# Patient Record
Sex: Male | Born: 1935
Health system: Southern US, Community
[De-identification: ages and names within clinical notes are randomized; demographics above are authoritative.]

## PROBLEM LIST (undated history)

## (undated) DIAGNOSIS — F419 Anxiety disorder, unspecified: Secondary | ICD-10-CM

## (undated) DIAGNOSIS — M545 Low back pain, unspecified: Secondary | ICD-10-CM

## (undated) DIAGNOSIS — N400 Enlarged prostate without lower urinary tract symptoms: Secondary | ICD-10-CM

## (undated) DIAGNOSIS — M1990 Unspecified osteoarthritis, unspecified site: Secondary | ICD-10-CM

## (undated) DIAGNOSIS — I5030 Unspecified diastolic (congestive) heart failure: Secondary | ICD-10-CM

## (undated) DIAGNOSIS — F32A Depression, unspecified: Secondary | ICD-10-CM

## (undated) DIAGNOSIS — E669 Obesity, unspecified: Secondary | ICD-10-CM

## (undated) DIAGNOSIS — R945 Abnormal results of liver function studies: Secondary | ICD-10-CM

## (undated) DIAGNOSIS — I495 Sick sinus syndrome: Secondary | ICD-10-CM

## (undated) DIAGNOSIS — K449 Diaphragmatic hernia without obstruction or gangrene: Secondary | ICD-10-CM

## (undated) DIAGNOSIS — F329 Major depressive disorder, single episode, unspecified: Secondary | ICD-10-CM

## (undated) DIAGNOSIS — R7989 Other specified abnormal findings of blood chemistry: Secondary | ICD-10-CM

## (undated) DIAGNOSIS — I6381 Other cerebral infarction due to occlusion or stenosis of small artery: Secondary | ICD-10-CM

## (undated) DIAGNOSIS — G8929 Other chronic pain: Secondary | ICD-10-CM

## (undated) DIAGNOSIS — I10 Essential (primary) hypertension: Secondary | ICD-10-CM

## (undated) DIAGNOSIS — Z9989 Dependence on other enabling machines and devices: Secondary | ICD-10-CM

## (undated) DIAGNOSIS — D649 Anemia, unspecified: Secondary | ICD-10-CM

## (undated) DIAGNOSIS — I2510 Atherosclerotic heart disease of native coronary artery without angina pectoris: Secondary | ICD-10-CM

## (undated) DIAGNOSIS — G4733 Obstructive sleep apnea (adult) (pediatric): Secondary | ICD-10-CM

## (undated) DIAGNOSIS — M810 Age-related osteoporosis without current pathological fracture: Secondary | ICD-10-CM

## (undated) HISTORY — DX: Anxiety disorder, unspecified: F41.9

## (undated) HISTORY — DX: Unspecified diastolic (congestive) heart failure: I50.30

## (undated) HISTORY — DX: Diaphragmatic hernia without obstruction or gangrene: K44.9

## (undated) HISTORY — DX: Essential (primary) hypertension: I10

## (undated) HISTORY — DX: Anemia, unspecified: D64.9

## (undated) HISTORY — DX: Other specified abnormal findings of blood chemistry: R79.89

## (undated) HISTORY — DX: Obesity, unspecified: E66.9

## (undated) HISTORY — DX: Abnormal results of liver function studies: R94.5

---

## 1996-12-26 HISTORY — PX: CARDIAC CATHETERIZATION: SHX172

## 1999-02-03 ENCOUNTER — Encounter: Admission: RE | Admit: 1999-02-03 | Discharge: 1999-02-03 | Payer: Self-pay | Admitting: Family Medicine

## 1999-02-03 ENCOUNTER — Ambulatory Visit (HOSPITAL_COMMUNITY): Admission: RE | Admit: 1999-02-03 | Discharge: 1999-02-03 | Payer: Self-pay | Admitting: Family Medicine

## 1999-02-15 ENCOUNTER — Ambulatory Visit (HOSPITAL_COMMUNITY): Admission: RE | Admit: 1999-02-15 | Discharge: 1999-02-15 | Payer: Self-pay | Admitting: *Deleted

## 1999-03-02 ENCOUNTER — Encounter: Admission: RE | Admit: 1999-03-02 | Discharge: 1999-03-02 | Payer: Self-pay | Admitting: Sports Medicine

## 1999-07-09 ENCOUNTER — Encounter: Admission: RE | Admit: 1999-07-09 | Discharge: 1999-07-09 | Payer: Self-pay | Admitting: Family Medicine

## 1999-09-08 ENCOUNTER — Encounter: Admission: RE | Admit: 1999-09-08 | Discharge: 1999-09-08 | Payer: Self-pay | Admitting: Family Medicine

## 2000-03-13 ENCOUNTER — Encounter: Admission: RE | Admit: 2000-03-13 | Discharge: 2000-03-13 | Payer: Self-pay | Admitting: Family Medicine

## 2000-04-25 ENCOUNTER — Encounter: Admission: RE | Admit: 2000-04-25 | Discharge: 2000-04-25 | Payer: Self-pay | Admitting: Sports Medicine

## 2000-05-03 ENCOUNTER — Encounter: Payer: Self-pay | Admitting: *Deleted

## 2000-05-03 ENCOUNTER — Encounter: Admission: RE | Admit: 2000-05-03 | Discharge: 2000-05-03 | Payer: Self-pay | Admitting: *Deleted

## 2000-07-05 ENCOUNTER — Encounter: Admission: RE | Admit: 2000-07-05 | Discharge: 2000-07-05 | Payer: Self-pay | Admitting: Family Medicine

## 2000-07-20 ENCOUNTER — Encounter: Admission: RE | Admit: 2000-07-20 | Discharge: 2000-07-20 | Payer: Self-pay | Admitting: Family Medicine

## 2000-10-30 ENCOUNTER — Encounter: Admission: RE | Admit: 2000-10-30 | Discharge: 2000-10-30 | Payer: Self-pay | Admitting: Family Medicine

## 2000-11-20 ENCOUNTER — Encounter: Admission: RE | Admit: 2000-11-20 | Discharge: 2000-11-20 | Payer: Self-pay | Admitting: Family Medicine

## 2000-12-07 ENCOUNTER — Encounter: Admission: RE | Admit: 2000-12-07 | Discharge: 2000-12-07 | Payer: Self-pay | Admitting: Family Medicine

## 2001-05-16 ENCOUNTER — Ambulatory Visit (HOSPITAL_COMMUNITY): Admission: RE | Admit: 2001-05-16 | Discharge: 2001-05-16 | Payer: Self-pay | Admitting: Family Medicine

## 2001-05-16 ENCOUNTER — Encounter: Admission: RE | Admit: 2001-05-16 | Discharge: 2001-05-16 | Payer: Self-pay | Admitting: Family Medicine

## 2001-06-01 ENCOUNTER — Encounter: Payer: Self-pay | Admitting: Family Medicine

## 2001-06-01 ENCOUNTER — Ambulatory Visit (HOSPITAL_COMMUNITY): Admission: RE | Admit: 2001-06-01 | Discharge: 2001-06-01 | Payer: Self-pay | Admitting: *Deleted

## 2001-06-04 ENCOUNTER — Encounter: Admission: RE | Admit: 2001-06-04 | Discharge: 2001-06-04 | Payer: Self-pay | Admitting: Family Medicine

## 2001-07-06 ENCOUNTER — Encounter: Admission: RE | Admit: 2001-07-06 | Discharge: 2001-07-06 | Payer: Self-pay | Admitting: Family Medicine

## 2001-07-09 ENCOUNTER — Encounter: Payer: Self-pay | Admitting: Sports Medicine

## 2001-07-09 ENCOUNTER — Encounter: Admission: RE | Admit: 2001-07-09 | Discharge: 2001-07-09 | Payer: Self-pay | Admitting: Sports Medicine

## 2001-09-03 ENCOUNTER — Encounter: Admission: RE | Admit: 2001-09-03 | Discharge: 2001-09-03 | Payer: Self-pay | Admitting: Family Medicine

## 2001-09-17 ENCOUNTER — Encounter: Admission: RE | Admit: 2001-09-17 | Discharge: 2001-09-17 | Payer: Self-pay | Admitting: Family Medicine

## 2001-10-01 ENCOUNTER — Encounter: Admission: RE | Admit: 2001-10-01 | Discharge: 2001-10-01 | Payer: Self-pay | Admitting: Family Medicine

## 2001-10-09 ENCOUNTER — Encounter: Admission: RE | Admit: 2001-10-09 | Discharge: 2001-10-09 | Payer: Self-pay | Admitting: Family Medicine

## 2001-10-29 ENCOUNTER — Encounter: Admission: RE | Admit: 2001-10-29 | Discharge: 2001-10-29 | Payer: Self-pay | Admitting: Family Medicine

## 2001-11-15 ENCOUNTER — Encounter: Payer: Self-pay | Admitting: Emergency Medicine

## 2001-11-15 ENCOUNTER — Emergency Department (HOSPITAL_COMMUNITY): Admission: EM | Admit: 2001-11-15 | Discharge: 2001-11-15 | Payer: Self-pay | Admitting: Emergency Medicine

## 2001-12-07 ENCOUNTER — Encounter: Admission: RE | Admit: 2001-12-07 | Discharge: 2001-12-07 | Payer: Self-pay | Admitting: Family Medicine

## 2001-12-17 ENCOUNTER — Encounter: Payer: Self-pay | Admitting: Internal Medicine

## 2001-12-17 ENCOUNTER — Ambulatory Visit (HOSPITAL_COMMUNITY): Admission: RE | Admit: 2001-12-17 | Discharge: 2001-12-17 | Payer: Self-pay | Admitting: Internal Medicine

## 2001-12-20 ENCOUNTER — Encounter: Admission: RE | Admit: 2001-12-20 | Discharge: 2001-12-20 | Payer: Self-pay | Admitting: Family Medicine

## 2002-01-29 ENCOUNTER — Ambulatory Visit (HOSPITAL_BASED_OUTPATIENT_CLINIC_OR_DEPARTMENT_OTHER): Admission: RE | Admit: 2002-01-29 | Discharge: 2002-01-29 | Payer: Self-pay | Admitting: Internal Medicine

## 2002-02-20 ENCOUNTER — Encounter: Admission: RE | Admit: 2002-02-20 | Discharge: 2002-02-20 | Payer: Self-pay | Admitting: Family Medicine

## 2002-05-16 ENCOUNTER — Encounter: Admission: RE | Admit: 2002-05-16 | Discharge: 2002-05-16 | Payer: Self-pay | Admitting: Family Medicine

## 2002-05-27 ENCOUNTER — Encounter: Admission: RE | Admit: 2002-05-27 | Discharge: 2002-05-27 | Payer: Self-pay | Admitting: Family Medicine

## 2002-09-30 ENCOUNTER — Encounter: Admission: RE | Admit: 2002-09-30 | Discharge: 2002-09-30 | Payer: Self-pay | Admitting: Sports Medicine

## 2003-03-20 ENCOUNTER — Encounter: Admission: RE | Admit: 2003-03-20 | Discharge: 2003-03-20 | Payer: Self-pay | Admitting: Family Medicine

## 2003-04-11 ENCOUNTER — Encounter: Admission: RE | Admit: 2003-04-11 | Discharge: 2003-04-11 | Payer: Self-pay | Admitting: Family Medicine

## 2003-09-08 ENCOUNTER — Other Ambulatory Visit: Admission: RE | Admit: 2003-09-08 | Discharge: 2003-09-08 | Payer: Self-pay | Admitting: Dermatology

## 2003-12-08 ENCOUNTER — Encounter: Admission: RE | Admit: 2003-12-08 | Discharge: 2003-12-08 | Payer: Self-pay | Admitting: Family Medicine

## 2004-04-01 ENCOUNTER — Encounter: Admission: RE | Admit: 2004-04-01 | Discharge: 2004-04-01 | Payer: Self-pay | Admitting: Family Medicine

## 2004-04-29 ENCOUNTER — Encounter: Admission: RE | Admit: 2004-04-29 | Discharge: 2004-04-29 | Payer: Self-pay | Admitting: Family Medicine

## 2004-05-28 ENCOUNTER — Encounter: Admission: RE | Admit: 2004-05-28 | Discharge: 2004-05-28 | Payer: Self-pay | Admitting: Sports Medicine

## 2004-07-01 ENCOUNTER — Encounter: Admission: RE | Admit: 2004-07-01 | Discharge: 2004-07-01 | Payer: Self-pay | Admitting: Family Medicine

## 2004-08-02 ENCOUNTER — Encounter: Admission: RE | Admit: 2004-08-02 | Discharge: 2004-08-02 | Payer: Self-pay | Admitting: Family Medicine

## 2005-01-19 ENCOUNTER — Ambulatory Visit: Payer: Self-pay | Admitting: Internal Medicine

## 2005-05-31 ENCOUNTER — Ambulatory Visit: Payer: Self-pay | Admitting: Internal Medicine

## 2005-07-07 ENCOUNTER — Other Ambulatory Visit: Admission: RE | Admit: 2005-07-07 | Discharge: 2005-07-07 | Payer: Self-pay | Admitting: Dermatology

## 2005-11-29 ENCOUNTER — Ambulatory Visit: Payer: Self-pay | Admitting: Internal Medicine

## 2005-11-30 ENCOUNTER — Ambulatory Visit: Payer: Self-pay | Admitting: *Deleted

## 2006-05-30 ENCOUNTER — Ambulatory Visit: Payer: Self-pay | Admitting: Internal Medicine

## 2006-11-27 ENCOUNTER — Ambulatory Visit: Payer: Self-pay | Admitting: Internal Medicine

## 2007-02-22 DIAGNOSIS — K449 Diaphragmatic hernia without obstruction or gangrene: Secondary | ICD-10-CM | POA: Insufficient documentation

## 2007-02-22 DIAGNOSIS — I10 Essential (primary) hypertension: Secondary | ICD-10-CM

## 2007-02-22 DIAGNOSIS — D509 Iron deficiency anemia, unspecified: Secondary | ICD-10-CM

## 2007-02-22 DIAGNOSIS — F411 Generalized anxiety disorder: Secondary | ICD-10-CM | POA: Insufficient documentation

## 2007-02-22 DIAGNOSIS — E669 Obesity, unspecified: Secondary | ICD-10-CM

## 2007-11-13 DIAGNOSIS — R0602 Shortness of breath: Secondary | ICD-10-CM

## 2007-11-13 DIAGNOSIS — J309 Allergic rhinitis, unspecified: Secondary | ICD-10-CM | POA: Insufficient documentation

## 2007-11-13 DIAGNOSIS — G4733 Obstructive sleep apnea (adult) (pediatric): Secondary | ICD-10-CM

## 2007-12-18 ENCOUNTER — Encounter: Payer: Self-pay | Admitting: Internal Medicine

## 2007-12-19 ENCOUNTER — Ambulatory Visit: Payer: Self-pay | Admitting: Internal Medicine

## 2008-12-01 ENCOUNTER — Ambulatory Visit: Payer: Self-pay | Admitting: Internal Medicine

## 2008-12-26 HISTORY — PX: CATARACT EXTRACTION W/ INTRAOCULAR LENS  IMPLANT, BILATERAL: SHX1307

## 2009-05-26 ENCOUNTER — Encounter: Admission: RE | Admit: 2009-05-26 | Discharge: 2009-05-26 | Payer: Self-pay | Admitting: Orthopedic Surgery

## 2009-09-01 ENCOUNTER — Telehealth: Payer: Self-pay | Admitting: Internal Medicine

## 2009-09-02 ENCOUNTER — Encounter: Payer: Self-pay | Admitting: Internal Medicine

## 2009-09-14 ENCOUNTER — Emergency Department (HOSPITAL_COMMUNITY): Admission: EM | Admit: 2009-09-14 | Discharge: 2009-09-14 | Payer: Self-pay | Admitting: Emergency Medicine

## 2009-12-15 ENCOUNTER — Ambulatory Visit: Payer: Self-pay | Admitting: Internal Medicine

## 2009-12-17 ENCOUNTER — Telehealth: Payer: Self-pay | Admitting: Internal Medicine

## 2009-12-23 ENCOUNTER — Encounter: Payer: Self-pay | Admitting: Internal Medicine

## 2009-12-28 ENCOUNTER — Telehealth (INDEPENDENT_AMBULATORY_CARE_PROVIDER_SITE_OTHER): Payer: Self-pay | Admitting: *Deleted

## 2010-01-06 ENCOUNTER — Encounter: Payer: Self-pay | Admitting: Internal Medicine

## 2010-02-01 ENCOUNTER — Ambulatory Visit: Payer: Self-pay | Admitting: Cardiology

## 2010-02-01 ENCOUNTER — Encounter: Payer: Self-pay | Admitting: Cardiology

## 2010-02-02 ENCOUNTER — Encounter: Payer: Self-pay | Admitting: Cardiology

## 2010-02-04 ENCOUNTER — Encounter: Payer: Self-pay | Admitting: Cardiology

## 2010-02-15 ENCOUNTER — Encounter: Payer: Self-pay | Admitting: Cardiology

## 2010-02-17 ENCOUNTER — Ambulatory Visit: Payer: Self-pay | Admitting: Cardiology

## 2010-02-19 DIAGNOSIS — I10 Essential (primary) hypertension: Secondary | ICD-10-CM | POA: Insufficient documentation

## 2010-02-19 DIAGNOSIS — R079 Chest pain, unspecified: Secondary | ICD-10-CM | POA: Insufficient documentation

## 2010-02-19 DIAGNOSIS — E876 Hypokalemia: Secondary | ICD-10-CM | POA: Insufficient documentation

## 2010-02-19 DIAGNOSIS — E119 Type 2 diabetes mellitus without complications: Secondary | ICD-10-CM | POA: Insufficient documentation

## 2010-02-19 DIAGNOSIS — I5030 Unspecified diastolic (congestive) heart failure: Secondary | ICD-10-CM | POA: Insufficient documentation

## 2010-02-22 ENCOUNTER — Ambulatory Visit: Payer: Self-pay | Admitting: Cardiology

## 2010-02-22 DIAGNOSIS — I509 Heart failure, unspecified: Secondary | ICD-10-CM | POA: Insufficient documentation

## 2010-12-15 ENCOUNTER — Ambulatory Visit: Payer: Self-pay | Admitting: Internal Medicine

## 2011-01-27 ENCOUNTER — Encounter: Payer: Self-pay | Admitting: Internal Medicine

## 2011-01-27 NOTE — Letter (Signed)
Summary: LMN for CPAP Supplies/[facility]  LMN for CPAP Supplies/[facility]   Imported By: Sherian Rein 01/07/2010 11:50:52  _____________________________________________________________________  External Attachment:    Type:   Image     Comment:   External Document

## 2011-01-27 NOTE — Assessment & Plan Note (Signed)
Summary: NPH D/C [facility] 2/10   Visit Type:  Follow-up Primary Provider:  Dr. Doreen Beam  CC:  follow-up visit.  History of Present Illness: 77 year old Stafford recently admitted with complaints of chest pain. He ruled out for myocardial infarction with serial enzymes. An echocardiogram in February of 2011 showed normal LV function and left atrial enlargement. Note his liver functions were significantly elevated. It was felt that GI evaluation was warranted and receiving with an outpatient Myoview for risk stratification. This was performed on February 17, 2010. There was a fixed inferoseptal defect with normal wall motion but no ischemia. Ejection fraction was 73%. Since then he has dyspnea with more extreme activities but not with routine activities. It is relieved with rest. There is no associated chest pain. There is no orthopnea, PND, pedal edema or exertional chest pain. There is no syncope.  Preventive Screening-Counseling & Management  Alcohol-Tobacco     Smoking Status: never  Current Medications (verified): 1)  Enalapril Maleate 20 Mg  Tabs (Enalapril Maleate) .... Take 1 Tablet By Mouth Two Times A Day 2)  Flomax 0.4 Mg  Cp24 (Tamsulosin Hcl) .... Take 1 Tablet By Mouth Once A Day 3)  Multivitamins   Tabs (Multiple Vitamin) .... Take 1 By Mouth Once Daily 4)  Fish Oil 1000 Mg Caps (Omega-3 Fatty Acids) .... Take 1 Capsule By Mouth Two Times A Day 5)  Aspirin Ec Low Strength 81 Mg  Tbec (Aspirin) .... Take 1 By Mouth Once Daily 6)  Furosemide 40 Mg Tabs (Furosemide) .... Take 1/2 Tab By Mouth Daily 7)  Amlodipine Besylate 5 Mg Tabs (Amlodipine Besylate) .... Take One Tablet By Mouth Daily  Allergies: No Known Drug Allergies  Comments:  Nurse/Medical Assistant: The patient's medications were reviewed with the patient and were updated in the Medication List. Pt brought medication bottles to office visit.  Cyril Loosen, RN, BSN (February 22, 2010 10:56 AM)  Past History:  Past  Medical History: BMET 6/03 wnl, Hiatel hernia, hydrocele, left, pancreatitis, remote history, Rupture of foot tendon summer 2001   ALLERGIC RHINITIS (ICD-477.9) OBSTRUCTIVE SLEEP APNEA (ICD-327.23) OBESITY, NOS (ICD-278.00) HYPERTENSION, BENIGN SYSTEMIC (ICD-401.1) HERNIA, HIATAL, NONCONGENITAL (ICD-553.3) ANXIETY (ICD-300.00) ANEMIA, IRON DEFICIENCY, UNSPEC. (ICD-280.9) elevated liver function tests Diastolic congestive heart failure Diabetes mellitus  Social History: Reviewed history from 12/01/2008 and no changes required. 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  Married with 2 children  Review of Systems       no fevers or chills, productive cough, hemoptysis, dysphasia, odynophagia, melena, hematochezia, dysuria, hematuria, rash, seizure activity, orthopnea, PND, pedal edema, claudication. Remaining systems are negative.   Vital Signs:  Patient profile:   77 year old Stafford Height:      Frank inches Weight:      194.25 pounds Pulse rate:   80 / minute BP sitting:   159 / 90  (left arm) Cuff size:   large  Vitals Entered By: Cyril Loosen, RN, BSN (February 22, 2010 10:51 AM) CC: follow-up visit   Physical Exam  General:  Well-developed well-nourished in no acute distress.  Skin is warm and dry.  HEENT is normal.  Neck is supple. No thyromegaly.  Chest is clear to auscultation with normal expansion.  Cardiovascular exam  is regular rate and rhythm.  Abdominal exam nontender or distended. No masses palpated. Extremities show no edema. neuro grossly intact    Impression & Recommendations:  Problem # 1:  CHF (ICD-428.0) Patient has mild diastolic congestive heart failure. I will  decrease his Lasix to 20 mg p.o. daily. Check be met in one week. If he has worsening shortness of breath or volume excess then we can resume previous dose. The following medications were removed from the medication list:    Hydrochlorothiazide 25 Mg Tabs (Hydrochlorothiazide) .Marland Kitchen... Take 1 tablet by mouth once a day His updated medication list for this problem includes:    Enalapril Maleate 20 Mg Tabs (Enalapril maleate) .Marland Kitchen... Take 1 tablet by mouth two times a day    Aspirin Ec Low Strength 81 Mg Tbec (Aspirin) .Marland Kitchen... Take 1 by mouth once daily    Furosemide 40 Mg Tabs (Furosemide) .Marland Kitchen... Take 1/2 tab by mouth daily    Amlodipine Besylate 5 Mg Tabs (Amlodipine besylate) .Marland Kitchen... Take one tablet by mouth daily  Problem # 2:  CHEST PAIN UNSPECIFIED (ICD-786.50)  No further chest pain. Myoview showed no ischemia. No further workup. His updated medication list for this problem includes:    Enalapril Maleate 20 Mg Tabs (Enalapril maleate) .Marland Kitchen... Take 1 tablet by mouth two times a day    Aspirin Ec Low Strength 81 Mg Tbec (Aspirin) .Marland Kitchen... Take 1 by mouth once daily    Amlodipine Besylate 5 Mg Tabs (Amlodipine besylate) .Marland Kitchen... Take one tablet by mouth daily  Future Orders: T-Basic Metabolic Panel [id number]) ... 03/01/2010  Problem # 3:  HYPERTENSION (ICD-401.9)  Blood pressure elevated but he states his systolic typically runs 130 and diastolic 80. He will follow this and certainly his Norvasc could be increased if needed. I will leave this to his primary care physician. The following medications were removed from the medication list:    Hydrochlorothiazide 25 Mg Tabs (Hydrochlorothiazide) .Marland Kitchen... Take 1 tablet by mouth once a day His updated medication list for this problem includes:    Enalapril Maleate 20 Mg Tabs (Enalapril maleate) .Marland Kitchen... Take 1 tablet by mouth two times a day    Aspirin Ec Low Strength 81 Mg Tbec (Aspirin) .Marland Kitchen... Take 1 by mouth once daily    Furosemide 40 Mg Tabs (Furosemide)  .Marland Kitchen... Take 1/2 tab by mouth daily    Amlodipine Besylate 5 Mg Tabs (Amlodipine besylate) .Marland Kitchen... Take one tablet by mouth daily  Future Orders: T-Basic Metabolic Panel [id number]) ... 03/01/2010  Problem # 4:  DM (ICD-250.00)  His updated medication list for this problem includes:    Enalapril Maleate 20 Mg Tabs (Enalapril maleate) .Marland Kitchen... Take 1 tablet by mouth two times a day    Aspirin Ec Low Strength 81 Mg Tbec (Aspirin) .Marland Kitchen... Take 1 by mouth once daily  Problem # 5:  OBSTRUCTIVE SLEEP APNEA (ICD-327.23)  Patient Instructions: 1)  Decrease Lasix to 20mg  daily 2)  BMET in 1 week 3)  Follow up in  as needed

## 2011-01-27 NOTE — Consult Note (Signed)
Summary: GASTRO CONSULT/ DR. Karilyn Cota  GASTRO CONSULT/ DR. Karilyn Cota   Imported By: Zachary George 02/19/2010 16:10:37  _____________________________________________________________________  External Attachment:    Type:   Image     Comment:   External Document

## 2011-01-27 NOTE — Letter (Signed)
Summary: LMN for CPAP Supplies/[facility]  LMN for CPAP Supplies/[facility]   Imported By: Sherian Rein 12/30/2009 08:06:11  _____________________________________________________________________  External Attachment:    Type:   Image     Comment:   External Document

## 2011-01-27 NOTE — Progress Notes (Signed)
Summary: Letter of Medical Necessity from [facility]  Letter of Medical Necessity from [facility] forwarded to Dr. Maple Hudson. [name]  December 28, 2009 10:55 AM

## 2011-01-27 NOTE — Letter (Signed)
Summary: [facility] D/C DR. VYAS  [facility] D/C DR. VYAS   Imported By: Zachary George 02/19/2010 16:10:00  _____________________________________________________________________  External Attachment:    Type:   Image     Comment:   External Document

## 2011-01-27 NOTE — Consult Note (Signed)
Summary: CARDIOLOGY CONSULT/ [facility]  CARDIOLOGY CONSULT/ [facility]   Imported By: Zachary George 02/19/2010 16:05:44  _____________________________________________________________________  External Attachment:    Type:   Image     Comment:   External Document

## 2011-01-27 NOTE — Assessment & Plan Note (Signed)
Summary: 12 months/apc   Primary Provider/Referring Provider:  Dr. Doreen Beam  CC:  Yearly follow up visit.  History of Present Illness: 12-31-08-allergic rhinitis, OSA Compliant w/ cpap 12. not using fluticasone For cataract surgery Uses  nothing for chest. Walks daily has had flu and pneumovax vaccines.  December 15, 2009- Allergic rhinitis, OSA Since last here had cataract surgery, then slipped and hurt right leg- doing better. Some nasal drip, but no chest congestion, cough or chest pain. Got Flu vax. Compliant with CPAP 12, but skips a night occasionally if too busy to bother. We discussed this.  Feet swell.  December 15, 2010- Allergic rhinitis, OSA Nurse-CC: Yearly follow up visit We noted HBP today and nurse has called his PCP to f/u on this today. [name] attributes it to low back and hip pain.  Hosp summary from 01/2010 reviewed- for chest pain. CXR then showed interstitial edema.  Now aware of nasal congestion and sense of nasal drainage. but little cough or phlegm.    Preventive Screening-Counseling & Management  Alcohol-Tobacco     Smoking Status: never     Passive Smoke Exposure: yes  Current Medications (verified): 1)  Enalapril Maleate 20 Mg  Tabs (Enalapril Maleate) .... Take 1 Tablet By Mouth Two Times A Day 2)  Flomax 0.4 Mg  Cp24 (Tamsulosin Hcl) .... Take 1 Tablet By Mouth Once A Day 3)  Multivitamins   Tabs (Multiple Vitamin) .... Take 1 By Mouth Once Daily 4)  Fish Oil 1000 Mg Caps (Omega-3 Fatty Acids) .... Take 1 Capsule By Mouth Two Times A Day 5)  Aspirin Ec Low Strength 81 Mg  Tbec (Aspirin) .... Take 1 By Mouth Once Daily 6)  Furosemide 40 Mg Tabs (Furosemide) .... Take 1/2 Tab By Mouth Daily 7)  Benicar 40 Mg Tabs (Olmesartan Medoxomil) .... Take 1 By Mouth Once Daily 8)  Metoprolol Tartrate 50 Mg Tabs (Metoprolol Tartrate) .... Take 1 By Mouth Once Daily 9)  Klor-Con 10 10 Meq Cr-Tabs (Potassium Chloride) .... Take 1 By Mouth Once  Daily  Allergies (verified): No Known Drug Allergies  Past History:  Past Medical History: Last updated: 02/22/2010 BMET 6/03 wnl, Hiatel hernia, hydrocele, left, pancreatitis, remote history, Rupture of foot tendon summer 2001   ALLERGIC RHINITIS (ICD-477.9) OBSTRUCTIVE SLEEP APNEA (ICD-327.23) OBESITY, NOS (ICD-278.00) HYPERTENSION, BENIGN SYSTEMIC (ICD-401.1) HERNIA, HIATAL, NONCONGENITAL (ICD-553.3) ANXIETY (ICD-300.00) ANEMIA, IRON DEFICIENCY, UNSPEC. (ICD-280.9) elevated liver function tests Diastolic congestive heart failure Diabetes mellitus  Past Surgical History: Last updated: 12/15/2009 Cardiac Cath  1998 WNL - 11/25/1997, Colonoscopy 2/00 hyperplastic polyp -, dental extraction -, Echo EF 55-65%; no WMA; abnormal LV relaxation - 06/04/2001, mastoidectomy, left -, OSA study with >100 episodes apnea - 02/20/2002, scrotal ultrasound, Left hydrocele - 04/11/2000, Spirometry: Repeated 12/2001   WNL - 06/04/2001 Cataract surgery-2010  Family History: Last updated: December 31, 2008 dad died at 50 from CVA, mom died at 19 with heart disease Sibling1; living age 70 Sibling 2; living age 85  Social History: Last updated: 12/31/2008 semi-retired; 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  Married with 2 children  Risk Factors: Smoking Status: never (12/15/2010) Passive Smoke Exposure: yes (12/15/2010)  Review of Systems      See HPI  The patient complains of shortness of breath with activity and non-productive cough.  The patient denies shortness of breath at rest, productive cough, coughing up blood, chest pain, irregular heartbeats, acid heartburn, indigestion, loss of appetite, weight change, abdominal  pain, difficulty swallowing, sore throat, tooth/dental problems, nasal congestion/difficulty breathing through nose, and sneezing.    Vital Signs:  Patient profile:   77 year old male Height:      72 inches Weight:      202.50 pounds BMI:     27.56 O2 Sat:      98 % on Room air Pulse rate:   56 / minute BP sitting:   202 / 100  (left arm) Cuff size:   regular  Vitals Entered By: Reynaldo Minium CMA (December 15, 2010 11:54 AM)  O2 Flow:  Room air CC: Yearly follow up visit Comments BP reading was called to Dr. Sherril Croon office and confirmed that patient is being treated for Hypertension; an appt was made for pt to follow up with PCP today at 2:15pm.[facility] CMA  December 15, 2010 11:54 AM    Physical Exam  Additional Exam:  General: A/Ox3; pleasant and cooperative, NAD, obese SKIN: no rash, lesions NODES: no lymphadenopathy HEENT: [name]/AT, EOM- WNL, Conjuctivae- clear, PERRLA, TM-WNL, Nose- mucus bridging, Throat- clear and wnl. Mallampati   III NECK: Supple w/ fair ROM, JVD- none, normal carotid impulses w/o bruits Thyroid- normal to palpation CHEST: Clear to P&A HEART: RRR, no m/g/r heard ABDOMEN: Soft and nl;  ZOX:WRUE, nl pulses, 2+ edema both feet NEURO: Grossly intact to observation      Impression & Recommendations:  Problem # 1:  ALLERGIC RHINITIS (ICD-477.9)  Increased nasal congestion may include some allergy but is mostly dry heat and irritation. He resists medications, but is willing to try a sample of nasonex.   Problem # 2:  DYSPNEA (ICD-786.05) cardiopulmonary and obesity with deconditioning factors. i encouraged weight loss and regular daily walking.   Medications Added to Medication List This Visit: 1)  Benicar 40 Mg Tabs (Olmesartan medoxomil) .... Take 1 by mouth once daily 2)  Metoprolol Tartrate 50 Mg Tabs (Metoprolol tartrate) .... Take 1 by mouth once daily 3)  Klor-con 10 10 Meq Cr-tabs (Potassium chloride) .... Take 1 by mouth once daily  Other  Orders: Est. Patient Level III (45409)  Patient Instructions: 1)  Please schedule a follow-up appointment in 1 year.  Please call sooner as needed. 2)  Try sample Nasonex nasal steroid spray 3)      1-2 puffs in each nostril once every day at bedtime.

## 2011-01-27 NOTE — Miscellaneous (Signed)
Summary: Nuclear stress test  Clinical Lists Changes  Observations: Added new observation of NUCST CONC: Nuclear Cardiology Conclusion : Normal LV perfusion.           Normal exercise myocardial perfusion with Tc-30m sestamibi imaging. Stress testing induced no chest pain symptoms and no ECG changes consistent with ischemia. Elevated TID ratio, but visually it appears normal. Overall appears to be a low risk study. Global left ventricular systolic function was normal, with an EF of 73%. In addition, there was normal wall motion.  There was a medium, fixed, mid to basal- inferoseptal defect associated with normal wall motion.         (02/17/2010 13:05)      Nuclear ETT  Procedure date:  02/17/2010  Findings:      Nuclear Cardiology Conclusion : Normal LV perfusion.           Normal exercise myocardial perfusion with Tc-30m sestamibi imaging. Stress testing induced no chest pain symptoms and no ECG changes consistent with ischemia. Elevated TID ratio, but visually it appears normal. Overall appears to be a low risk study. Global left ventricular systolic function was normal, with an EF of 73%. In addition, there was normal wall motion.  There was a medium, fixed, mid to basal- inferoseptal defect associated with normal wall motion.          Appended Document: Nuclear stress test Have i seen this pt? myoview appears to be normal.  Appended Document: Nuclear stress test Pt has not been seen in our office yet. He has pending appt for 02/22/10 for post hospital follow up.

## 2011-02-10 NOTE — Letter (Signed)
Summary: LMN for CPAP Supplies/[facility]   LMN for CPAP Supplies/[facility]   Imported By: Sherian Rein 02/03/2011 11:45:53  _____________________________________________________________________  External Attachment:    Type:   Image     Comment:   External Document

## 2011-04-01 LAB — URINALYSIS, ROUTINE W REFLEX MICROSCOPIC
Glucose, UA: NEGATIVE mg/dL
Hgb urine dipstick: NEGATIVE
Ketones, ur: 15 mg/dL — AB
Protein, ur: NEGATIVE mg/dL
Urobilinogen, UA: 0.2 mg/dL (ref 0.0–1.0)

## 2011-05-13 NOTE — Assessment & Plan Note (Signed)
[facility]                             PULMONARY OFFICE NOTE   KAESON, KLEINERT                       MRN:          [id number]  DATE:11/30/2006                            DOB:          09/08/1933    PULMONARY FOLLOWUP   PROBLEMS:  1. Dyspnea.  2. Obstructive sleep apnea.  3. Allergic rhinitis.  4. Hiatal hernia.   HISTORY:  He is using CPAP comfortably at 12 CWP through [facility].  During the day, he says, he is breathing well.  He has noticed  his legs have been swelling some without pain.  He tries to minimize  dietary salt.  Otherwise, he has been quite stable.   MEDICATIONS:  1. Hydrochlorothiazide 25 mg.  2. Enalapril 20 mg b.i.d.  3. CPAP at 12 CWP.  4. Aspirin.  5. Omega 3.  6. Flomax 0.4 mg.  7. Multivitamin.   No medication allergy.   OBJECTIVE:  Weight 214 pounds, BP 142/82, pulse regular 77, room air  saturation 96%.  He is talkative in no distress.  Lung fields are quiet and clear.  I do not hear rales or wheeze.  No adenopathy.  No neck vein distension or peripheral edema.  HEART:  Sounds are regular without murmur.   IMPRESSION:  Allergic rhinitis and sleep apnea are well controlled.  I  am not sure about the basis for peripheral edema, and we discussed this.  He has had ankle edema in the past, and may just be seeing the effect of  some dietary indiscretion at Thanksgiving.   PLAN:  1. Hydrochlorothiazide 25 mg daily p.r.n. for occasional use only for      leg edema with clear instruction that I want him to followup on      this issue with Dr. Sherril Croon.  2. Schedule pulmonary return at his request in 1 year to check on his      sleep apnea, maintain contact for home care revision as necessary.     Clinton D. Maple Hudson, MD, Tonny Bollman, FACP  Electronically Signed    CDY/MedQ  DD: 11/27/2006  DT: 11/28/2006  Job #: [id number]   cc:   Doreen Beam

## 2011-12-15 ENCOUNTER — Ambulatory Visit (INDEPENDENT_AMBULATORY_CARE_PROVIDER_SITE_OTHER): Payer: Medicare Other | Admitting: Internal Medicine

## 2011-12-15 ENCOUNTER — Encounter: Payer: Self-pay | Admitting: Internal Medicine

## 2011-12-15 VITALS — BP 140/80 | HR 55 | Ht 72.0 in | Wt 207.0 lb

## 2011-12-15 DIAGNOSIS — G4733 Obstructive sleep apnea (adult) (pediatric): Secondary | ICD-10-CM

## 2011-12-15 DIAGNOSIS — R0602 Shortness of breath: Secondary | ICD-10-CM

## 2011-12-15 NOTE — Progress Notes (Signed)
12/15/11- 78 yoM never smoker followed for Allergic rhinitis, hx OSA, dyspnea, complicated by DM, iron def anemia, HBP, CHF LOV-12/15/10 Had flu vax. His degenerative disk disease is his major limitation- inoperable per NSGY. Comfortable with CPAP 12/ [facility].  Notes marked dyspnea bending over- crowds him. Chronic edema- tried diuretics.    ROS-see HPI Constitutional:   No-   weight loss, night sweats, fevers, chills, fatigue, lassitude. HEENT:   No-  headaches, difficulty swallowing, tooth/dental problems, sore throat,       No-  sneezing, itching, ear ache, nasal congestion, post nasal drip,  CV:  No-   chest pain, orthopnea, PND, swelling in lower extremities, anasarca,  dizziness, palpitations Resp: + shortness of breath with exertion or at rest.              No-   productive cough,  No non-productive cough,  No- coughing up of blood.              No-   change in color of mucus.  No- wheezing.   Skin: No-   rash or lesions. GI:  No-   heartburn, indigestion, abdominal pain, nausea, vomiting, diarrhea,                 change in bowel habits, loss of appetite GU: No-   dysuria, change in color of urine, no urgency or frequency.  No- flank pain. MS:  +   joint pain or swelling.  + decreased range of motion.  + back pain. Neuro-     nothing unusual Psych:  No- change in mood or affect. No depression or anxiety.  No memory loss.  OBJ General- Alert, Oriented, Affect-appropriate, Distress- none acute, rolling walker, back brace, obese Skin- rash-none, lesions- none, excoriation- none Lymphadenopathy- none Head- atraumatic            Eyes- Gross vision intact, PERRLA, conjunctivae clear secretions            Ears- Hearing, canals-normal            Nose- Clear, no-Septal dev, mucus, polyps, erosion, perforation             Throat- Mallampati III-IV , mucosa clear , drainage- none, tonsils- atrophic Neck- flexible , trachea midline, no stridor , thyroid nl, carotid no bruit Chest -  symmetrical excursion , unlabored           Heart/CV- RRR , no murmur , no gallop  , no rub, nl s1 s2                           - JVD- none , edema- 3+, stasis changes- none, varices- none           Lung- clear to P&A, wheeze- none, cough- none , dullness-none, rub- none           Chest wall-  Abd- tender-no, distended-no, bowel sounds-present, HSM- no Br/ Gen/ Rectal- Not done, not indicated Extrem- cyanosis- none, clubbing, none, atrophy- none, strength- nl Neuro- grossly intact to observation

## 2011-12-15 NOTE — Patient Instructions (Signed)
You are doing very well with the CPAP set at 12. Next time you are looking to change masks, ask [facility] to show you the nasal pillows type masks.  I agree that keeping your weight down does help keep your belly from crowding your breathing.

## 2011-12-19 NOTE — Assessment & Plan Note (Signed)
Good compliance and control using CPAP 12 all night every night.

## 2011-12-19 NOTE — Assessment & Plan Note (Signed)
Multifactorial- diastolic CHF, obesity, deconditioning.

## 2012-03-02 HISTORY — PX: STOMACH SURGERY: SHX791

## 2012-03-06 ENCOUNTER — Other Ambulatory Visit: Payer: Self-pay | Admitting: Internal Medicine

## 2012-03-06 DIAGNOSIS — G4733 Obstructive sleep apnea (adult) (pediatric): Secondary | ICD-10-CM

## 2012-03-06 NOTE — Progress Notes (Signed)
03/06/12- [name]'s requests script for replacement CPAP mask and supplies for his OSA.

## 2012-12-06 ENCOUNTER — Ambulatory Visit (INDEPENDENT_AMBULATORY_CARE_PROVIDER_SITE_OTHER): Payer: Medicare Other | Admitting: Internal Medicine

## 2012-12-06 ENCOUNTER — Encounter: Payer: Self-pay | Admitting: Internal Medicine

## 2012-12-06 ENCOUNTER — Ambulatory Visit: Payer: Medicare Other | Admitting: Internal Medicine

## 2012-12-06 ENCOUNTER — Ambulatory Visit (INDEPENDENT_AMBULATORY_CARE_PROVIDER_SITE_OTHER)
Admission: RE | Admit: 2012-12-06 | Discharge: 2012-12-06 | Disposition: A | Discharge status: Home / Self Care | Payer: Medicare Other | Source: Ambulatory Visit | Attending: Internal Medicine | Admitting: Internal Medicine

## 2012-12-06 VITALS — BP 124/64 | HR 55 | Ht 72.0 in | Wt 196.6 lb

## 2012-12-06 DIAGNOSIS — R609 Edema, unspecified: Secondary | ICD-10-CM

## 2012-12-06 DIAGNOSIS — J309 Allergic rhinitis, unspecified: Secondary | ICD-10-CM

## 2012-12-06 DIAGNOSIS — R0602 Shortness of breath: Secondary | ICD-10-CM

## 2012-12-06 DIAGNOSIS — E669 Obesity, unspecified: Secondary | ICD-10-CM

## 2012-12-06 DIAGNOSIS — G4733 Obstructive sleep apnea (adult) (pediatric): Secondary | ICD-10-CM

## 2012-12-06 DIAGNOSIS — I5030 Unspecified diastolic (congestive) heart failure: Secondary | ICD-10-CM

## 2012-12-06 NOTE — Assessment & Plan Note (Signed)
Not a peak pollen season, but he isn't indicating discomfort now.

## 2012-12-06 NOTE — Patient Instructions (Addendum)
Order- CXR   Dx peripheral edema  Order-  DME [facility]   ONOX  Room air, off CPAP    Dx OSA      We will go over these on return and discuss where things stand with the CPAP.

## 2012-12-06 NOTE — Assessment & Plan Note (Signed)
Despite his ankle edema, he says breathing is comfortable. Arthritis problems are much more limiting.

## 2012-12-06 NOTE — Assessment & Plan Note (Signed)
He is too uncomfortable to cope with CPAP. Before pressing the issue, I will try to reassess status by checking ONOX off CPAP.

## 2012-12-06 NOTE — Progress Notes (Signed)
12/15/11- 78 yoM never smoker followed for Allergic rhinitis, hx OSA, dyspnea, complicated by DM, iron def anemia, HBP, CHF LOV-12/15/10 Had flu vax. His degenerative disk disease is his major limitation- inoperable per NSGY. Comfortable with CPAP 12/ [facility].  Notes marked dyspnea bending over- crowds him. Chronic edema- tried diuretics.   12/06/12- 79 yoM never smoker followed for Allergic rhinitis, hx OSA, dyspnea, complicated by DM, iron def anemia, HBP, CHF,  Degen Disk Dis FOLLOWS RUE:AVWUJW not sleeping well overall-recently had stomach surgery Reports laparotomy at [facility] in March for what may have been a volvulus- I don't have those records. No respiratory complications known to patient. OSA- not using CPAP regularly. Cites unable to lie comfortably to sleep due to chronic hip and back pain from know inoperable arthritis and disk disease, as well as frequent nocturia.  Rhinitis not big problem. Little dyspnea with no cough or wheeze. Feet have swollen for 2-3 years. His PCP manages this with lasix. Can't keep feet elevated enough of time to help.  Note weight 2011 was 202 lbs, now 196 lbs.  ROS-see HPI Constitutional:   No-  acute weight loss, night sweats, fevers, chills, bothersome fatigue, lassitude. HEENT:   No-  headaches, difficulty swallowing, tooth/dental problems, sore throat,       No-  sneezing, itching, ear ache, nasal congestion, post nasal drip,  CV:  No-   chest pain, orthopnea, PND, +swelling in lower extremities, No-anasarca,  dizziness, palpitations Resp:  No change in mild chronic shortness of breath with exertion or at rest.              No-   productive cough,  No non-productive cough,  No- coughing up of blood.              No-   change in color of mucus.  No- wheezing.   Skin: No-   rash or lesions. GI:  No-   heartburn, indigestion, abdominal pain, nausea, vomiting,  GU:  MS:  +   joint pain or swelling.  + decreased range of motion.  + back pain. Neuro-      nothing unusual Psych:  No- change in mood or affect. No depression or anxiety.  No memory loss.  OBJ General- Alert, Oriented, Affect-appropriate, Distress- none acute, rolling walker,  Skin- rash-none, lesions- none, excoriation- none Lymphadenopathy- none Head- atraumatic            Eyes- Gross vision intact, PERRLA, conjunctivae clear secretions            Ears- Hearing aid            Nose- Clear, no-Septal dev, mucus, polyps, erosion, perforation             Throat- Mallampati III-IV , mucosa clear , drainage- none, tonsils- atrophic. Own teeth Neck- flexible , trachea midline, no stridor , thyroid nl, carotid no bruit Chest - symmetrical excursion , unlabored           Heart/CV- RRR , no murmur , no gallop  , no rub, nl s1 s2                           - JVD- none , edema- 3-4+, stasis changes- none, varices- none           Lung- clear to P&A, wheeze- none, cough- none , dullness-none, rub- none. No rales heard           Chest wall-  Abd-  Br/ Gen/ Rectal- Not done, not indicated Extrem- cyanosis- none, clubbing, none, atrophy- none, strength- nl Neuro- grossly intact to observation

## 2012-12-06 NOTE — Assessment & Plan Note (Signed)
This is being managed elsewhere, but is probably the primary reason for his chronic ankle edema, made worse by prolonged sitting and limited mobility.

## 2012-12-06 NOTE — Assessment & Plan Note (Signed)
He has lost a few lbs over the last few years. He is carrying several extra lbs of water in his legs.

## 2012-12-25 NOTE — Progress Notes (Signed)
Quick Note:  Called, spoke with pt. Informed him cxr results and recs per Dr. Maple Hudson. He verbalized understanding. ______

## 2013-01-17 ENCOUNTER — Ambulatory Visit (INDEPENDENT_AMBULATORY_CARE_PROVIDER_SITE_OTHER): Payer: Medicare Other | Admitting: Internal Medicine

## 2013-01-17 ENCOUNTER — Encounter: Payer: Self-pay | Admitting: Internal Medicine

## 2013-01-17 VITALS — BP 158/100 | HR 49 | Ht 72.0 in | Wt 195.6 lb

## 2013-01-17 DIAGNOSIS — R609 Edema, unspecified: Secondary | ICD-10-CM

## 2013-01-17 DIAGNOSIS — R600 Localized edema: Secondary | ICD-10-CM

## 2013-01-17 DIAGNOSIS — G4733 Obstructive sleep apnea (adult) (pediatric): Secondary | ICD-10-CM

## 2013-01-17 NOTE — Patient Instructions (Addendum)
Please call us as needed

## 2013-01-17 NOTE — Progress Notes (Signed)
12/15/11- 78 yoM never smoker followed for Allergic rhinitis, hx OSA, dyspnea, complicated by DM, iron def anemia, HBP, CHF LOV-12/15/10 Had flu vax. His degenerative disk disease is his major limitation- inoperable per NSGY. Comfortable with CPAP 12/ [facility].  Notes marked dyspnea bending over- crowds him. Chronic edema- tried diuretics.   12/06/12- 79 yoM never smoker followed for Allergic rhinitis, hx OSA, dyspnea, complicated by DM, iron def anemia, HBP, CHF,  Degen Disk Dis FOLLOWS [id number] not sleeping well overall-recently had stomach surgery Reports laparotomy at [facility] in March for what may have been a volvulus- I don't have those records. No respiratory complications known to patient. OSA- not using CPAP regularly. Cites unable to lie comfortably to sleep due to chronic hip and back pain from known inoperable arthritis and disk disease, as well as frequent nocturia.  Rhinitis not big problem. Little dyspnea with no cough or wheeze. Feet have swollen for 2-3 years. His PCP manages this with lasix. Can't keep feet elevated enough of time to help.  Note weight 2011 was 202 lbs, now 196 lbs.  01/17/13- 79 yoM never smoker followed for Allergic rhinitis, hx OSA, dyspnea, complicated by DM, iron def anemia, HBP, CHF,  Degen Disk Dis FOLLOWS FOR: not using CPAP; [facility]  faxing ONO results to [place] [facility] 12/26/2012- did not qualify for O2 with sleep.  He still occasionally uses CPAP but says he has too much arthritis and back pain. Because of this he changes position and is up and down between chair in bed a lot during the night. CPAP was too difficult. CXR 12/25/12 IMPRESSION:  No acute cardiopulmonary abnormality seen.  Original Report Authenticated By: Lupita Raider., M.D.   ROS-see HPI Constitutional:   No-  acute weight loss, night sweats, fevers, chills,  fatigue, lassitude. HEENT:   No-  headaches, difficulty swallowing, tooth/dental problems, sore throat,       No-   sneezing, itching, ear ache, nasal congestion, post nasal drip,  CV:  No-   chest pain, orthopnea, PND, +swelling in lower extremities, No-anasarca,  dizziness, palpitations Resp:  No change in mild chronic shortness of breath with exertion or at rest.              No-   productive cough,  No non-productive cough,  No- coughing up of blood.              No-   change in color of mucus.  No- wheezing.   Skin: No-   rash or lesions. GI:  No-   heartburn, indigestion, abdominal pain, nausea, vomiting,  GU:  MS:  +   joint pain or swelling.  + decreased range of motion.  + back pain. Neuro-     nothing unusual Psych:  No- change in mood or affect. No depression or anxiety.  No memory loss.  OBJ General- Alert, Oriented, Affect-appropriate, Distress- none acute, rolling walker,  Skin- rash-none, lesions- none, excoriation- none Lymphadenopathy- none Head- atraumatic            Eyes- Gross vision intact, PERRLA, conjunctivae clear secretions            Ears- Hearing aid            Nose- Clear, no-Septal dev, mucus, polyps, erosion, perforation             Throat- Mallampati III-IV , mucosa clear , drainage- none, tonsils- atrophic. Own teeth Neck- flexible , trachea midline, no stridor , thyroid nl, carotid  no bruit Chest - symmetrical excursion , unlabored           Heart/CV- RRR , no murmur , no gallop  , no rub, nl s1 s2                           - JVD- none , edema- 3-4+, stasis changes- none, varices- none           Lung- clear to P&A, wheeze- none, cough- none , dullness-none, rub- none. No rales heard           Chest wall-  Abd-  Br/ Gen/ Rectal- Not done, not indicated Extrem- cyanosis- none, clubbing, none, atrophy- none, strength- nl Neuro- grossly intact to observation

## 2013-01-21 ENCOUNTER — Encounter: Payer: Self-pay | Admitting: Internal Medicine

## 2013-01-27 DIAGNOSIS — R609 Edema, unspecified: Secondary | ICD-10-CM | POA: Insufficient documentation

## 2013-01-27 NOTE — Assessment & Plan Note (Signed)
He has had peripheral edema for a long time. I don't know how much may be related to his cardiac status, peripheral venous insufficiency, or fluid overload.

## 2013-01-27 NOTE — Assessment & Plan Note (Signed)
He seems to be doing well from a sleep apnea standpoint. He wants [place] to be available for CPAP followup should he neede it more in the future.

## 2013-09-10 ENCOUNTER — Observation Stay (HOSPITAL_COMMUNITY)
Admission: AD | Admit: 2013-09-10 | Discharge: 2013-09-14 | Disposition: A | Discharge status: Home / Self Care | Payer: Medicare Other | Source: Ambulatory Visit | Attending: Cardiology | Admitting: Cardiology

## 2013-09-10 ENCOUNTER — Encounter: Payer: Self-pay | Admitting: Cardiology

## 2013-09-10 ENCOUNTER — Encounter (HOSPITAL_COMMUNITY): Payer: Self-pay | Admitting: General Practice

## 2013-09-10 ENCOUNTER — Ambulatory Visit (INDEPENDENT_AMBULATORY_CARE_PROVIDER_SITE_OTHER): Payer: Medicare Other | Admitting: Cardiology

## 2013-09-10 ENCOUNTER — Observation Stay (HOSPITAL_COMMUNITY): Payer: Medicare Other

## 2013-09-10 VITALS — BP 141/77 | HR 40 | Ht 73.0 in | Wt 195.8 lb

## 2013-09-10 DIAGNOSIS — I471 Supraventricular tachycardia, unspecified: Secondary | ICD-10-CM

## 2013-09-10 DIAGNOSIS — Z8673 Personal history of transient ischemic attack (TIA), and cerebral infarction without residual deficits: Secondary | ICD-10-CM | POA: Insufficient documentation

## 2013-09-10 DIAGNOSIS — I10 Essential (primary) hypertension: Secondary | ICD-10-CM | POA: Diagnosis present

## 2013-09-10 DIAGNOSIS — D509 Iron deficiency anemia, unspecified: Secondary | ICD-10-CM | POA: Diagnosis present

## 2013-09-10 DIAGNOSIS — G9340 Encephalopathy, unspecified: Secondary | ICD-10-CM

## 2013-09-10 DIAGNOSIS — I495 Sick sinus syndrome: Principal | ICD-10-CM

## 2013-09-10 DIAGNOSIS — I440 Atrioventricular block, first degree: Secondary | ICD-10-CM | POA: Insufficient documentation

## 2013-09-10 DIAGNOSIS — I5030 Unspecified diastolic (congestive) heart failure: Secondary | ICD-10-CM | POA: Insufficient documentation

## 2013-09-10 DIAGNOSIS — E119 Type 2 diabetes mellitus without complications: Secondary | ICD-10-CM | POA: Diagnosis present

## 2013-09-10 DIAGNOSIS — E876 Hypokalemia: Secondary | ICD-10-CM | POA: Insufficient documentation

## 2013-09-10 DIAGNOSIS — G4733 Obstructive sleep apnea (adult) (pediatric): Secondary | ICD-10-CM | POA: Insufficient documentation

## 2013-09-10 DIAGNOSIS — R55 Syncope and collapse: Secondary | ICD-10-CM

## 2013-09-10 DIAGNOSIS — I6350 Cerebral infarction due to unspecified occlusion or stenosis of unspecified cerebral artery: Secondary | ICD-10-CM | POA: Insufficient documentation

## 2013-09-10 DIAGNOSIS — I509 Heart failure, unspecified: Secondary | ICD-10-CM | POA: Insufficient documentation

## 2013-09-10 DIAGNOSIS — Z6827 Body mass index (BMI) 27.0-27.9, adult: Secondary | ICD-10-CM | POA: Insufficient documentation

## 2013-09-10 DIAGNOSIS — Z794 Long term (current) use of insulin: Secondary | ICD-10-CM | POA: Insufficient documentation

## 2013-09-10 DIAGNOSIS — R001 Bradycardia, unspecified: Secondary | ICD-10-CM

## 2013-09-10 DIAGNOSIS — I519 Heart disease, unspecified: Secondary | ICD-10-CM

## 2013-09-10 DIAGNOSIS — E669 Obesity, unspecified: Secondary | ICD-10-CM | POA: Insufficient documentation

## 2013-09-10 DIAGNOSIS — R4182 Altered mental status, unspecified: Secondary | ICD-10-CM | POA: Diagnosis present

## 2013-09-10 DIAGNOSIS — I2789 Other specified pulmonary heart diseases: Secondary | ICD-10-CM | POA: Insufficient documentation

## 2013-09-10 DIAGNOSIS — Z79899 Other long term (current) drug therapy: Secondary | ICD-10-CM | POA: Insufficient documentation

## 2013-09-10 DIAGNOSIS — I498 Other specified cardiac arrhythmias: Secondary | ICD-10-CM

## 2013-09-10 DIAGNOSIS — R0602 Shortness of breath: Secondary | ICD-10-CM | POA: Insufficient documentation

## 2013-09-10 DIAGNOSIS — I2510 Atherosclerotic heart disease of native coronary artery without angina pectoris: Secondary | ICD-10-CM

## 2013-09-10 DIAGNOSIS — D72829 Elevated white blood cell count, unspecified: Secondary | ICD-10-CM | POA: Insufficient documentation

## 2013-09-10 DIAGNOSIS — I6381 Other cerebral infarction due to occlusion or stenosis of small artery: Secondary | ICD-10-CM

## 2013-09-10 HISTORY — DX: Dependence on other enabling machines and devices: Z99.89

## 2013-09-10 HISTORY — DX: Age-related osteoporosis without current pathological fracture: M81.0

## 2013-09-10 HISTORY — DX: Low back pain: M54.5

## 2013-09-10 HISTORY — DX: Low back pain, unspecified: M54.50

## 2013-09-10 HISTORY — DX: Unspecified osteoarthritis, unspecified site: M19.90

## 2013-09-10 HISTORY — DX: Obstructive sleep apnea (adult) (pediatric): G47.33

## 2013-09-10 HISTORY — DX: Depression, unspecified: F32.A

## 2013-09-10 HISTORY — DX: Major depressive disorder, single episode, unspecified: F32.9

## 2013-09-10 HISTORY — DX: Sick sinus syndrome: I49.5

## 2013-09-10 HISTORY — DX: Other cerebral infarction due to occlusion or stenosis of small artery: I63.81

## 2013-09-10 HISTORY — DX: Benign prostatic hyperplasia without lower urinary tract symptoms: N40.0

## 2013-09-10 HISTORY — DX: Atherosclerotic heart disease of native coronary artery without angina pectoris: I25.10

## 2013-09-10 HISTORY — DX: Other chronic pain: G89.29

## 2013-09-10 LAB — CBC WITH DIFFERENTIAL/PLATELET
Basophils Absolute: 0 10*3/uL (ref 0.0–0.1)
HCT: 41.7 % (ref 39.0–52.0)
Hemoglobin: 15.2 g/dL (ref 13.0–17.0)
Lymphocytes Relative: 24 % (ref 12–46)
Monocytes Absolute: 0.7 10*3/uL (ref 0.1–1.0)
Monocytes Relative: 11 % (ref 3–12)
Neutro Abs: 4.1 10*3/uL (ref 1.7–7.7)
Neutrophils Relative %: 65 % (ref 43–77)
WBC: 6.4 10*3/uL (ref 4.0–10.5)

## 2013-09-10 LAB — COMPREHENSIVE METABOLIC PANEL
ALT: 11 U/L (ref 0–53)
AST: 17 U/L (ref 0–37)
Albumin: 3.9 g/dL (ref 3.5–5.2)
Calcium: 9.8 mg/dL (ref 8.4–10.5)
Sodium: 139 mEq/L (ref 135–145)
Total Protein: 7.5 g/dL (ref 6.0–8.3)

## 2013-09-10 LAB — PROTIME-INR: INR: 1.07 (ref 0.00–1.49)

## 2013-09-10 MED ORDER — ENOXAPARIN SODIUM 40 MG/0.4ML ~~LOC~~ SOLN
40.0000 mg | SUBCUTANEOUS | Status: DC
  Administered 2013-09-10 – 2013-09-12 (×3): 40 mg via SUBCUTANEOUS
  Filled 2013-09-10 (×6): qty 0.4

## 2013-09-10 MED ORDER — ENALAPRIL MALEATE 20 MG PO TABS
20.0000 mg | ORAL_TABLET | Freq: Two times a day (BID) | ORAL | Status: DC
  Administered 2013-09-10 – 2013-09-13 (×5): 20 mg via ORAL
  Filled 2013-09-10 (×10): qty 1

## 2013-09-10 MED ORDER — SODIUM CHLORIDE 0.9 % IJ SOLN
3.0000 mL | INTRAMUSCULAR | Status: DC | PRN
  Administered 2013-09-14 (×2): 3 mL via INTRAVENOUS

## 2013-09-10 MED ORDER — ALPRAZOLAM 0.25 MG PO TABS: 0.2500 mg | ORAL_TABLET | Freq: Two times a day (BID) | ORAL | Status: DC | PRN

## 2013-09-10 MED ORDER — ADULT MULTIVITAMIN W/MINERALS CH
1.0000 | ORAL_TABLET | Freq: Every day | ORAL | Status: DC
  Administered 2013-09-11 – 2013-09-13 (×2): 1 via ORAL
  Filled 2013-09-10 (×4): qty 1

## 2013-09-10 MED ORDER — OMEGA-3-ACID ETHYL ESTERS 1 G PO CAPS
1.0000 g | ORAL_CAPSULE | Freq: Two times a day (BID) | ORAL | Status: DC
  Administered 2013-09-10 – 2013-09-13 (×4): 1 g via ORAL
  Filled 2013-09-10 (×10): qty 1

## 2013-09-10 MED ORDER — POTASSIUM CHLORIDE CRYS ER 10 MEQ PO TBCR
10.0000 meq | EXTENDED_RELEASE_TABLET | Freq: Every day | ORAL | Status: DC
  Administered 2013-09-10 – 2013-09-11 (×2): 10 meq via ORAL
  Filled 2013-09-10 (×3): qty 1

## 2013-09-10 MED ORDER — ZOLPIDEM TARTRATE 5 MG PO TABS: 5.0000 mg | ORAL_TABLET | Freq: Every evening | ORAL | Status: DC | PRN

## 2013-09-10 MED ORDER — TAMSULOSIN HCL 0.4 MG PO CAPS
0.4000 mg | ORAL_CAPSULE | Freq: Every day | ORAL | Status: DC
  Administered 2013-09-10 – 2013-09-13 (×3): 0.4 mg via ORAL
  Filled 2013-09-10 (×6): qty 1

## 2013-09-10 MED ORDER — HYDRALAZINE HCL 20 MG/ML IJ SOLN
10.0000 mg | INTRAMUSCULAR | Status: DC | PRN
  Administered 2013-09-11 (×2): 10 mg via INTRAVENOUS
  Filled 2013-09-10 (×2): qty 1

## 2013-09-10 MED ORDER — OMEGA-3 FATTY ACIDS 1000 MG PO CAPS: 1.0000 g | ORAL_CAPSULE | Freq: Two times a day (BID) | ORAL | Status: DC

## 2013-09-10 MED ORDER — TRAMADOL HCL 50 MG PO TABS: 50.0000 mg | ORAL_TABLET | Freq: Four times a day (QID) | ORAL | Status: DC | PRN

## 2013-09-10 MED ORDER — FUROSEMIDE 40 MG PO TABS
40.0000 mg | ORAL_TABLET | Freq: Every day | ORAL | Status: DC
  Administered 2013-09-10 – 2013-09-11 (×2): 40 mg via ORAL
  Filled 2013-09-10 (×3): qty 1

## 2013-09-10 MED ORDER — LOSARTAN POTASSIUM 50 MG PO TABS
100.0000 mg | ORAL_TABLET | Freq: Every day | ORAL | Status: DC
  Administered 2013-09-11 – 2013-09-13 (×2): 100 mg via ORAL
  Filled 2013-09-10 (×4): qty 2

## 2013-09-10 MED ORDER — SODIUM CHLORIDE 0.9 % IV SOLN: 250.0000 mL | INTRAVENOUS | Status: DC | PRN

## 2013-09-10 MED ORDER — ACETAMINOPHEN 325 MG PO TABS
650.0000 mg | ORAL_TABLET | ORAL | Status: DC | PRN
  Administered 2013-09-11: 650 mg via ORAL
  Filled 2013-09-10: qty 2

## 2013-09-10 MED ORDER — SODIUM CHLORIDE 0.9 % IJ SOLN
3.0000 mL | Freq: Two times a day (BID) | INTRAMUSCULAR | Status: DC
  Administered 2013-09-10 – 2013-09-12 (×3): 3 mL via INTRAVENOUS

## 2013-09-10 MED ORDER — ONDANSETRON HCL 4 MG/2ML IJ SOLN
4.0000 mg | Freq: Four times a day (QID) | INTRAMUSCULAR | Status: DC | PRN
  Administered 2013-09-11: 4 mg via INTRAVENOUS
  Filled 2013-09-10: qty 2

## 2013-09-10 MED ORDER — NITROGLYCERIN 0.4 MG SL SUBL
0.4000 mg | SUBLINGUAL_TABLET | SUBLINGUAL | Status: DC | PRN
  Administered 2013-09-11 – 2013-09-13 (×3): 0.4 mg via SUBLINGUAL
  Filled 2013-09-10 (×4): qty 25

## 2013-09-10 MED ORDER — ASPIRIN EC 81 MG PO TBEC
81.0000 mg | DELAYED_RELEASE_TABLET | Freq: Every day | ORAL | Status: DC
  Administered 2013-09-11 – 2013-09-13 (×2): 81 mg via ORAL
  Filled 2013-09-10 (×4): qty 1

## 2013-09-10 NOTE — Progress Notes (Signed)
Notified [name] of patients arrival

## 2013-09-10 NOTE — Progress Notes (Signed)
 Clinical Summary [name] is a 79 y.o.male  1. Mild systolic dysfunction - echo 07/2013 w/ LVEF 40-45% - reports low  functional capacity due to back pain, can walk only a few feet before having to stop b/c of pain. No orthopnea, no PND, + LE edema. - denies any chest pain - compliant w/ meds: enalapril, lasix, losartan, lopressor. He also of note is on clonidine  2. HTN - checks bp regularly. Typically in AM 200s/100s. Repeats bp before lunch 140-160/80s. Checks in evening 160s/80s.  - compliant w/ meds, takes on average clonidine 3 times a day.  3. Bradycardia:  - describes some lightheadedness/dizziness. This has worsened over last month reports increased dizziness. Episode while walking up ramp 3 weeks ago, blacked out and fell down. "blacked out". - vitals machine reports HR in 50s at home when he checks his blood pressure.   Past Medical History  Diagnosis Date  . Allergic rhinitis   . OSA (obstructive sleep apnea)   . Obesity   . Essential hypertension, benign   . Diaphragmatic hernia without mention of obstruction or gangrene   . Anxiety   . Anemia   . Elevated liver function tests   . Diabetes mellitus   . Diastolic congestive heart failure      No Known Allergies   Current Outpatient Prescriptions  Medication Sig Dispense Refill  . alendronate (FOSAMAX) 70 MG tablet Take 1 tablet by mouth Once a week.      . aspirin 81 MG tablet Take 81 mg by mouth daily.        . cloNIDine (CATAPRES) 0.1 MG tablet Take 1 tablet by mouth Three times daily as needed.      . enalapril (VASOTEC) 20 MG tablet Take 20 mg by mouth 2 (two) times daily.        . etodolac (LODINE) 400 MG tablet Take 1 tablet by mouth Twice daily.      . fish oil-omega-3 fatty acids 1000 MG capsule Take 1 g by mouth 2 (two) times daily.        . furosemide (LASIX) 40 MG tablet Take 40 mg by mouth daily.        . losartan (COZAAR) 100 MG tablet Take 1 tablet by mouth daily.      . metoprolol  (LOPRESSOR) 50 MG tablet Take 50 mg by mouth daily.        . Multiple Vitamins-Minerals (MULTIVITAMIN PO) Take 1 tablet by mouth daily.        . potassium chloride (KLOR-CON) 10 MEQ CR tablet Take 10 mEq by mouth daily.        . Tamsulosin HCl (FLOMAX) 0.4 MG CAPS Take 0.4 mg by mouth daily after supper.        . traMADol (ULTRAM) 50 MG tablet Take 50 mg by mouth every 6 (six) hours as needed.       No current facility-administered medications for this visit.     Past Surgical History  Procedure Laterality Date  . Cardiac catheterization  1998  . Cataract surgery  2010     No Known Allergies    No family history on file.   Social History [name] reports that he has never smoked. He does not have any smokeless tobacco history on file. [name] reports that he does not drink alcohol.   Review of Systems 12 point ROS negative other than reported in HPI  Physical Examination p 40 bp 141/77 Gen: resting comfortably, NAD HEENT:   no scleral icterus, pupils equal round and reactive, no palptable cervical adenopathy CV: bradycardic, regular Pulm: CTAB Abd: soft, NT, ND NABS, no hepatosplenomegaly Ext: warm, no edema.  Skin: warm, no rash Neuro: A&Ox3, no focal deficits    Diagnostic Studies 01/2010 Myoview:  This was performed on February 17, 2010. There was a fixed inferoseptal defect with normal wall motion but no ischemia. Ejection fraction was 73%.  01/2010 Echo: mild to mod LVH, normal LVEF 55-60%, mod LAE,   07/2013 Echo: mild LVH, LVEF 40-45%, LAE, mild MR,   09/10/13 EKGs:  1st EKG shows no sinus activity w/ junctional escape rhythm at 40, LAD probable LAFB.  2nd EKG: sinus bradycardia rate 50, 1st degree block, 3.6 second sinus pause w/ no escape  Assessment and Plan  1. Bradycardia: patient w/ evidence of sinus arrest w/ escape junctional rhythm as well as a 3.6 second sinus pause with no escape. He denies any symptoms currently, but has increased  dizziness/lightheadedness over the last month w/ an episode of "blacking out" 3 weeks ago. How much of his dysfunction is due to the metoprolol and clonidine is unclear. - will admit to [facility] for observation, hold metoprolol and clonidine and follow EKG response. Will be evaluated by EP for possible pacemaker. Patient will be transferred by EMS w/ cardiac monitoring.    Farida Mcreynolds F. Janayah Zavada, M.D., F.A.C.C.  

## 2013-09-10 NOTE — Patient Instructions (Signed)
You are being transported to [facility] to be admitted by EMS.

## 2013-09-10 NOTE — Progress Notes (Signed)
Pt with 2.54 second pause, asymptomatic. Admitted with same issues. Will cont to monitor

## 2013-09-11 ENCOUNTER — Encounter (HOSPITAL_COMMUNITY): Payer: Self-pay | Admitting: Neurology

## 2013-09-11 ENCOUNTER — Other Ambulatory Visit: Payer: Self-pay

## 2013-09-11 ENCOUNTER — Inpatient Hospital Stay (HOSPITAL_COMMUNITY): Payer: Medicare Other

## 2013-09-11 ENCOUNTER — Other Ambulatory Visit (INDEPENDENT_AMBULATORY_CARE_PROVIDER_SITE_OTHER): Payer: Medicare Other | Admitting: Cardiology

## 2013-09-11 ENCOUNTER — Encounter (HOSPITAL_COMMUNITY)
Admission: AD | Disposition: A | Discharge status: Home / Self Care | Payer: Self-pay | Source: Ambulatory Visit | Attending: Cardiology

## 2013-09-11 DIAGNOSIS — R079 Chest pain, unspecified: Secondary | ICD-10-CM

## 2013-09-11 DIAGNOSIS — G9340 Encephalopathy, unspecified: Secondary | ICD-10-CM

## 2013-09-11 DIAGNOSIS — I2510 Atherosclerotic heart disease of native coronary artery without angina pectoris: Secondary | ICD-10-CM

## 2013-09-11 DIAGNOSIS — E119 Type 2 diabetes mellitus without complications: Secondary | ICD-10-CM

## 2013-09-11 DIAGNOSIS — R4182 Altered mental status, unspecified: Secondary | ICD-10-CM | POA: Diagnosis present

## 2013-09-11 DIAGNOSIS — I495 Sick sinus syndrome: Secondary | ICD-10-CM

## 2013-09-11 DIAGNOSIS — I10 Essential (primary) hypertension: Secondary | ICD-10-CM

## 2013-09-11 DIAGNOSIS — R55 Syncope and collapse: Secondary | ICD-10-CM

## 2013-09-11 HISTORY — PX: LEFT AND RIGHT HEART CATHETERIZATION WITH CORONARY ANGIOGRAM: SHX5449

## 2013-09-11 LAB — COMPREHENSIVE METABOLIC PANEL
ALT: 13 U/L (ref 0–53)
AST: 19 U/L (ref 0–37)
Calcium: 9 mg/dL (ref 8.4–10.5)
Potassium: 3.2 mEq/L — ABNORMAL LOW (ref 3.5–5.1)
Sodium: 139 mEq/L (ref 135–145)
Total Protein: 8.2 g/dL (ref 6.0–8.3)

## 2013-09-11 LAB — CBC WITH DIFFERENTIAL/PLATELET
Basophils Absolute: 0 K/uL (ref 0.0–0.1)
Basophils Relative: 0 % (ref 0–1)
Eosinophils Absolute: 0 K/uL (ref 0.0–0.7)
Eosinophils Relative: 0 % (ref 0–5)
HCT: 46.9 % (ref 39.0–52.0)
Hemoglobin: 17.1 g/dL — ABNORMAL HIGH (ref 13.0–17.0)
Lymphocytes Relative: 4 % — ABNORMAL LOW (ref 12–46)
Lymphs Abs: 0.4 K/uL — ABNORMAL LOW (ref 0.7–4.0)
MCH: 32 pg (ref 26.0–34.0)
MCHC: 36.5 g/dL — ABNORMAL HIGH (ref 30.0–36.0)
MCV: 87.7 fL (ref 78.0–100.0)
Monocytes Absolute: 0.7 K/uL (ref 0.1–1.0)
Monocytes Relative: 6 % (ref 3–12)
Neutro Abs: 9.6 K/uL — ABNORMAL HIGH (ref 1.7–7.7)
Neutrophils Relative %: 90 % — ABNORMAL HIGH (ref 43–77)
Platelets: 192 K/uL (ref 150–400)
RBC: 5.35 MIL/uL (ref 4.22–5.81)
RDW: 13.5 % (ref 11.5–15.5)
WBC: 10.7 K/uL — ABNORMAL HIGH (ref 4.0–10.5)

## 2013-09-11 LAB — GLUCOSE, CAPILLARY: Glucose-Capillary: 202 mg/dL — ABNORMAL HIGH (ref 70–99)

## 2013-09-11 LAB — POCT I-STAT 3, VENOUS BLOOD GAS (G3P V)
pCO2, Ven: 28.6 mmHg — ABNORMAL LOW (ref 45.0–50.0)
pH, Ven: 7.496 — ABNORMAL HIGH (ref 7.250–7.300)

## 2013-09-11 LAB — MRSA PCR SCREENING: MRSA by PCR: NEGATIVE

## 2013-09-11 LAB — POCT I-STAT 3, ART BLOOD GAS (G3+)
Acid-Base Excess: 2 mmol/L (ref 0.0–2.0)
Acid-Base Excess: 1 mmol/L (ref 0.0–2.0)
Bicarbonate: 20.6 mEq/L (ref 20.0–24.0)
O2 Saturation: 94 %
Patient temperature: 98.3
pCO2 arterial: 28.7 mmHg — ABNORMAL LOW (ref 35.0–45.0)
pH, Arterial: 7.569 — ABNORMAL HIGH (ref 7.350–7.450)

## 2013-09-11 LAB — URINALYSIS, ROUTINE W REFLEX MICROSCOPIC
Bilirubin Urine: NEGATIVE
Glucose, UA: 100 mg/dL — AB
Ketones, ur: 15 mg/dL — AB
Nitrite: NEGATIVE
Protein, ur: 30 mg/dL — AB
pH: 8 (ref 5.0–8.0)

## 2013-09-11 LAB — URINE MICROSCOPIC-ADD ON

## 2013-09-11 SURGERY — LEFT AND RIGHT HEART CATHETERIZATION WITH CORONARY ANGIOGRAM
Anesthesia: LOCAL

## 2013-09-11 MED ORDER — SODIUM CHLORIDE 0.9 % IJ SOLN: 3.0000 mL | Freq: Two times a day (BID) | INTRAMUSCULAR | Status: DC

## 2013-09-11 MED ORDER — FENTANYL CITRATE 0.05 MG/ML IJ SOLN
INTRAMUSCULAR | Status: AC
  Filled 2013-09-11: qty 2

## 2013-09-11 MED ORDER — SODIUM CHLORIDE 0.9 % IV SOLN: 250.0000 mL | INTRAVENOUS | Status: DC | PRN

## 2013-09-11 MED ORDER — ASPIRIN 81 MG PO CHEW
324.0000 mg | CHEWABLE_TABLET | ORAL | Status: AC
  Administered 2013-09-11: 243 mg via ORAL

## 2013-09-11 MED ORDER — NICARDIPINE HCL IN NACL 20-0.86 MG/200ML-% IV SOLN
5.0000 mg/h | INTRAVENOUS | Status: DC
  Administered 2013-09-11: 5 mg/h via INTRAVENOUS
  Administered 2013-09-11: 7.5 mg/h via INTRAVENOUS
  Filled 2013-09-11 (×2): qty 200

## 2013-09-11 MED ORDER — ASPIRIN 81 MG PO CHEW
CHEWABLE_TABLET | ORAL | Status: AC
  Administered 2013-09-11: 243 mg via ORAL
  Filled 2013-09-11: qty 3

## 2013-09-11 MED ORDER — METOPROLOL TARTRATE 1 MG/ML IV SOLN: 2.5000 mg | INTRAVENOUS | Status: DC | PRN

## 2013-09-11 MED ORDER — HALOPERIDOL LACTATE 5 MG/ML IJ SOLN
1.0000 mg | INTRAMUSCULAR | Status: DC | PRN
  Administered 2013-09-12 (×3): 2 mg via INTRAVENOUS
  Administered 2013-09-14: 04:00:00 1 mg via INTRAVENOUS
  Filled 2013-09-11 (×2): qty 1
  Filled 2013-09-11: qty 0.8
  Filled 2013-09-11: qty 1

## 2013-09-11 MED ORDER — SODIUM CHLORIDE 0.9 % IJ SOLN: 3.0000 mL | INTRAMUSCULAR | Status: DC | PRN

## 2013-09-11 MED ORDER — HEPARIN (PORCINE) IN NACL 2-0.9 UNIT/ML-% IJ SOLN
INTRAMUSCULAR | Status: AC
  Filled 2013-09-11: qty 1000

## 2013-09-11 MED ORDER — SODIUM CHLORIDE 0.9 % IV SOLN
INTRAVENOUS | Status: DC
  Administered 2013-09-11 – 2013-09-13 (×3): via INTRAVENOUS

## 2013-09-11 MED ORDER — NICARDIPINE HCL IN NACL 20-0.86 MG/200ML-% IV SOLN
5.0000 mg/h | INTRAVENOUS | Status: DC
  Administered 2013-09-11: 12.5 mg/h via INTRAVENOUS
  Administered 2013-09-12: 8 mg/h via INTRAVENOUS
  Administered 2013-09-12: 10 mg/h via INTRAVENOUS
  Filled 2013-09-11 (×4): qty 200

## 2013-09-11 MED ORDER — SODIUM CHLORIDE 0.9 % IV SOLN: INTRAVENOUS | Status: AC

## 2013-09-11 MED ORDER — POTASSIUM CHLORIDE 10 MEQ/100ML IV SOLN
10.0000 meq | INTRAVENOUS | Status: AC
  Administered 2013-09-11 – 2013-09-12 (×4): 10 meq via INTRAVENOUS
  Filled 2013-09-11 (×4): qty 100

## 2013-09-11 MED ORDER — ACETAMINOPHEN 650 MG RE SUPP
650.0000 mg | Freq: Four times a day (QID) | RECTAL | Status: DC | PRN
  Administered 2013-09-11: 650 mg via RECTAL
  Filled 2013-09-11: qty 1

## 2013-09-11 MED ORDER — SODIUM CHLORIDE 0.9 % IV SOLN: INTRAVENOUS | Status: DC

## 2013-09-11 MED ORDER — CLONIDINE HCL 0.2 MG/24HR TD PTWK
0.2000 mg | MEDICATED_PATCH | TRANSDERMAL | Status: DC
  Administered 2013-09-11: 0.2 mg via TRANSDERMAL
  Filled 2013-09-11: qty 1

## 2013-09-11 MED ORDER — LORAZEPAM 2 MG/ML IJ SOLN
0.2500 mg | INTRAMUSCULAR | Status: DC | PRN
  Administered 2013-09-11 – 2013-09-12 (×3): 0.25 mg via INTRAVENOUS
  Filled 2013-09-11 (×3): qty 1

## 2013-09-11 MED ORDER — MIDAZOLAM HCL 2 MG/2ML IJ SOLN
INTRAMUSCULAR | Status: AC
  Filled 2013-09-11: qty 2

## 2013-09-11 MED ORDER — LORAZEPAM 2 MG/ML IJ SOLN
INTRAMUSCULAR | Status: AC
  Filled 2013-09-11: qty 1

## 2013-09-11 MED ORDER — METOPROLOL TARTRATE 1 MG/ML IV SOLN
INTRAVENOUS | Status: AC
  Filled 2013-09-11: qty 5

## 2013-09-11 MED ORDER — SODIUM CHLORIDE 0.9 % IV SOLN
INTRAVENOUS | Status: DC
  Administered 2013-09-11: 11:00:00 via INTRAVENOUS

## 2013-09-11 MED ORDER — LIDOCAINE HCL (PF) 1 % IJ SOLN
INTRAMUSCULAR | Status: AC
  Filled 2013-09-11: qty 30

## 2013-09-11 MED ORDER — CARVEDILOL 3.125 MG PO TABS
3.1250 mg | ORAL_TABLET | Freq: Two times a day (BID) | ORAL | Status: DC
  Administered 2013-09-11: 3.125 mg via ORAL
  Filled 2013-09-11 (×4): qty 1

## 2013-09-11 MED ORDER — INSULIN ASPART 100 UNIT/ML ~~LOC~~ SOLN
0.0000 [IU] | SUBCUTANEOUS | Status: DC
  Administered 2013-09-11: 3 [IU] via SUBCUTANEOUS
  Administered 2013-09-12 – 2013-09-13 (×3): 2 [IU] via SUBCUTANEOUS
  Administered 2013-09-13: 3 [IU] via SUBCUTANEOUS

## 2013-09-11 MED ORDER — HYDRALAZINE HCL 20 MG/ML IJ SOLN
INTRAMUSCULAR | Status: AC
  Filled 2013-09-11: qty 1

## 2013-09-11 MED ORDER — ASPIRIN 81 MG PO CHEW: 243.0000 mg | CHEWABLE_TABLET | Freq: Once | ORAL | Status: DC

## 2013-09-11 NOTE — Progress Notes (Signed)
Pt arrived from cath lab shaking, vomiting, and SOB. 4mg  IV zofran administered. Pt placed on 2L o2 via nasal cannula. Pt unable to follow commands. BP elevated to 206/91.Heart rate sinus in 90s. EKG obtained. Rectal temp 98.5. CBG 175. Right groin level zero. Rapid response called. Dr Kirke Corin made aware and at bedside. Will continue to monitor patient closely. Levonne Spiller, RN

## 2013-09-11 NOTE — Consult Note (Signed)
ELECTROPHYSIOLOGY CONSULT NOTE  Patient ID: [name] MRN: [id number], DOB/AGE: [birth year]   Admit date: 09/10/2013 Date of Consult: 09/11/2013  Primary Physician: Ignatius Specking., MD Primary Cardiologist: Dina Rich, MD Reason for Consultation: Syncope with sinus node dysfunction  History of Present Illness [name] is a 79 y.o. male with DM, OSA, osteoporosis and BPH who presented to Dr. Wyline Mood for cardiac evaluation after an episode of syncope. He has no history of CAD/MI, valvular heart disease or HF. He reports DOE, intermittent dizziness and presyncope x 1 month. About 3 weeks ago he experienced frank syncope where he lost consciousness while walking up the ramp leading to his back door. He had no warning or prodrome. He states, "I just blacked out." He was unconscious briefly, only a few seconds. By the time his wife got to him he was already attempting to stand up. Recent echo from August 2014 shows LVEF 40-45%. ECGs from the office yesterday show junctional bradycardia at 40 bpm and sinus pauses ~3.5 seconds. He is currently taking metoprolol 50 mg twice daily and clonidine PRN which are now being held. His last dose of metoprolol was yesterday morning around 8 AM. TSH here is normal. He denies palpitations. He denies LE swelling, orthopnea or PND.  As mentioned above, he has had increasing DOE x 1 month. He experienced chest pain this AM during my interview and exam which was described as central chest pressure, accompanied by SOB and nausea. SBP 200. ECG was done revealing SR with first degree AV block and mild inferolateral flattened / downsloping ST depression. He was given SL NTG x 1 with relief. He is now pain free.   Past Medical History Past Medical History  Diagnosis Date  . Allergic rhinitis   . Obesity   . Essential hypertension, benign   . Diaphragmatic hernia without mention of obstruction or gangrene   . Anxiety   . Anemia   . Elevated liver function  tests   . Diastolic congestive heart failure   . OSA on CPAP     "wears mask part of the time" (09/10/2013)  . Diabetes mellitus     "diet controlled" (09/10/2013)  . Arthritis     "all over my body" (09/10/2013)  . Osteoporosis   . Chronic lower back pain   . Depression   . BPH (benign prostatic hypertrophy)     Past Surgical History Past Surgical History  Procedure Laterality Date  . Cardiac catheterization  1998  . Cataract extraction w/ intraocular lens  implant, bilateral  2010  . Stomach surgery  03/02/2012    ?volvus; "stomach came loose and twisted; went up under rib cage; had that corrected; tacked it up" (09/10/2013)    Allergies/Intolerances No Known Allergies  Current Home Medications Medications Prior to Admission  Medication Sig Dispense Refill  . acetaminophen (TYLENOL) 500 MG tablet Take 500 mg by mouth every 6 (six) hours as needed for pain.      Marland Kitchen alendronate (FOSAMAX) 70 MG tablet Take 70 mg by mouth every 7 (seven) days. Take with a full glass of water on an empty stomach. On Mondays      . aspirin 81 MG tablet Take 81 mg by mouth daily.        . Calcium Carbonate-Vitamin D (CALCIUM 600 + D PO) Take 600 mg by mouth 2 (two) times daily.      . cloNIDine (CATAPRES) 0.1 MG tablet Take 0.1 mg by mouth 2 (two) times daily.  If BP is over 170 (top number), take extra If BP is less than 140 (top number), DO NOT TAKE      . enalapril (VASOTEC) 20 MG tablet Take 20 mg by mouth 2 (two) times daily.        . fish oil-omega-3 fatty acids 1000 MG capsule Take 1 g by mouth 2 (two) times daily.        . furosemide (LASIX) 40 MG tablet Take 40 mg by mouth daily.       Marland Kitchen losartan (COZAAR) 100 MG tablet Take 1 tablet by mouth daily.      . metoprolol (LOPRESSOR) 50 MG tablet Take 50 mg by mouth 2 (two) times daily.       . Multiple Vitamin (MULTIVITAMIN WITH MINERALS) TABS tablet Take 1 tablet by mouth daily.      . NONFORMULARY OR COMPOUNDED ITEM Apply 1 application topically 2  (two) times daily. Diclo 3% / baclo 2% / cyclo 2% / tetracaine 2% compounded cream      . potassium chloride (KLOR-CON) 10 MEQ CR tablet Take 10 mEq by mouth daily.        . tamsulosin (FLOMAX) 0.4 MG CAPS capsule Take 0.4 mg by mouth 2 (two) times daily.       . traMADol (ULTRAM) 50 MG tablet Take 50 mg by mouth at bedtime.        Inpatient Medications . aspirin EC  81 mg Oral Daily  . enalapril  20 mg Oral BID  . enoxaparin (LOVENOX) injection  40 mg Subcutaneous Q24H  . furosemide  40 mg Oral Daily  . losartan  100 mg Oral Daily  . multivitamin with minerals  1 tablet Oral Daily  . omega-3 acid ethyl esters  1 g Oral BID  . potassium chloride  10 mEq Oral Daily  . sodium chloride  3 mL Intravenous Q12H  . tamsulosin  0.4 mg Oral QPC supper    Family History Positive for CAD   Social History History   Social History  . Marital Status: Married    Spouse Name: N/A    Number of Children: 2  . Years of Education: N/A   Occupational History  . MERCHANDISING FOR [employer]     WORKED IN [employer]   Social History Main Topics  . Smoking status: Never Smoker   . Smokeless tobacco: Never Used  . Alcohol Use: No  . Drug Use: No  . Sexual Activity: No   Other Topics Concern  . Not on file   Social History Narrative  . No narrative on file     Review of Systems General: No chills, fever, night sweats or weight changes  Cardiovascular:  No edema, orthopnea, palpitations, paroxysmal nocturnal dyspnea Dermatological: No rash, lesions or masses Respiratory: No cough, dyspnea Urologic: No hematuria, dysuria Abdominal: No nausea, vomiting, diarrhea, bright red blood per rectum, melena, or hematemesis Neurologic: No visual changes, weakness, changes in mental status All other systems reviewed and are otherwise negative except as noted above.  Physical Exam Vitals: Blood pressure 208/110, pulse 55, temperature 97.8 F (36.6 C), temperature source Oral, resp. rate 18,  height 6\' 1"  (1.854 m), weight 187 lb 8 oz (85.049 kg), SpO2 96.00%.  General: Well developed, well appearing 79 y.o. male in no acute distress. HEENT: Normocephalic, atraumatic. EOMs intact. Sclera nonicteric. Oropharynx clear.  Neck: Supple without bruits. No JVD. Lungs: Respirations regular and unlabored, CTA bilaterally. No wheezes, rales or rhonchi. Heart: RRR. S1, S2  present. No murmurs, rub, S3 or S4. Abdomen: Soft, non-tender, non-distended. BS present x 4 quadrants. No hepatosplenomegaly.  Extremities: No clubbing, cyanosis or edema. DP/PT/Radials 2+ and equal bilaterally. Psych: Normal affect. Neuro: Alert and oriented X 3. Moves all extremities spontaneously. Musculoskeletal: No kyphosis. Skin: Intact. Warm and dry. No rashes or petechiae in exposed areas.   Labs Lab Results  Component Value Date   WBC 6.4 09/10/2013   HGB 15.2 09/10/2013   HCT 41.7 09/10/2013   MCV 88.9 09/10/2013   PLT 182 09/10/2013    Recent Labs Lab 09/10/13 1450  NA 139  K 3.7  CL 102  CO2 26  BUN 21  CREATININE 0.97  CALCIUM 9.8  PROT 7.5  BILITOT 0.7  ALKPHOS 58  ALT 11  AST 17  GLUCOSE 103*    Recent Labs  09/10/13 1450  TSH 0.964    Recent Labs  09/10/13 1450  INR 1.07    Radiology/Studies X-ray Chest PA and Lateral 09/11/2013    IMPRESSION: 1. No evidence of acute cardiopulmonary disease. 2. Mild right base atelectasis. 3. Suspect COPD.    Electronically Signed   By: Tiburcio Pea   On: 09/11/2013 06:00   Echocardiogram 07/29/2013 (from [facility] - please note incorrect date listed in EPIC - study is dated 07/29/2013, not Aug 2013) Study conclusions: Mild LVH, LVEF 40-45%, normal diastolic function, normal RV size and function, LAE (LA dimension 6.28 cm), mild MR, mild TR  12-lead ECGs from [place] office reviewed -    At 10:15 AM - junctional bradycardia at 40 bpm   At 11:08 AM - SR with long first degree AV block and sinus pauses with constant PR  interval 12-lead ECG this AM with chest pain - SR with first degree AV block;  mild inferolateral flattened / downsloping ST depression  Telemetry shows SR with first degree AV block; intermittent sinus bradycardia and Mobitz I AV block / Wenckebach  Assessment and Plan 1. Chest pain and DOE 2. New LV dysfunction, EF 40-45% 3. Conduction system disease with sinus node dysfunction and long first degree AV block 4. OSA 5. DM  [name] presents with syncope and documented sinus pauses. Metoprolol is being held. In addition, he reports DOE x 1 month and had CP this AM concerning for [place]. In setting of new LV dysfunction, we have recommended definitive evaluation of his coronary anatomy with a cardiac catheterization today. The procedure was explained to [name] and his wife in detail, including risks and benefits. These risks include but are not limited to, death, stroke, MI, bleeding, blood clots, damage to the blood vessels and/or damage to the kidneys. [name] and his wife expressed verbal understanding and agree to proceed. We will continue to hold metoprolol and watch his rate/rhythm. Further recommendations will be made pending the results of his cardiac cath and his clinical course.   Dr. Ladona Ridgel to see Signed, Rick Duff, PA-C 09/11/2013, 8:46 AM  Cardiology Attending  Patient seen and examined. He has been admitted with syncope, LV dysfunction and recurrent chest pressure, occurring at rest. I agree with the history, exam, assessment and plan by Mrs. Edmisten, PA-C. As the patient also has sob, will plan left and right heart cath.   Leonia Reeves.D.

## 2013-09-11 NOTE — Progress Notes (Addendum)
Patient ID: [name], male   DOB: 1933/07/08, 79 y.o.   MRN: [id number]  On call cardiologist called to evaluate patient. Patient hypertensive bp 188/110 manually and agitated initially, now sedated after receiving ativan. He is in SR rate 80. Patient w/ progressive HTN throughout the day, as well as waxing and waning mental status with intermittent agitation, bradycadia/tachycardia. Presentation consistent w/ clonidine withdrawal, possible contribution of beta blocker withdrawal as well. Patient admitted w/ severe bradycardia and sinus pauses, medications held starting yesterday. Tachycardia, agitation, HTN developed this afternoon and has progressed. Earlier eval by neuro in setting of AMS and stoke activation showed no focal neuro changes and negative head CT. Difficult to manage patient given acute withdrawal in setting of underlying cardiac conduction disease. Will place low dose clonidine patch for steady level of release, ICU team has recommended nicardapine drip to manage bp and will be consulted on the patient. If refractory bp to nicardapine, will consider phentolamine. Updated patient's family on his status.

## 2013-09-11 NOTE — H&P (View-Only) (Signed)
Clinical Summary Mr. Pursley is a 79 y.o.male  1. Mild systolic dysfunction - echo 07/2013 w/ LVEF 40-45% - reports low  functional capacity due to back pain, can walk only a few feet before having to stop b/c of pain. No orthopnea, no PND, + LE edema. - denies any chest pain - compliant w/ meds: enalapril, lasix, losartan, lopressor. He also of note is on clonidine  2. HTN - checks bp regularly. Typically in AM 200s/100s. Repeats bp before lunch 140-160/80s. Checks in evening 160s/80s.  - compliant w/ meds, takes on average clonidine 3 times a day.  3. Bradycardia:  - describes some lightheadedness/dizziness. This has worsened over last month reports increased dizziness. Episode while walking up ramp 3 weeks ago, blacked out and fell down. "blacked out". - vitals machine reports HR in 50s at home when he checks his blood pressure.   Past Medical History  Diagnosis Date  . Allergic rhinitis   . OSA (obstructive sleep apnea)   . Obesity   . Essential hypertension, benign   . Diaphragmatic hernia without mention of obstruction or gangrene   . Anxiety   . Anemia   . Elevated liver function tests   . Diabetes mellitus   . Diastolic congestive heart failure      No Known Allergies   Current Outpatient Prescriptions  Medication Sig Dispense Refill  . alendronate (FOSAMAX) 70 MG tablet Take 1 tablet by mouth Once a week.      Marland Kitchen aspirin 81 MG tablet Take 81 mg by mouth daily.        . cloNIDine (CATAPRES) 0.1 MG tablet Take 1 tablet by mouth Three times daily as needed.      . enalapril (VASOTEC) 20 MG tablet Take 20 mg by mouth 2 (two) times daily.        Marland Kitchen etodolac (LODINE) 400 MG tablet Take 1 tablet by mouth Twice daily.      . fish oil-omega-3 fatty acids 1000 MG capsule Take 1 g by mouth 2 (two) times daily.        . furosemide (LASIX) 40 MG tablet Take 40 mg by mouth daily.        Marland Kitchen losartan (COZAAR) 100 MG tablet Take 1 tablet by mouth daily.      . metoprolol  (LOPRESSOR) 50 MG tablet Take 50 mg by mouth daily.        . Multiple Vitamins-Minerals (MULTIVITAMIN PO) Take 1 tablet by mouth daily.        . potassium chloride (KLOR-CON) 10 MEQ CR tablet Take 10 mEq by mouth daily.        . Tamsulosin HCl (FLOMAX) 0.4 MG CAPS Take 0.4 mg by mouth daily after supper.        . traMADol (ULTRAM) 50 MG tablet Take 50 mg by mouth every 6 (six) hours as needed.       No current facility-administered medications for this visit.     Past Surgical History  Procedure Laterality Date  . Cardiac catheterization  1998  . Cataract surgery  2010     No Known Allergies    No family history on file.   Social History Mr. Sellin reports that he has never smoked. He does not have any smokeless tobacco history on file. Mr. Kading reports that he does not drink alcohol.   Review of Systems 12 point ROS negative other than reported in HPI  Physical Examination p 40 bp 141/77 Gen: resting comfortably, NAD HEENT:  no scleral icterus, pupils equal round and reactive, no palptable cervical adenopathy CV: bradycardic, regular Pulm: CTAB Abd: soft, NT, ND NABS, no hepatosplenomegaly Ext: warm, no edema.  Skin: warm, no rash Neuro: A&Ox3, no focal deficits    Diagnostic Studies 01/2010 Myoview:  This was performed on February 17, 2010. There was a fixed inferoseptal defect with normal wall motion but no ischemia. Ejection fraction was 73%.  01/2010 Echo: mild to mod LVH, normal LVEF 55-60%, mod LAE,   07/2013 Echo: mild LVH, LVEF 40-45%, LAE, mild MR,   09/10/13 EKGs:  1st EKG shows no sinus activity w/ junctional escape rhythm at 40, LAD probable LAFB.  2nd EKG: sinus bradycardia rate 50, 1st degree block, 3.6 second sinus pause w/ no escape  Assessment and Plan  1. Bradycardia: patient w/ evidence of sinus arrest w/ escape junctional rhythm as well as a 3.6 second sinus pause with no escape. He denies any symptoms currently, but has increased  dizziness/lightheadedness over the last month w/ an episode of "blacking out" 3 weeks ago. How much of his dysfunction is due to the metoprolol and clonidine is unclear. - will admit to [facility] for observation, hold metoprolol and clonidine and follow EKG response. Will be evaluated by EP for possible pacemaker. Patient will be transferred by EMS w/ cardiac monitoring.    Antoine Poche, M.D., F.A.C.C.

## 2013-09-11 NOTE — Significant Event (Signed)
Rapid Response Event Note  Overview: Time Called: 1535 Arrival Time: 1538 Event Type: Neurologic;Respiratory  Initial Focused Assessment: Patient with altered mental status.  Repeating some phrases but not communicating with staff, unable to follow commands.  Initially patient inattentive to Right side but is able to cross midline with gaze. Moves all extremeties equally. Patient shivering,  BP 150-200/90-110  SR 89 occ burst of SVT then brady,  RR 36-40  O2 sat 98% on 2 L [place] Rectal temp 98.6 Per staff as patient arrived from cath lab, he was vomiting, received Zofran.   Dr Kirke Corin notified and at bedside to assess patient.  He stated that patient was responding normally during Cath procedure, LKW at 1515.  Patient received Versed and fentanyl during the case.  Patient began shivering prior to leaving cath lab.   Wife at bedside  Interventions: Code Stroke called at 1604 questionable aphasia verses confusion, altered mental status, sudden neuro change. Stat head CT done Bedside RN called report to CCU Patient transported to 2h01 via bed with heart monitor and O2 Staff at bedside to recieve patient. Per Dr Roseanne Reno, code stroke canceled,  Plan EEG  Event Summary: Name of Physician Notified: Dr Kirke Corin at 1540  Name of Consulting Physician Notified: Dr Roseanne Reno at 1604  Outcome: Transferred (Comment) [phone])  Event End Time: 1715  Marcellina Millin

## 2013-09-11 NOTE — Progress Notes (Signed)
eLink Physician-Brief Progress Note Patient Name: [name] DOB: 24-May-1933 MRN: [id number]  Date of Service  09/11/2013   HPI/Events of Note  On routine rounds I found this pt acutely agitated and hypertensive.    eICU Interventions  Check ABG, start cardene drip.   Intervention Category Major Interventions: Change in mental status - evaluation and management;Hypertension - evaluation and management  Shan Levans 09/11/2013, 7:50 PM

## 2013-09-11 NOTE — Progress Notes (Signed)
Report called to receiving RN and patient transported to 2901. Levonne Spiller, RN

## 2013-09-11 NOTE — Consult Note (Signed)
Referring Physician: Branch    Chief Complaint: Code stroke  HPI:                                                                                                                                         [name] is an 79 y.o. male who presented to the hospital for cardiac evaluation due to DOE.  Today patient under went a cardiac catheterization which showed "No evidence of obstructive coronary artery disease in the main arteries. There is significant disease in a normal size first diagonal branch which likely does not explain the patient's symptoms." prior to cardiac catheterrization his BP was elevated between 148/88-210/100.  Post cardiac catheterization patient was noted to have altered mental status, not responding to commands and bilateral shivering, in addition to tachypnea. Code stroke was called and patient was bought to CT. At present time patient is awake, repeating "oh me", follows no commands, moving all extremities equally and antigravity with bilateral shivering noted in both arms and legs. Patient continues transition between tachy-brady rhythms. Initially while in CT scanner he had a left gaze preference but currently he is moving eyes in all directions and looking toward voices.    Date last known well: Date: 09/11/2013 Time last known well: Time: 15:15 tPA Given: No: no focal deficit  Past Medical History  Diagnosis Date  . Allergic rhinitis   . Obesity   . Essential hypertension, benign   . Diaphragmatic hernia without mention of obstruction or gangrene   . Anxiety   . Anemia   . Elevated liver function tests   . Diastolic congestive heart failure   . OSA on CPAP     "wears mask part of the time" (09/10/2013)  . Diabetes mellitus     "diet controlled" (09/10/2013)  . Arthritis     "all over my body" (09/10/2013)  . Osteoporosis   . Chronic lower back pain   . Depression   . BPH (benign prostatic hypertrophy)     Past Surgical History  Procedure Laterality Date   . Cardiac catheterization  1998  . Cataract extraction w/ intraocular lens  implant, bilateral  2010  . Stomach surgery  03/02/2012    ?volvus; "stomach came loose and twisted; went up under rib cage; had that corrected; tacked it up" (09/10/2013)    Family History  Problem Relation Age of Onset  . Hypertension Mother   . Hypertension Father    Social History:  reports that he has never smoked. He has never used smokeless tobacco. He reports that he does not drink alcohol or use illicit drugs.  Allergies: No Known Allergies  Medications:  Prior to Admission:  Prescriptions prior to admission  Medication Sig Dispense Refill  . acetaminophen (TYLENOL) 500 MG tablet Take 500 mg by mouth every 6 (six) hours as needed for pain.      Marland Kitchen alendronate (FOSAMAX) 70 MG tablet Take 70 mg by mouth every 7 (seven) days. Take with a full glass of water on an empty stomach. On Mondays      . aspirin 81 MG tablet Take 81 mg by mouth daily.        . Calcium Carbonate-Vitamin D (CALCIUM 600 + D PO) Take 600 mg by mouth 2 (two) times daily.      . cloNIDine (CATAPRES) 0.1 MG tablet Take 0.1 mg by mouth 2 (two) times daily. If BP is over 170 (top number), take extra If BP is less than 140 (top number), DO NOT TAKE      . enalapril (VASOTEC) 20 MG tablet Take 20 mg by mouth 2 (two) times daily.        . fish oil-omega-3 fatty acids 1000 MG capsule Take 1 g by mouth 2 (two) times daily.        . furosemide (LASIX) 40 MG tablet Take 40 mg by mouth daily.       Marland Kitchen losartan (COZAAR) 100 MG tablet Take 1 tablet by mouth daily.      . metoprolol (LOPRESSOR) 50 MG tablet Take 50 mg by mouth 2 (two) times daily.       . Multiple Vitamin (MULTIVITAMIN WITH MINERALS) TABS tablet Take 1 tablet by mouth daily.      . NONFORMULARY OR COMPOUNDED ITEM Apply 1 application topically 2 (two) times daily.  Diclo 3% / baclo 2% / cyclo 2% / tetracaine 2% compounded cream      . potassium chloride (KLOR-CON) 10 MEQ CR tablet Take 10 mEq by mouth daily.        . tamsulosin (FLOMAX) 0.4 MG CAPS capsule Take 0.4 mg by mouth 2 (two) times daily.       . traMADol (ULTRAM) 50 MG tablet Take 50 mg by mouth at bedtime.       Scheduled: . aspirin  243 mg Oral Once  . aspirin EC  81 mg Oral Daily  . carvedilol  3.125 mg Oral BID WC  . enalapril  20 mg Oral BID  . enoxaparin (LOVENOX) injection  40 mg Subcutaneous Q24H  . furosemide  40 mg Oral Daily  . losartan  100 mg Oral Daily  . multivitamin with minerals  1 tablet Oral Daily  . omega-3 acid ethyl esters  1 g Oral BID  . potassium chloride  10 mEq Oral Daily  . sodium chloride  3 mL Intravenous Q12H  . sodium chloride  3 mL Intravenous Q12H  . tamsulosin  0.4 mg Oral QPC supper    ROS:  History obtained from unobtainable from patient due to mental status   Neurologic Examination:                                                                                                      Blood pressure 179/101, pulse 144, temperature 98.6 F (37 C), temperature source Rectal, resp. rate 36, height 6\' 1"  (1.854 m), weight 85.049 kg (187 lb 8 oz), SpO2 99.00%.  Mental Status: Patient is alert, follows no commands, repeating "oh me", shivering bilaterally Cranial Nerves: II: Discs flat bilaterally; Visual fields blinks to threat bilaterallyl, pupils equal, round, reactive to light and accommodation III,IV, VI: ptosis not present, extra-ocular motions intact bilaterally V,VII: face symmetric, facial light touch sensation normal bilaterally VIII: looks toward voice IX,X: gag reflex present XI: bilateral shoulder shrug XII: midline tongue extension Motor: Moving bilateral extremities antigravity and equally.  Follows no  commands but shows 4/5 strength throughout.  Bilateral arm and leg shivering.  Sensory: withdraws to pain throughout Deep Tendon Reflexes:  Right: Upper Extremity   Left: Upper extremity   biceps (C-5 to C-6) 2/4   biceps (C-5 to C-6) 2/4 tricep (C7) 2/4    triceps (C7) 2/4 Brachioradialis (C6) 2/4  Brachioradialis (C6) 2/4  Lower Extremity Lower Extremity  quadriceps (L-2 to L-4) 2/4   quadriceps (L-2 to L-4) 2/4 Achilles (S1) 2/4   Achilles (S1) 2/4  Plantars: Right: downgoing   Left: downgoing Cerebellar: Unable to assess due to AMS Gait: unable to assess CV: pulses palpable throughout    Results for orders placed during the hospital encounter of 09/10/13 (from the past 48 hour(s))  COMPREHENSIVE METABOLIC PANEL     Status: Abnormal   Collection Time    09/10/13  2:50 PM      Result Value Range   Sodium 139  135 - 145 mEq/L   Potassium 3.7  3.5 - 5.1 mEq/L   Chloride 102  96 - 112 mEq/L   CO2 26  19 - 32 mEq/L   Glucose, Bld 103 (*) 70 - 99 mg/dL   BUN 21  6 - 23 mg/dL   Creatinine, Ser 1.61  0.50 - 1.35 mg/dL   Calcium 9.8  8.4 - 09.6 mg/dL   Total Protein 7.5  6.0 - 8.3 g/dL   Albumin 3.9  3.5 - 5.2 g/dL   AST 17  0 - 37 U/L   ALT 11  0 - 53 U/L   Alkaline Phosphatase 58  39 - 117 U/L   Total Bilirubin 0.7  0.3 - 1.2 mg/dL   GFR calc non Af Amer 77 (*) >90 mL/min   GFR calc Af Amer 89 (*) >90 mL/min   Comment: (NOTE)     The eGFR has been calculated using the CKD EPI equation.     This calculation has not been validated in all clinical situations.     eGFR's persistently <90 mL/min signify possible Chronic Kidney     Disease.  TSH     Status: None   Collection Time    09/10/13  2:50 PM  Result Value Range   TSH 0.964  0.350 - 4.500 uIU/mL   Comment: Performed at [facility]  PROTIME-INR     Status: None   Collection Time    09/10/13  2:50 PM      Result Value Range   Prothrombin Time 13.7  11.6 - 15.2 seconds   INR 1.07  0.00 - 1.49  CBC  WITH DIFFERENTIAL     Status: Abnormal   Collection Time    09/10/13  2:50 PM      Result Value Range   WBC 6.4  4.0 - 10.5 K/uL   RBC 4.69  4.22 - 5.81 MIL/uL   Hemoglobin 15.2  13.0 - 17.0 g/dL   HCT 16.1  09.6 - 04.5 %   MCV 88.9  78.0 - 100.0 fL   MCH 32.4  26.0 - 34.0 pg   MCHC 36.5 (*) 30.0 - 36.0 g/dL   RDW 40.9  81.1 - 91.4 %   Platelets 182  150 - 400 K/uL   Neutrophils Relative % 65  43 - 77 %   Neutro Abs 4.1  1.7 - 7.7 K/uL   Lymphocytes Relative 24  12 - 46 %   Lymphs Abs 1.5  0.7 - 4.0 K/uL   Monocytes Relative 11  3 - 12 %   Monocytes Absolute 0.7  0.1 - 1.0 K/uL   Eosinophils Relative 1  0 - 5 %   Eosinophils Absolute 0.0  0.0 - 0.7 K/uL   Basophils Relative 0  0 - 1 %   Basophils Absolute 0.0  0.0 - 0.1 K/uL  POCT I-STAT 3, BLOOD GAS (G3+)     Status: Abnormal   Collection Time    09/11/13  2:35 PM      Result Value Range   pH, Arterial 7.517 (*) 7.350 - 7.450   pCO2 arterial 28.7 (*) 35.0 - 45.0 mmHg   pO2, Arterial 61.0 (*) 80.0 - 100.0 mmHg   Bicarbonate 23.2  20.0 - 24.0 mEq/L   TCO2 24  0 - 100 mmol/L   O2 Saturation 94.0     Acid-Base Excess 2.0  0.0 - 2.0 mmol/L   Sample type ARTERIAL    POCT I-STAT 3, BLOOD GAS (G3P V)     Status: Abnormal   Collection Time    09/11/13  2:35 PM      Result Value Range   pH, Ven 7.496 (*) 7.250 - 7.300   pCO2, Ven 28.6 (*) 45.0 - 50.0 mmHg   pO2, Ven 35.0  30.0 - 45.0 mmHg   Bicarbonate 22.1  20.0 - 24.0 mEq/L   TCO2 23  0 - 100 mmol/L   O2 Saturation 74.0     Sample type VENOUS     Comment NOTIFIED PHYSICIAN     X-ray Chest Pa And Lateral  09/11/2013   CLINICAL DATA:  Shortness of breath and bradycardia.  EXAM: CHEST  2 VIEW  COMPARISON:  12/06/2012.  FINDINGS: Chronic aortic tortuosity. No cardiomegaly. New bandlike opacity at the right base. Fine interstitial opacities at the bases are stable from prior, likely scarring and chronic bronchial thickening. The lungs remain hyperinflated. No effusion or  pneumothorax. No acute osseous findings.  IMPRESSION: 1. No evidence of acute cardiopulmonary disease. 2. Mild right base atelectasis. 3. Suspect COPD.   Electronically Signed   By: Tiburcio Pea   On: 09/11/2013 06:00   Ct Head Wo Contrast  09/11/2013   *RADIOLOGY REPORT*  Clinical Data:  Code stroke.  Left-sided gaze.  Altered mental status.  CT HEAD WITHOUT CONTRAST  Technique:  Contiguous axial images were obtained from the base of the skull through the vertex without contrast.  Comparison: Report of prior CT scan dated 11/15/2001  Findings: There is no acute intracranial hemorrhage, infarction, or mass lesion.  There is a large old lacunar infarct in the mid pons. There is diffuse mild cerebral cortical atrophy with secondary ventricular dilatation.  No acute osseous abnormality.  Chronic postoperative changes of the left mastoid.  IMPRESSION: No acute intracranial abnormality.  Old pontine infarct.   Original Report Authenticated By: Francene Boyers, M.D.      Assessment: 79 y.o. male presenting with AMS and agitation S/P cardiac catheterization. Initial CT head showed no acute stroke or large artery occlusion. Exam indicates no focal or lateralizing deficits but rather confusion and bilateral shivering.  Etiology unclear at present time.  Given exam stroke is less likely thus tPA was not given and code stroke was cancled.  Will obtain EEG to evaluate for possible seizure.    Recommend: 1) MRI brain without contrast 2) EEG --ordered   Stroke Risk Factors - diabetes mellitus and hypertension  Felicie Morn PA-C [facility] [phone]  09/11/2013, 4:57 PM  Patient was personally evaluated by me including neurological examination as well as formulation of the above clinical assessment and management recommendations.  Venetia Maxon M.D. [facility] [phone]

## 2013-09-11 NOTE — Consult Note (Addendum)
PULMONARY  / CRITICAL CARE MEDICINE  Name: [name] MRN: [id number] DOB: 02-21-1933    ADMISSION DATE:  09/10/2013 CONSULTATION DATE:  09/11/2013  REFERRING MD :  Wyline Mood [facility] PRIMARY SERVICE: PCCM  CHIEF COMPLAINT:  Confusion  BRIEF PATIENT DESCRIPTION: 79 y/o male admitted on 9/16 for syncope in the setting of bradycardia developed acute confusion and hypertension after his left and right heart catheterization on 9/17.  SIGNIFICANT EVENTS / STUDIES:  9/16 admission 9/17 RHC/LHC > RA 3 mmHg RV 37/1 mmHg PA 33/12 mmHg PCWP 6 mmHg LV 201/4 mHg . LVEDP: 12 mmHg AO 202/94 mmHg Oxygen saturations: PA 74% AO 94% Cardiac Output (Fick) 6.72 Cardiac Index (Fick) 3.22 Pulmonary vascular resistance (PVR): 1.9 Woods units. LVEF normal; no obstructive CAD in main arteries 9/17 CT head > NAICP, old pontine infarct 9/17 EEG > non-specific slowing of cerebral activity  LINES / TUBES:   CULTURES: 9/17 blood >> 9/17 urine >>  ANTIBIOTICS:   HISTORY OF PRESENT ILLNESS:  79 y/o male admitted on 9/16 for syncope in the setting of bradycardia developed acute confusion and hypertension after his left and right heart catheterization on 9/17.  History is obtained by chart review because he is confused.  He was apparently feeling well prior to the heart catheterization but during the study he developed SVT.  Afterwards he was noted to be acutely confused and a Code Stroke was called.  A head CT was negative and EEG showed non-specific slowing.  Notably, clonidine was held on admission for concern that it may have contributed to his bradycardia.  At home he took 0.2 mg bid with instructions to take an extra tablet if his BP was greater than 170.    PAST MEDICAL HISTORY :  Past Medical History  Diagnosis Date  . Allergic rhinitis   . Obesity   . Essential hypertension, benign   . Diaphragmatic hernia without mention of obstruction or gangrene   . Anxiety   . Anemia   . Elevated liver function  tests   . Diastolic congestive heart failure   . OSA on CPAP     "wears mask part of the time" (09/10/2013)  . Diabetes mellitus     "diet controlled" (09/10/2013)  . Arthritis     "all over my body" (09/10/2013)  . Osteoporosis   . Chronic lower back pain   . Depression   . BPH (benign prostatic hypertrophy)    Past Surgical History  Procedure Laterality Date  . Cardiac catheterization  1998  . Cataract extraction w/ intraocular lens  implant, bilateral  2010  . Stomach surgery  03/02/2012    ?volvus; "stomach came loose and twisted; went up under rib cage; had that corrected; tacked it up" (09/10/2013)   Prior to Admission medications   Medication Sig Start Date End Date Taking? Authorizing Provider  acetaminophen (TYLENOL) 500 MG tablet Take 500 mg by mouth every 6 (six) hours as needed for pain.   Yes Historical Provider, MD  alendronate (FOSAMAX) 70 MG tablet Take 70 mg by mouth every 7 (seven) days. Take with a full glass of water on an empty stomach. On Mondays   Yes Historical Provider, MD  aspirin 81 MG tablet Take 81 mg by mouth daily.     Yes Historical Provider, MD  Calcium Carbonate-Vitamin D (CALCIUM 600 + D PO) Take 600 mg by mouth 2 (two) times daily.   Yes Historical Provider, MD  cloNIDine (CATAPRES) 0.1 MG tablet Take 0.1 mg  by mouth 2 (two) times daily. If BP is over 170 (top number), take extra If BP is less than 140 (top number), DO NOT TAKE   Yes Historical Provider, MD  enalapril (VASOTEC) 20 MG tablet Take 20 mg by mouth 2 (two) times daily.     Yes Historical Provider, MD  fish oil-omega-3 fatty acids 1000 MG capsule Take 1 g by mouth 2 (two) times daily.     Yes Historical Provider, MD  furosemide (LASIX) 40 MG tablet Take 40 mg by mouth daily.    Yes Historical Provider, MD  losartan (COZAAR) 100 MG tablet Take 1 tablet by mouth daily. 11/16/11  Yes Historical Provider, MD  metoprolol (LOPRESSOR) 50 MG tablet Take 50 mg by mouth 2 (two) times daily.    Yes  Historical Provider, MD  Multiple Vitamin (MULTIVITAMIN WITH MINERALS) TABS tablet Take 1 tablet by mouth daily.   Yes Historical Provider, MD  NONFORMULARY OR COMPOUNDED ITEM Apply 1 application topically 2 (two) times daily. Diclo 3% / baclo 2% / cyclo 2% / tetracaine 2% compounded cream   Yes Historical Provider, MD  potassium chloride (KLOR-CON) 10 MEQ CR tablet Take 10 mEq by mouth daily.     Yes Historical Provider, MD  tamsulosin (FLOMAX) 0.4 MG CAPS capsule Take 0.4 mg by mouth 2 (two) times daily.    Yes Historical Provider, MD  traMADol (ULTRAM) 50 MG tablet Take 50 mg by mouth at bedtime.   Yes Historical Provider, MD   No Known Allergies  FAMILY HISTORY: SOCIAL HISTORY:REVIEW OF SYSTEMS:  Cannot obtain due to confusion  SUBJECTIVE:   VITAL SIGNS: Temp:  [97 F (36.1 C)-100 F (37.8 C)] 100 F (37.8 C) (09/17 2100) Pulse Rate:  [55-144] 120 (09/17 2149) Resp:  [13-40] 27 (09/17 2149) BP: (148-224)/(86-164) 162/97 mmHg (09/17 2149) SpO2:  [96 %-100 %] 98 % (09/17 2149) Weight:  [85.049 kg (187 lb 8 oz)-86.635 kg (190 lb 15.9 oz)] 85.049 kg (187 lb 8 oz) (09/17 0449) HEMODYNAMICS:   VENTILATOR SETTINGS:   INTAKE / OUTPUT: Intake/Output     09/17 0701 - 09/18 0700   P.O.    I.V. (mL/kg) 229.2 (2.7)   Total Intake(mL/kg) 229.2 (2.7)   Urine (mL/kg/hr)    Total Output     Net +229.2       Urine Occurrence 1 x     PHYSICAL EXAMINATION:  Gen: confused, pulling at lines HEENT: NCAT, PERRL, EOMi, PULM: CTA B, but diminished on left CV: RRR, no mgr, no JVD AB: BS+, soft, nontender, no hsm Ext: warm, trace edema, no clubbing, no cyanosis Derm: no rash or skin breakdown Neuro: Awake, opens eyes to voice, does not follow commands, non verbal, moving all four extremities   LABS:  CBC Recent Labs     09/10/13  1450  WBC  6.4  HGB  15.2  HCT  41.7  PLT  182   Coag's Recent Labs     09/10/13  1450  INR  1.07   BMET Recent Labs     09/10/13  1450  NA   139  K  3.7  CL  102  CO2  26  BUN  21  CREATININE  0.97  GLUCOSE  103*   Electrolytes Recent Labs     09/10/13  1450  CALCIUM  9.8   Sepsis Markers No results found for this basename: LACTICACIDVEN, PROCALCITON, O2SATVEN,  in the last 72 hours ABG Recent Labs     09/11/13  1435  09/11/13  2049  PHART  7.517*  7.569*  PCO2ART  28.7*  22.4*  PO2ART  61.0*  67.0*   Liver Enzymes Recent Labs     09/10/13  1450  AST  17  ALT  11  ALKPHOS  58  BILITOT  0.7  ALBUMIN  3.9   Cardiac Enzymes No results found for this basename: TROPONINI, PROBNP,  in the last 72 hours Glucose Recent Labs     09/11/13  2004  GLUCAP  202*    CXR: severely rotated, no clear change from prior EKG: 1st degree AVB, LAD, QTc 486  ASSESSMENT / PLAN:  NEUROLOGIC A:  Acute encephalopathy on hospital day 2 post LHC.  At this point no clear etiology identified; favor clonidine withdrawal, less likely cholesterol embolism.  Sundowning also possible.   P:   -restart clonidine -agree with MRI -no precedex given bradycardia issues -check CBC with diff to look for eosinophilia (cholesterol emboli?) -would use minimal haldol insetting of severe agitation, careful with QTc -hold benzos  PULMONARY A: No acute issues, minimal breath sounds on left, atelectasis? P:   -repeat Chest X-ray, last study too rotated  CARDIOVASCULAR A: Hypertension > now presumably clonidine withdrawal Bradycardia, TAchy-brady syndrome CHF > not in exacerbation P:  -clonidine patch -coreg -nicardipine gtt titrated to MAP 140-160 -tele  RENAL A:  No acute issues P:   -repeat BMET  GASTROINTESTINAL A:  No acute issues P:     HEMATOLOGIC A:  NO acute issues P:  -check CBC with diff now  INFECTIOUS A:  Low grade temp, presumably related to clonidine withdrawal P:   -APAP prn -pan culture -u/a  ENDOCRINE A:  DM2 P:   -SSI   TODAY'S SUMMARY:   I have personally obtained a history, examined  the patient, evaluated laboratory and imaging results, formulated the assessment and plan and placed orders. CRITICAL CARE: The patient is critically ill with multiple organ systems failure and requires high complexity decision making for assessment and support, frequent evaluation and titration of therapies, application of advanced monitoring technologies and extensive interpretation of multiple databases. Critical Care Time devoted to patient care services described in this note is 60 minutes.   Fonnie Jarvis Pulmonary and Critical Care Medicine [facility] Pager: [phone]  09/11/2013, 10:04 PM

## 2013-09-11 NOTE — Progress Notes (Signed)
Utilization review completed.  

## 2013-09-11 NOTE — Procedures (Signed)
ELECTROENCEPHALOGRAM REPORT   Patient: [name]       Room #: 4U98 EEG No. ID: [id number] Age: 79 y.o.        Sex: male Referring Physician: Branch Report Date:  09/11/2013        Interpreting Physician: Aline Brochure  History: [name] is an 79 y.o. male admitted to cardiology service for investigation of bradycardia. Patient underwent cardiac catheterization earlier today. He developed sudden change in mental status with delirium and marked tremulousness.   Indications for study: Assess severity of encephalopathy; rule out focal seizure activity.   Technique: This is an 18 channel routine scalp EEG performed at the bedside with bipolar and monopolar montages arranged in accordance to the international 10/20 system of electrode placement.   Description: EEG was performed during wakefulness. Patient was noted to be markedly confused during the study as well as tremulous intermittently. Background activity consists of one to 2 Hz low amplitude diffuse symmetrical delta activity with superimposed 6-7 Hz theta activity diffusely, as well. Photic stimulation was not performed. No epileptiform discharges were recorded.  Interpretation: CT is abnormal with moderate continuous nonspecific slowing of cerebral activity. No evidence of epileptic activity was recorded.   Venetia Maxon M.D. [facility] [phone]

## 2013-09-11 NOTE — Interval H&P Note (Signed)
History and Physical Interval Note:  09/11/2013 2:14 PM  [name]  has presented today for surgery, with the diagnosis of cp  The various methods of treatment have been discussed with the patient and family. After consideration of risks, benefits and other options for treatment, the patient has consented to  Procedure(s): LEFT AND RIGHT HEART CATHETERIZATION WITH CORONARY ANGIOGRAM (N/A) as a surgical intervention .  The patient's history has been reviewed, patient examined, no change in status, stable for surgery.  I have reviewed the patient's chart and labs.  Questions were answered to the patient's satisfaction.     Lorine Bears

## 2013-09-11 NOTE — Progress Notes (Addendum)
Nurse called to room for complaints of burning, midsternal CP rated 7/10 with SOB. Vital signs obtained and documented in doc flowsheet. EKG obtained. SR with 1st degree block. 2L 02 via nasal cannula placed on patient. Brooke, PA notified. 1 SL nitro administered with relief. Will continue to monitor. Marjie Skiff, RN

## 2013-09-11 NOTE — Progress Notes (Signed)
I was called post cath for mental status change.  I personally evaluated the patient. He was awake but not interactive with possible left sided gaze. He was able to move all extremities. A code stroke was called. I discussed the case with Dr. Roseanne Reno. The patient was taken for CT head.  It is not entirely clear if this is a stroke or drug reaction. Will continue to follow.

## 2013-09-11 NOTE — Progress Notes (Signed)
EEG completed as a STAT bedside; results pending

## 2013-09-11 NOTE — CV Procedure (Signed)
Cardiac Catheterization Procedure Note  Name: [name] MRN: [id number] DOB: [birth year]  Procedure: Right Heart Cath, Left Heart Cath, Selective Coronary Angiography, LV angiography  Indication: Exertional shortness of breath with new cardiomyopathy   Procedural Details: The right groin was prepped, draped, and anesthetized with 1% lidocaine. Using the modified Seldinger technique a 5 French sheath was placed in the right femoral artery and a 7 French sheath was placed in the right femoral vein. A Swan-Ganz catheter was used for the right heart catheterization. Standard protocol was followed for recording of right heart pressures and sampling of oxygen saturations. Fick cardiac output was calculated. Standard Judkins catheters were used for selective coronary angiography and left ventriculography. The arterial sheath was removed and the site was closed with a Mynx device. There were no immediate procedural complications. The patient was transferred to the post catheterization recovery area for further monitoring.  Procedural Findings: Hemodynamics RA 3 mmHg RV 37/1 mmHg PA 33/12 mmHg PCWP 6 mmHg LV  201/4 mHg . LVEDP:  12 mmHg AO  202/94 mmHg  Oxygen saturations: PA  74% AO  94%  Cardiac Output (Fick)  6.72  Cardiac Index (Fick)  3.22  Pulmonary vascular resistance (PVR):  1.9 Woods units.  Coronary angiography: Coronary dominance:  right   Left Main:   Normal in size with no significant disease.  Left Anterior Descending (LAD):   Large in size with mild diffuse atherosclerosis. There is diffuse 20% disease in the midsegment.  1st diagonal (D1):   Normal in size with 80% ostial stenosis followed by a 95% proximal stenosis.  2nd diagonal (D2):   Medium in size with minor irregularities.  3rd diagonal (D3):   Normal in size with no significant disease.  Circumflex (LCx):   Normal in size and nondominant. There is 20% mid stenosis.  1st obtuse marginal:   Medium in  size with minor irregularities.  2nd obtuse marginal:   Large is in size with no significant disease.  3rd obtuse marginal:   Normal in size with minor irregularities.   Right Coronary Artery:  large in size and dominant. There is 20% stenosis distally.  Posterior descending artery:  normal in size with no significant disease.  Posterior AV segment:  normal in size with no significant disease.  Posterolateral branchs:   PL 1 is large with no significant disease. PL 2  is small.  Left ventriculography: Left ventricular systolic function is  normal , LVEF is estimated at  60 %, there is  no significant mitral regurgitation   Final Conclusions:   1. No evidence of obstructive coronary artery disease in the main arteries. There is significant disease in a normal size first diagonal branch which likely does not explain the patient's symptoms. 2. Normal LV systolic function with normal left ventricular end-diastolic pressure. 3. Mild pulmonary hypertension with normal cardiac output.  Recommendations:   While the patient was in the table in before starting the case, he went into SVT with a heart rate of 140 but converted spontaneously to sinus rhythm. After finishing the case, he again developed SVT with a heart rate of 145. This lasted longer and was associated with significant chest discomfort and shortness of breath. He converted from this without intervention but went into junctional bradycardia with a heart rate of 43 beats per minute. Based on this, the patient seems to have symptomatic tachybradycardia syndrome. Thus, a permanent pacemaker is recommended. This was discussed with Dr. Ladona Ridgel. In the meanwhile, the patient  needs blood pressure control.  Small dose carvedilol was started given his episodes of tachycardia. This can be uptitrated after placing the pacemaker.  Lorine Bears MD, [facility] 09/11/2013, 3:12 PM

## 2013-09-11 NOTE — Progress Notes (Signed)
eLink Physician Progress Note and Electrolyte Replacement  Patient Name: [name] DOB: [birth year] MRN: [id number]  Date of Service  09/11/2013   HPI/Events of Note    Recent Labs Lab 09/10/13 1450 09/11/13 2229  NA 139 139  K 3.7 3.2*  CL 102 101  CO2 26 20  GLUCOSE 103* 203*  BUN 21 17  CREATININE 0.97 0.92  CALCIUM 9.8 9.0    Estimated Creatinine Clearance: 74.8 ml/min (by C-G formula based on Cr of 0.92).  Intake/Output     09/17 0701 - 09/18 0700   P.O.    I.V. (mL/kg) 629.2 (7.4)   Total Intake(mL/kg) 629.2 (7.4)   Urine (mL/kg/hr) 500 (0.3)   Total Output 500   Net +129.2       Urine Occurrence 1 x    - I/O DETAILED x 24h    Total I/O In: 629.2 [I.V.:629.2] Out: 500 [Urine:500] - I/O THIS SHIFT    ASSESSMENT Low k  eICURN Interventions  kcl x  4 runs Check mag and phos   ASSESSMENT: MAJOR ELECTROLYTE      Dr. Kalman Shan, M.D., [facility].C.P Pulmonary and Critical Care Medicine Staff Physician [facility] [place] Pulmonary and Critical Care Pager: [phone], If no answer or between  15:00h - 7:00h: call [phone]  09/11/2013 11:51 PM

## 2013-09-12 ENCOUNTER — Inpatient Hospital Stay (HOSPITAL_COMMUNITY): Payer: Medicare Other

## 2013-09-12 ENCOUNTER — Other Ambulatory Visit: Payer: Self-pay

## 2013-09-12 ENCOUNTER — Ambulatory Visit (HOSPITAL_COMMUNITY): Admission: RE | Admit: 2013-09-12 | Payer: Medicare Other | Source: Ambulatory Visit | Admitting: Internal Medicine

## 2013-09-12 DIAGNOSIS — I495 Sick sinus syndrome: Secondary | ICD-10-CM

## 2013-09-12 DIAGNOSIS — R55 Syncope and collapse: Secondary | ICD-10-CM

## 2013-09-12 DIAGNOSIS — E876 Hypokalemia: Secondary | ICD-10-CM

## 2013-09-12 DIAGNOSIS — I10 Essential (primary) hypertension: Secondary | ICD-10-CM

## 2013-09-12 LAB — GLUCOSE, CAPILLARY
Glucose-Capillary: 176 mg/dL — ABNORMAL HIGH (ref 70–99)
Glucose-Capillary: 121 mg/dL — ABNORMAL HIGH (ref 70–99)
Glucose-Capillary: 120 mg/dL — ABNORMAL HIGH (ref 70–99)
Glucose-Capillary: 142 mg/dL — ABNORMAL HIGH (ref 70–99)

## 2013-09-12 LAB — PHOSPHORUS: Phosphorus: 2.6 mg/dL (ref 2.3–4.6)

## 2013-09-12 MED ORDER — ATROPINE SULFATE 0.1 MG/ML IJ SOLN
INTRAMUSCULAR | Status: AC
  Filled 2013-09-12: qty 10

## 2013-09-12 MED ORDER — HYDRALAZINE HCL 20 MG/ML IJ SOLN
10.0000 mg | INTRAMUSCULAR | Status: DC | PRN
  Administered 2013-09-12 – 2013-09-14 (×7): 20 mg via INTRAVENOUS
  Filled 2013-09-12: qty 1
  Filled 2013-09-12: qty 2
  Filled 2013-09-12 (×4): qty 1

## 2013-09-12 MED ORDER — DEXMEDETOMIDINE HCL IN NACL 200 MCG/50ML IV SOLN
0.2000 ug/kg/h | INTRAVENOUS | Status: DC
  Administered 2013-09-12: 0.2 ug/kg/h via INTRAVENOUS
  Filled 2013-09-12: qty 50

## 2013-09-12 NOTE — Progress Notes (Signed)
PULMONARY  / CRITICAL CARE MEDICINE  Name: [name] MRN: [id number] DOB: February 09, 1933    ADMISSION DATE:  09/10/2013 CONSULTATION DATE:  09/11/2013  REFERRING MD :  Wyline Mood [facility] PRIMARY SERVICE: PCCM  CHIEF COMPLAINT:  Confusion  BRIEF PATIENT DESCRIPTION: 79 y/o male admitted on 9/16 for syncope in the setting of bradycardia developed acute confusion and hypertension after his left and right heart catheterization on 9/17. A head CT was negative and EEG showed non-specific slowing.  Notably, clonidine was held on admission for concern that it may have contributed to his bradycardia.  At home he took 0.2 mg bid with instructions to take an extra tablet if his BP was greater than 170.  SIGNIFICANT EVENTS / STUDIES:  9/16 admission 9/17 RHC/LHC > RA 3 mmHg RV 37/1 mmHg PA 33/12 mmHg PCWP 6 mmHg LV 201/4 mHg . LVEDP: 12 mmHg AO 202/94 mmHg Oxygen saturations: PA 74% AO 94% Cardiac Output (Fick) 6.72 Cardiac Index (Fick) 3.22 Pulmonary vascular resistance (PVR): 1.9 Woods units. LVEF normal; no obstructive CAD in main arteries 9/17 CT head > NAICP, old pontine infarct 9/17 EEG > non-specific slowing of cerebral activity  LINES / TUBES:   CULTURES: 9/17 blood >> 9/17 urine >>  ANTIBIOTICS:    SUBJECTIVE: Febrile, confused Has mittens on Received ativan overnight  VITAL SIGNS: Temp:  [97 F (36.1 C)-101 F (38.3 C)] 99.7 F (37.6 C) (09/18 0400) Pulse Rate:  [80-144] 104 (09/18 0648) Resp:  [12-40] 26 (09/18 0648) BP: (117-224)/(43-164) 141/84 mmHg (09/18 0648) SpO2:  [96 %-100 %] 97 % (09/18 0648) Weight:  [91.2 kg (201 lb 1 oz)] 91.2 kg (201 lb 1 oz) (09/18 0408) HEMODYNAMICS:   VENTILATOR SETTINGS:   INTAKE / OUTPUT: Intake/Output     09/17 0701 - 09/18 0700 09/18 0701 - 09/19 0700   P.O.     I.V. (mL/kg) 1770 (19.4)    IV Piggyback 400    Total Intake(mL/kg) 2170 (23.8)    Urine (mL/kg/hr) 800 (0.4)    Total Output 800     Net +1370          Urine  Occurrence 2 x      PHYSICAL EXAMINATION:  Gen: confused, pulling at lines HEENT: NCAT, PERRL, EOMi, PULM: CTA B, but diminished on left CV: RRR, no mgr, no JVD AB: BS+, soft, nontender, no hsm Ext: warm, trace edema, no clubbing, no cyanosis Derm: no rash or skin breakdown Neuro: Awake, opens eyes to voice, does not follow commands, occasional monosyllable response, moving all four extremities   LABS:  CBC Recent Labs     09/10/13  1450  09/11/13  2229  WBC  6.4  10.7*  HGB  15.2  17.1*  HCT  41.7  46.9  PLT  182  192   Coag's Recent Labs     09/10/13  1450  INR  1.07   BMET Recent Labs     09/10/13  1450  09/11/13  2229  NA  139  139  K  3.7  3.2*  CL  102  101  CO2  26  20  BUN  21  17  CREATININE  0.97  0.92  GLUCOSE  103*  203*   Electrolytes Recent Labs     09/10/13  1450  09/11/13  2229  09/12/13  0420  CALCIUM  9.8  9.0   --   MG   --    --   2.0  PHOS   --    --  2.6   Sepsis Markers No results found for this basename: LACTICACIDVEN, PROCALCITON, O2SATVEN,  in the last 72 hours ABG Recent Labs     09/11/13  1435  09/11/13  2049  PHART  7.517*  7.569*  PCO2ART  28.7*  22.4*  PO2ART  61.0*  67.0*   Liver Enzymes Recent Labs     09/10/13  1450  09/11/13  2229  AST  17  19  ALT  11  13  ALKPHOS  58  60  BILITOT  0.7  1.3*  ALBUMIN  3.9  4.3   Cardiac Enzymes No results found for this basename: TROPONINI, PROBNP,  in the last 72 hours Glucose Recent Labs     09/11/13  1541  09/11/13  2004  09/11/13  2329  09/12/13  0352  GLUCAP  172*  202*  176*  139*    CXR: severely rotated, no clear change from prior EKG: 1st degree AVB, LAD, QTc 486  ASSESSMENT / PLAN:  NEUROLOGIC A:  Acute encephalopathy on hospital day 2 post LHC. Head CT - old pontine CVA. At this point no clear etiology identified; DD includes BDZ induced delerium, clonidine withdrawal, less likely cholesterol embolism.  Sundowning also possible.   P:   -dc  clonidine & use precedex gtt - w/f bradycardia -Use haldol prn , but limited by qT -agree with MRI -would use minimal haldol insetting of severe agitation, careful with QTc -hold benzos  PULMONARY A: No acute issues, minimal breath sounds on left, atelectasis? P:   -follow  CARDIOVASCULAR A: Hypertension > now presumably clonidine withdrawal Bradycardia, TAchy-brady syndrome CHF > not in exacerbation P:  -dc clonidine patch -hold coreg due to sinus pauses -nicardipine gtt if needed or use hydralazine prn  titrated to MAP 140-160 -tele  RENAL A:  No acute issues P:   -repeat BMET  GASTROINTESTINAL A:  No acute issues P:     HEMATOLOGIC A:  NO acute issues P:    INFECTIOUS A:  Low grade temp, presumably related to clonidine withdrawal UA neg P:   -APAP prn -pan culture   ENDOCRINE A:  DM2 P:   -SSI   TODAY'S SUMMARY:   I have personally obtained a history, examined the patient, evaluated laboratory and imaging results, formulated the assessment and plan and placed orders. CRITICAL CARE: The patient is critically ill with multiple organ systems failure and requires high complexity decision making for assessment and support, frequent evaluation and titration of therapies, application of advanced monitoring technologies and extensive interpretation of multiple databases. Critical Care Time devoted to patient care services described in this note is 31 minutes.   [facility]MD Pulmonary and Critical Care Medicine [facility] Pager: [phone]  09/12/2013, 9:55 AM

## 2013-09-12 NOTE — Progress Notes (Signed)
Pt had approx 6sec pause.  Dr Johney Frame at the bedside and witnessed the episode.  Pt had pulse and did not appear to have any negative effects (pt is confused so this is difficult to assess).  BP stable.  Zoll already in the room, but pads applied to patient and now pt remains connected to Zoll.  Atropine at the bedside per MD request.  Precedex D/C after the pause.    Eliane Decree, RN

## 2013-09-12 NOTE — Progress Notes (Signed)
Patient received 2mg  Haldol @ 0233 but still agitated and pulling on devices. At this time I had administered additional 2mg  of iv haldol.  Jorge Ny [place]

## 2013-09-12 NOTE — Progress Notes (Signed)
Subjective: Confusion and agitation have continued, requiring sedation as well as restraints with hand mittens.  Objective: Current vital signs: BP 141/84  Pulse 104  Temp(Src) 99.7 F (37.6 C) (Axillary)  Resp 26  Ht 6\' 1"  (1.854 m)  Wt 91.2 kg (201 lb 1 oz)  BMI 26.53 kg/m2  SpO2 97%  Neurologic Exam: Patient was sleeping and couldn't be aroused. He received Ativan earlier, as well as Precedex. Pupils were equal and reacted normally to light. Extraocular movements were intact and conjugate with oculocephalic maneuvers. No focal facial weakness noted. Withdrew extremities equally to noxious stimuli.  EEG on 09/11/2013 showed generalized nonspecific slowing. No epileptiform discharges were recorded.  Medications: I have reviewed the patient's current medications.  Assessment/Plan: Delirium of unclear etiology. Patient has no indications of an acute stroke nor indications of significant metabolic or infectious process. Reaction to medication is a consideration, but unclear which medication could be the likely culprit. MRI of the brain is pending.  Recommend no changes in current management, including sedation and restraints as needed. We will continue to follow this patient with you.  C.R. Roseanne Reno, MD [facility] [phone] 09/12/2013  9:46 AM

## 2013-09-12 NOTE — Progress Notes (Signed)
Ativan 0.25mg  administered at this time due to agitation. Mitts on patient's hands, he has been trying to pull the mitts of, pulls on IV tubing, condom catheter and monitor leads. Very agitated when touched. Unable to do mouth care safely.

## 2013-09-12 NOTE — Progress Notes (Signed)
Patient: [name] Date of Encounter: 09/12/2013, 7:58 AM Admit date: 09/10/2013     Subjective  Mr. Parco has had significant mental status changes in last 18 hours. He was agitated overnight, now improved with Ativan. Neuro and PCCM are following.    Objective  Physical Exam: Vitals: BP 141/84  Pulse 104  Temp(Src) 99.7 F (37.6 C) (Axillary)  Resp 26  Ht 6\' 1"  (1.854 m)  Wt 201 lb 1 oz (91.2 kg)  BMI 26.53 kg/m2  SpO2 97% General: Well developed 79 year old male in no acute distress.  Neck: Supple. JVD not elevated. Lungs: Clear bilaterally to auscultation without wheezes, rales, or rhonchi. Breathing is unlabored. Heart: RRR S1 S2 without murmurs, rubs, or gallops.  Abdomen: Soft, non-distended. Extremities: No clubbing or cyanosis. No edema.    Intake/Output:  Intake/Output Summary (Last 24 hours) at 09/12/13 0758 Last data filed at 09/12/13 0600  Gross per 24 hour  Intake 2170.01 ml  Output    800 ml  Net 1370.01 ml    Inpatient Medications:  . aspirin  243 mg Oral Once  . aspirin EC  81 mg Oral Daily  . carvedilol  3.125 mg Oral BID WC  . cloNIDine  0.2 mg Transdermal Weekly  . enalapril  20 mg Oral BID  . enoxaparin (LOVENOX) injection  40 mg Subcutaneous Q24H  . furosemide  40 mg Oral Daily  . insulin aspart  0-15 Units Subcutaneous Q4H  . losartan  100 mg Oral Daily  . multivitamin with minerals  1 tablet Oral Daily  . omega-3 acid ethyl esters  1 g Oral BID  . potassium chloride  10 mEq Oral Daily  . sodium chloride  3 mL Intravenous Q12H  . tamsulosin  0.4 mg Oral QPC supper   . sodium chloride 100 mL/hr at 09/11/13 2129  . niCARDipine Stopped (09/12/13 0430)    Labs:  Recent Labs  09/10/13 1450 09/11/13 2229 09/12/13 0420  NA 139 139  --   K 3.7 3.2*  --   CL 102 101  --   CO2 26 20  --   GLUCOSE 103* 203*  --   BUN 21 17  --   CREATININE 0.97 0.92  --   CALCIUM 9.8 9.0  --   MG  --   --  2.0  PHOS  --   --  2.6     Recent Labs  09/10/13 1450 09/11/13 2229  AST 17 19  ALT 11 13  ALKPHOS 58 60  BILITOT 0.7 1.3*  PROT 7.5 8.2  ALBUMIN 3.9 4.3    Recent Labs  09/10/13 1450 09/11/13 2229  WBC 6.4 10.7*  NEUTROABS 4.1 9.6*  HGB 15.2 17.1*  HCT 41.7 46.9  MCV 88.9 87.7  PLT 182 192    Recent Labs  09/10/13 1450  TSH 0.964    Recent Labs  09/10/13 1450  INR 1.07    Radiology/Studies: X-ray Chest Pa And Lateral 09/11/2013    IMPRESSION: 1. No evidence of acute cardiopulmonary disease. 2. Mild right base atelectasis. 3. Suspect COPD.    Electronically Signed   By: Tiburcio Pea   On: 09/11/2013 06:00   Ct Head Wo Contrast 09/11/2013    IMPRESSION: No acute intracranial abnormality.  Old pontine infarct.    Original Report Authenticated By: Francene Boyers, M.D.   Dg Chest Port 1 View 09/12/2013    IMPRESSION: Left lower lobe atelectasis. Possible small left pleural effusion.  Stable cardiomegaly.    Original Report Authenticated By: Awilda Metro    Cardiac cath yesterday:  Final Conclusions:  1. No evidence of obstructive coronary artery disease in the main arteries. There is significant disease in a normal size first diagonal branch which likely does not explain the patient's symptoms.  2. Normal LV systolic function with normal left ventricular end-diastolic pressure.  3. Mild pulmonary hypertension with normal cardiac output. 4. LVEF normal - 60%   Telemetry: SR with first degree AV block    Assessment and Plan  1. AMS - appreciate PCCM and Neuro assistance - spoke with Dr. Vassie Loll who suspects acute delirium from benzodiazipine use in setting of old stroke - plans to stop Ativan and give Precedex with close monitoring of rhythm - if no improvement in 24 hours, plan for brain MRI 2. Sinus node dysfunction / sinus pauses - rhythm stable currently off metoprolol - will most likely need PPM but deferred at this time until mental status improves 3. CAD s/p  cardiac cath yesterday 4. Normal LVEF by cath (was estimated to be 40-45% by echo August 2014) 5. HTN  Dr. Johney Frame to see. Please see recommendations below. Signed, EDMISTEN, BROOKE PA-C  I have seen, examined the patient, and reviewed the above assessment and plan.  Changes to above are made where necessary.  The patient has had acute ecephalopathy of unknown etiology requiring aggressive intervention.  Off of AV nodal agents he continues to have pauses of > 4 seconds.  He will require PPM once clinically stable.  Unfortunately, he is too unstable for PPM at this time due to his acute encephalopathy. I would advised that we keep Zoll pads on the patient and atropine at the bedside. Would avoid precedex which can cause severe bradycardia.  Also avoid any chronotropic agents.  Co Sign: Hillis Range, MD 09/12/2013 10:42 AM

## 2013-09-12 NOTE — Progress Notes (Signed)
Patient very agitated this morning despite having ativan at 0651. Unable to do EKG at this time due to movement and agitation. [name]

## 2013-09-12 NOTE — Progress Notes (Signed)
Bladder scanner showed approximately  of urine in patient's bladder. Patient has had condom catheter since last night (replaced multiple times) and put out urine through out the night. Will continue to monitor his urine output and follow up as needed. Jorge Ny [place]

## 2013-09-13 ENCOUNTER — Encounter (HOSPITAL_COMMUNITY)
Admission: AD | Disposition: A | Discharge status: Home / Self Care | Payer: Medicare Other | Source: Ambulatory Visit | Attending: Cardiology

## 2013-09-13 ENCOUNTER — Other Ambulatory Visit: Payer: Self-pay

## 2013-09-13 DIAGNOSIS — I509 Heart failure, unspecified: Secondary | ICD-10-CM

## 2013-09-13 DIAGNOSIS — I495 Sick sinus syndrome: Secondary | ICD-10-CM

## 2013-09-13 HISTORY — PX: PERMANENT PACEMAKER INSERTION: SHX5480

## 2013-09-13 LAB — CBC
HCT: 45.1 % (ref 39.0–52.0)
Hemoglobin: 16.3 g/dL (ref 13.0–17.0)
MCH: 32.7 pg (ref 26.0–34.0)
MCHC: 36.1 g/dL — ABNORMAL HIGH (ref 30.0–36.0)
MCV: 90.4 fL (ref 78.0–100.0)
Platelets: 167 K/uL (ref 150–400)
RBC: 4.99 MIL/uL (ref 4.22–5.81)
RDW: 14.4 % (ref 11.5–15.5)
WBC: 11.5 K/uL — ABNORMAL HIGH (ref 4.0–10.5)

## 2013-09-13 LAB — PHOSPHORUS: Phosphorus: 1.4 mg/dL — ABNORMAL LOW (ref 2.3–4.6)

## 2013-09-13 LAB — BASIC METABOLIC PANEL WITH GFR
BUN: 21 mg/dL (ref 6–23)
CO2: 17 meq/L — ABNORMAL LOW (ref 19–32)
Calcium: 8.2 mg/dL — ABNORMAL LOW (ref 8.4–10.5)
Chloride: 105 meq/L (ref 96–112)
Creatinine, Ser: 0.84 mg/dL (ref 0.50–1.35)
GFR calc Af Amer: >90 mL/min
GFR calc non Af Amer: 82 mL/min — ABNORMAL LOW
Glucose, Bld: 111 mg/dL — ABNORMAL HIGH (ref 70–99)
Potassium: 3.2 meq/L — ABNORMAL LOW (ref 3.5–5.1)
Sodium: 139 meq/L (ref 135–145)

## 2013-09-13 LAB — URINE CULTURE: Culture: NO GROWTH

## 2013-09-13 LAB — GLUCOSE, CAPILLARY
Glucose-Capillary: 117 mg/dL — ABNORMAL HIGH (ref 70–99)
Glucose-Capillary: 165 mg/dL — ABNORMAL HIGH (ref 70–99)

## 2013-09-13 LAB — MAGNESIUM: Magnesium: 2 mg/dL (ref 1.5–2.5)

## 2013-09-13 SURGERY — PERMANENT PACEMAKER INSERTION
Anesthesia: LOCAL

## 2013-09-13 MED ORDER — FENTANYL CITRATE 0.05 MG/ML IJ SOLN
INTRAMUSCULAR | Status: AC
  Filled 2013-09-13: qty 2

## 2013-09-13 MED ORDER — HEPARIN (PORCINE) IN NACL 2-0.9 UNIT/ML-% IJ SOLN
INTRAMUSCULAR | Status: AC
  Filled 2013-09-13: qty 500

## 2013-09-13 MED ORDER — HYDROCODONE-ACETAMINOPHEN 5-325 MG PO TABS: 1.0000 | ORAL_TABLET | Freq: Four times a day (QID) | ORAL | Status: DC

## 2013-09-13 MED ORDER — CEFAZOLIN SODIUM 1-5 GM-% IV SOLN: 1.0000 g | Freq: Four times a day (QID) | INTRAVENOUS | Status: DC

## 2013-09-13 MED ORDER — SODIUM CHLORIDE 0.9 % IV SOLN: 250.0000 mL | INTRAVENOUS | Status: DC | PRN

## 2013-09-13 MED ORDER — POTASSIUM CHLORIDE CRYS ER 20 MEQ PO TBCR
40.0000 meq | EXTENDED_RELEASE_TABLET | Freq: Three times a day (TID) | ORAL | Status: AC
  Administered 2013-09-13: 23:00:00 40 meq via ORAL
  Filled 2013-09-13: qty 2

## 2013-09-13 MED ORDER — SODIUM CHLORIDE 0.9 % IV SOLN: INTRAVENOUS | Status: DC

## 2013-09-13 MED ORDER — LIDOCAINE HCL (PF) 1 % IJ SOLN
INTRAMUSCULAR | Status: AC
  Filled 2013-09-13: qty 30

## 2013-09-13 MED ORDER — LIDOCAINE HCL (PF) 1 % IJ SOLN
INTRAMUSCULAR | Status: AC
  Filled 2013-09-13: qty 60

## 2013-09-13 MED ORDER — CHLORHEXIDINE GLUCONATE 4 % EX LIQD
60.0000 mL | Freq: Once | CUTANEOUS | Status: AC
  Administered 2013-09-13: 4 via TOPICAL

## 2013-09-13 MED ORDER — CHLORHEXIDINE GLUCONATE 4 % EX LIQD
60.0000 mL | Freq: Once | CUTANEOUS | Status: DC
  Filled 2013-09-13: qty 60

## 2013-09-13 MED ORDER — CEFAZOLIN SODIUM-DEXTROSE 2-3 GM-% IV SOLR
2.0000 g | INTRAVENOUS | Status: DC
  Filled 2013-09-13: qty 50

## 2013-09-13 MED ORDER — ONDANSETRON HCL 4 MG/2ML IJ SOLN: 4.0000 mg | Freq: Four times a day (QID) | INTRAMUSCULAR | Status: DC | PRN

## 2013-09-13 MED ORDER — SODIUM CHLORIDE 0.9 % IJ SOLN
3.0000 mL | Freq: Two times a day (BID) | INTRAMUSCULAR | Status: DC
  Administered 2013-09-13: 21:00:00 3 mL via INTRAVENOUS

## 2013-09-13 MED ORDER — POTASSIUM PHOSPHATE DIBASIC 3 MMOLE/ML IV SOLN
30.0000 mmol | Freq: Once | INTRAVENOUS | Status: AC
  Administered 2013-09-13: 30 mmol via INTRAVENOUS
  Filled 2013-09-13: qty 10

## 2013-09-13 MED ORDER — SODIUM CHLORIDE 0.9 % IJ SOLN: 3.0000 mL | INTRAMUSCULAR | Status: DC | PRN

## 2013-09-13 MED ORDER — METOPROLOL TARTRATE 1 MG/ML IV SOLN
INTRAVENOUS | Status: AC
  Filled 2013-09-13: qty 5

## 2013-09-13 MED ORDER — ACETAMINOPHEN 325 MG PO TABS: 325.0000 mg | ORAL_TABLET | ORAL | Status: DC | PRN

## 2013-09-13 MED ORDER — CEFAZOLIN SODIUM 1-5 GM-% IV SOLN
1.0000 g | Freq: Four times a day (QID) | INTRAVENOUS | Status: AC
  Administered 2013-09-13 – 2013-09-14 (×3): 1 g via INTRAVENOUS
  Filled 2013-09-13 (×3): qty 50

## 2013-09-13 MED ORDER — SODIUM CHLORIDE 0.9 % IR SOLN
80.0000 mg | Status: DC
  Filled 2013-09-13: qty 2

## 2013-09-13 NOTE — CV Procedure (Signed)
DDD PPM insertion via the left subclavian vein without immediate complication. [id number].

## 2013-09-13 NOTE — Progress Notes (Signed)
Pleasant and cooperative, follows commands, IV hydralazine given for BP 170/92, 1st degree HB w/ hr 90's.  Denies pain, states doesn't want pain med if he doesn't have to take it, states his wife told him he was confused earlier after sedation but he doesn't remember it.  Forgetful about keeping left arm still, sling on.  In room in front of RN station.  Bed alarm on, call light in reach, reminded pt not to get up w/o assist or to move left arm, pt voices understanding.

## 2013-09-13 NOTE — Progress Notes (Signed)
   Patient: [name] Date of Encounter: 09/13/2013, 7:25 AM Admit date: 09/10/2013     Subjective  Mr. Broaden' mental status is significantly improved. He had another episode of chest pain this AM. SBP 165, pulse 103. Relieved with SL NTG x 2. No further pauses in nearly 24 hours.   Objective  Physical Exam: Vitals: BP 141/52  Pulse 97  Temp(Src) 98.1 F (36.7 C) (Axillary)  Resp 19  Ht 6' 1" (1.854 m)  Wt 207 lb 14.3 oz (94.3 kg)  BMI 27.43 kg/m2  SpO2 97% General: Well developed 80 year old male in no acute distress.  Neck: Supple. JVD not elevated. Lungs: Clear bilaterally to auscultation without wheezes, rales, or rhonchi. Breathing is unlabored. Heart: RRR S1 S2 without murmurs, rubs, or gallops.  Abdomen: Soft, non-distended. Extremities: No clubbing or cyanosis. No edema. Neuro: Alert and oriented x 3. Strength intact. No focal deficits.    Intake/Output:  Intake/Output Summary (Last 24 hours) at 09/13/13 0725 Last data filed at 09/13/13 0700  Gross per 24 hour  Intake 1532.67 ml  Output   1050 ml  Net 482.67 ml    Inpatient Medications:  . aspirin  243 mg Oral Once  . aspirin EC  81 mg Oral Daily  . enalapril  20 mg Oral BID  . enoxaparin (LOVENOX) injection  40 mg Subcutaneous Q24H  . insulin aspart  0-15 Units Subcutaneous Q4H  . losartan  100 mg Oral Daily  . multivitamin with minerals  1 tablet Oral Daily  . omega-3 acid ethyl esters  1 g Oral BID  . potassium phosphate IVPB (mmol)  30 mmol Intravenous Once  . sodium chloride  3 mL Intravenous Q12H  . tamsulosin  0.4 mg Oral QPC supper   . sodium chloride 50 mL/hr at 09/13/13 0625  . niCARDipine Stopped (09/12/13 0430)    Labs:  Recent Labs  09/11/13 2229 09/12/13 0420 09/13/13 0450  NA 139  --  139  K 3.2*  --  3.2*  CL 101  --  105  CO2 20  --  17*  GLUCOSE 203*  --  111*  BUN 17  --  21  CREATININE 0.92  --  0.84  CALCIUM 9.0  --  8.2*  MG  --  2.0 2.0  PHOS  --  2.6 1.4*     Recent Labs  09/10/13 1450 09/11/13 2229  AST 17 19  ALT 11 13  ALKPHOS 58 60  BILITOT 0.7 1.3*  PROT 7.5 8.2  ALBUMIN 3.9 4.3    Recent Labs  09/10/13 1450 09/11/13 2229 09/13/13 0450  WBC 6.4 10.7* 11.5*  NEUTROABS 4.1 9.6*  --   HGB 15.2 17.1* 16.3  HCT 41.7 46.9 45.1  MCV 88.9 87.7 90.4  PLT 182 192 167    Recent Labs  09/10/13 1450  TSH 0.964    Recent Labs  09/10/13 1450  INR 1.07    Radiology/Studies: X-ray Chest Pa And Lateral 09/11/2013    IMPRESSION: 1. No evidence of acute cardiopulmonary disease. 2. Mild right base atelectasis. 3. Suspect COPD.    Electronically Signed   By: Jonathan  Watts   On: 09/11/2013 06:00   Ct Head Wo Contrast 09/11/2013    IMPRESSION: No acute intracranial abnormality.  Old pontine infarct.    Original Report Authenticated By: James Maxwell, M.D.   Dg Chest Port 1 View 09/12/2013    IMPRESSION: Left lower lobe atelectasis. Possible small left pleural effusion.    Stable cardiomegaly.    Original Report Authenticated By: Courtnay Bloomer   Brain MRI: IMPRESSION: 1. Small acute lacunar infarcts in the left PCA and left SCA territory. There might also be a tiny right PICA lacunar infarct. No associated mass effect or hemorrhage. 2. Underlying chronic left pons lacune (favored) verses dilated perivascular space. 3. Generalized anterior circulation dolichoectasia. Mild for age nonspecific white matter signal changes.  Cardiac cath:  Final Conclusions:  1. No evidence of obstructive coronary artery disease in the main arteries. There is significant disease in a normal size first diagonal branch which likely does not explain the patient's symptoms.  2. Normal LV systolic function with normal left ventricular end-diastolic pressure.  3. Mild pulmonary hypertension with normal cardiac output. 4. LVEF normal - 60%   Telemetry: sinus tachycardia with first degree AV block   Assessment and Plan  1. AMS - appreciate  PCCM and Neuro assistance - suspect acute delirium from benzodiazipine use and/or acute CVA post cath - brain MRI shows small acute lacunar infarcts in left PCA and SCA 2. Sinus node dysfunction / sinus pauses - no pauses in last 24 hours - will need PPM, will keep NPO until Dr. Taylor sees 3. CAD s/p cardiac cath yesterday - 80% diagonal lesion otherwise nonobstructive CAD - chest pain this AM 4. Normal LVEF by cath (was estimated to be 40-45% by echo August 2014) 5. HTN - improved 6. Leukocytosis - low grade fever after cath but felt to be related to clonidine withdrawal - unclear etiology - x-ray negative for PNA; UA negative 7. SVT (likely atrial tachy)  Dr. Taylor to see. Please see recommendations below. Signed, EDMISTEN, BROOKE PA-C  EP Attending  Patient seen and examined. Agree with above. Will plan to proceed with PPM insertion and uptitration of his AV nodal blocking drugs. I have discussed the risks/benefits/goals/expectations of PPM insertion with the patient and he wishes to proceed.  Gregg Taylor,M.D.  

## 2013-09-13 NOTE — Progress Notes (Signed)
PULMONARY  / CRITICAL CARE MEDICINE  Name: [name] MRN: [id number] DOB: [birth year]    ADMISSION DATE:  09/10/2013 PROGRESS NOTE DATE:  09/13/2013  REFERRING MD :  Wyline Mood [facility] PRIMARY SERVICE: PCCM  CHIEF COMPLAINT:  Confusion  BRIEF PATIENT DESCRIPTION: 79 y/o male admitted on 9/16 for syncope in the setting of bradycardia developed acute confusion and hypertension after his left and right heart catheterization on 9/17. A head CT was negative and EEG showed non-specific slowing.  Notably, clonidine was held on admission for concern that it may have contributed to his bradycardia.  At home he took 0.2 mg bid with instructions to take an extra tablet if his BP was greater than 170. Clonidine patch and BZDs were d/c 9/18, replaced with Precedex d/c same day due to 6sec pause. Scheduled PPM 9/19.  SIGNIFICANT EVENTS / STUDIES:  9/16 admission 9/17 RHC/LHC > RA 3 mmHg RV 37/1 mmHg PA 33/12 mmHg PCWP 6 mmHg LV 201/4 mHg . LVEDP: 12 mmHg AO 202/94 mmHg Oxygen saturations: PA 74% AO 94% Cardiac Output (Fick) 6.72 Cardiac Index (Fick) 3.22 Pulmonary vascular resistance (PVR): 1.9 Woods units. LVEF normal; no obstructive CAD in main arteries 9/17 CT head > NAICP, old pontine infarct 9/17 EEG > non-specific slowing of cerebral activity  LINES / TUBES:   CULTURES: 9/17 blood >> 9/17 urine >> no growth 9/17 MRSA by PCR >>> negative  ANTIBIOTICS: Cefazolin 9/19, 1 dose Gentamicin 9/19, 1 dose  SUBJECTIVE:  Pt sitting up in bedside chair, wife in room. Pt is coherent and able to respond. States feels much better and is hungry. No BM since admission.  Pt scheduled for Pacemaker placement around 2PM, 9/19. Denies any chest pain, SOB, or abdominal pain.    VITAL SIGNS: Temp:  [98.1 F (36.7 C)-99 F (37.2 C)] 99 F (37.2 C) (09/19 0807) Pulse Rate:  [34-97] 97 (09/19 0400) Resp:  [14-28] 20 (09/19 0807) BP: (103-197)/(52-116) 129/67 mmHg (09/19 0935) SpO2:  [92 %-100 %] 95 %  (09/19 0807) Weight:  [94.3 kg (207 lb 14.3 oz)] 94.3 kg (207 lb 14.3 oz) (09/19 0500) HEMODYNAMICS:   VENTILATOR SETTINGS:   INTAKE / OUTPUT: Intake/Output     09/18 0701 - 09/19 0700 09/19 0701 - 09/20 0700   P.O. 300    I.V. (mL/kg) 1232.7 (13.1)    IV Piggyback     Total Intake(mL/kg) 1532.7 (16.3)    Urine (mL/kg/hr) 1050 (0.5)    Total Output 1050     Net +482.7          Urine Occurrence 3 x      PHYSICAL EXAMINATION:  Gen: pleasant, sitting in bedside chair HEENT: NCAT, PERRL, EOMi, PULM: CTA B CV: RRR, no mgr, no JVD AB: BS+, soft, nontender, no hsm Ext: warm, no clubbing, no cyanosis Derm: no rash or skin breakdown Neuro: A & O x3  LABS:  CBC Recent Labs     09/10/13  1450  09/11/13  2229  09/13/13  0450  WBC  6.4  10.7*  11.5*  HGB  15.2  17.1*  16.3  HCT  41.7  46.9  45.1  PLT  182  192  167   Coag's Recent Labs     09/10/13  1450  INR  1.07   BMET Recent Labs     09/10/13  1450  09/11/13  2229  09/13/13  0450  NA  139  139  139  K  3.7  3.2*  3.2*  CL  102  101  105  CO2  26  20  17*  BUN  21  17  21   CREATININE  0.97  0.92  0.84  GLUCOSE  103*  203*  111*   Electrolytes Recent Labs     09/10/13  1450  09/11/13  2229  09/12/13  0420  09/13/13  0450  CALCIUM  9.8  9.0   --   8.2*  MG   --    --   2.0  2.0  PHOS   --    --   2.6  1.4*   Sepsis Markers No results found for this basename: LACTICACIDVEN, PROCALCITON, O2SATVEN,  in the last 72 hours ABG Recent Labs     09/11/13  1435  09/11/13  2049  PHART  7.517*  7.569*  PCO2ART  28.7*  22.4*  PO2ART  61.0*  67.0*   Liver Enzymes Recent Labs     09/10/13  1450  09/11/13  2229  AST  17  19  ALT  11  13  ALKPHOS  58  60  BILITOT  0.7  1.3*  ALBUMIN  3.9  4.3   Cardiac Enzymes No results found for this basename: TROPONINI, PROBNP,  in the last 72 hours Glucose Recent Labs     09/12/13  0907  09/12/13  1209  09/12/13  1615  09/12/13  2035  09/13/13  0013   09/13/13  0342  GLUCAP  121*  120*  112*  142*  117*  110*    CXR: severely rotated, no clear change from prior EKG: 1st degree AVB, LAD, QTc 486  ASSESSMENT / PLAN:  NEUROLOGIC A:  Acute encephalopathy on hospital day 2 post LHC. Head CT - old pontine CVA. At this point no clear etiology identified; DD includes BDZ induced delerium, clonidine withdrawal, less likely cholesterol embolism.  Sundowning also possible.   P:   -dc precedex gtt - due to 6sec pause (9/18 PM) -d/c further haldol at this point -MRI noted. -hold further benzos, very likely the cause of delirium  PULMONARY A: No acute issues P:   -titrate o2 as needed -IS per RT protocol  CARDIOVASCULAR A: Hypertension > now presumably clonidine withdrawal Bradycardia, TAchy-brady syndrome, dc clonidine patch 9/18 CHF > not in exacerbation P:  -hold coreg due to sinus pauses -nicardipine gtt if needed or use hydralazine prn titrated to MAP 140-160 -tele -scheduled PPM, 9/19 12noon  RENAL A:  Hypokalemia Hypophosphatemia Hypocalcemia Bicarb dropping P:   -Replete K+ -repeat BMET -recommend limiting chloride in IVF to avoid acidosis (hyperchloremic acidosis likely)  GASTROINTESTINAL A:  No acute issues P:    HEMATOLOGIC A:  NO acute issues P:   INFECTIOUS A:  Low grade temp, presumably related to clonidine withdrawal Leukocytosis UA neg P:   -APAP prn -pan culture -Trend WBC  ENDOCRINE A:  DM2 Hyperglycemia P:   -SSI   TODAY'S SUMMARY: Patient is much improved, going for a pace maker today, would recommend against any further benzo use.  CVA on MRI not likely the cause of AMS.  PCCM will sign off, please call back if needed.  I have personally obtained a history, examined the patient, evaluated laboratory and imaging results, formulated the assessment and plan and placed orders.  Alyson Reedy, M.D. Pulmonary and Critical Care Medicine [facility] Pager: [phone]  09/13/2013, 10:51 AM

## 2013-09-13 NOTE — Progress Notes (Signed)
Pt received PM insertion today, due for lovenox, SCD's on.  Dr Adolm Joseph informed, advised to D/C lovenox.

## 2013-09-13 NOTE — Progress Notes (Signed)
eLink Physician-Brief Progress Note Patient Name: JAKE FUHRMANN DOB: October 04, 1933 MRN: [id number]  Date of Service  09/13/2013   HPI/Events of Note  Hypokalemia and hypophosphatemia   eICU Interventions  Potassium and Phos replaced   Intervention Category Intermediate Interventions: Electrolyte abnormality - evaluation and management  Andersyn Fragoso 09/13/2013, 6:58 AM

## 2013-09-13 NOTE — H&P (View-Only) (Signed)
Patient: QUINTERIUS GAIDA Date of Encounter: 09/13/2013, 7:25 AM Admit date: 09/10/2013     Subjective  Mr. Belloso' mental status is significantly improved. He had another episode of chest pain this AM. SBP 165, pulse 103. Relieved with SL NTG x 2. No further pauses in nearly 24 hours.   Objective  Physical Exam: Vitals: BP 141/52  Pulse 97  Temp(Src) 98.1 F (36.7 C) (Axillary)  Resp 19  Ht 6\' 1"  (1.854 m)  Wt 207 lb 14.3 oz (94.3 kg)  BMI 27.43 kg/m2  SpO2 97% General: Well developed 79 year old male in no acute distress.  Neck: Supple. JVD not elevated. Lungs: Clear bilaterally to auscultation without wheezes, rales, or rhonchi. Breathing is unlabored. Heart: RRR S1 S2 without murmurs, rubs, or gallops.  Abdomen: Soft, non-distended. Extremities: No clubbing or cyanosis. No edema. Neuro: Alert and oriented x 3. Strength intact. No focal deficits.    Intake/Output:  Intake/Output Summary (Last 24 hours) at 09/13/13 0725 Last data filed at 09/13/13 0700  Gross per 24 hour  Intake 1532.67 ml  Output   1050 ml  Net 482.67 ml    Inpatient Medications:  . aspirin  243 mg Oral Once  . aspirin EC  81 mg Oral Daily  . enalapril  20 mg Oral BID  . enoxaparin (LOVENOX) injection  40 mg Subcutaneous Q24H  . insulin aspart  0-15 Units Subcutaneous Q4H  . losartan  100 mg Oral Daily  . multivitamin with minerals  1 tablet Oral Daily  . omega-3 acid ethyl esters  1 g Oral BID  . potassium phosphate IVPB (mmol)  30 mmol Intravenous Once  . sodium chloride  3 mL Intravenous Q12H  . tamsulosin  0.4 mg Oral QPC supper   . sodium chloride 50 mL/hr at 09/13/13 0625  . niCARDipine Stopped (09/12/13 0430)    Labs:  Recent Labs  09/11/13 2229 09/12/13 0420 09/13/13 0450  NA 139  --  139  K 3.2*  --  3.2*  CL 101  --  105  CO2 20  --  17*  GLUCOSE 203*  --  111*  BUN 17  --  21  CREATININE 0.92  --  0.84  CALCIUM 9.0  --  8.2*  MG  --  2.0 2.0  PHOS  --  2.6 1.4*     Recent Labs  09/10/13 1450 09/11/13 2229  AST 17 19  ALT 11 13  ALKPHOS 58 60  BILITOT 0.7 1.3*  PROT 7.5 8.2  ALBUMIN 3.9 4.3    Recent Labs  09/10/13 1450 09/11/13 2229 09/13/13 0450  WBC 6.4 10.7* 11.5*  NEUTROABS 4.1 9.6*  --   HGB 15.2 17.1* 16.3  HCT 41.7 46.9 45.1  MCV 88.9 87.7 90.4  PLT 182 192 167    Recent Labs  09/10/13 1450  TSH 0.964    Recent Labs  09/10/13 1450  INR 1.07    Radiology/Studies: X-ray Chest Pa And Lateral 09/11/2013    IMPRESSION: 1. No evidence of acute cardiopulmonary disease. 2. Mild right base atelectasis. 3. Suspect COPD.    Electronically Signed   By: Tiburcio Pea   On: 09/11/2013 06:00   Ct Head Wo Contrast 09/11/2013    IMPRESSION: No acute intracranial abnormality.  Old pontine infarct.    Original Report Authenticated By: Francene Boyers, M.D.   Dg Chest Port 1 View 09/12/2013    IMPRESSION: Left lower lobe atelectasis. Possible small left pleural effusion.  Stable cardiomegaly.    Original Report Authenticated By: Awilda Metro   Brain MRI: IMPRESSION: 1. Small acute lacunar infarcts in the left PCA and left SCA territory. There might also be a tiny right PICA lacunar infarct. No associated mass effect or hemorrhage. 2. Underlying chronic left pons lacune (favored) verses dilated perivascular space. 3. Generalized anterior circulation dolichoectasia. Mild for age nonspecific white matter signal changes.  Cardiac cath:  Final Conclusions:  1. No evidence of obstructive coronary artery disease in the main arteries. There is significant disease in a normal size first diagonal branch which likely does not explain the patient's symptoms.  2. Normal LV systolic function with normal left ventricular end-diastolic pressure.  3. Mild pulmonary hypertension with normal cardiac output. 4. LVEF normal - 60%   Telemetry: sinus tachycardia with first degree AV block   Assessment and Plan  1. AMS - appreciate  PCCM and Neuro assistance - suspect acute delirium from benzodiazipine use and/or acute CVA post cath - brain MRI shows small acute lacunar infarcts in left PCA and SCA 2. Sinus node dysfunction / sinus pauses - no pauses in last 24 hours - will need PPM, will keep NPO until Dr. Ladona Ridgel sees 3. CAD s/p cardiac cath yesterday - 80% diagonal lesion otherwise nonobstructive CAD - chest pain this AM 4. Normal LVEF by cath (was estimated to be 40-45% by echo August 2014) 5. HTN - improved 6. Leukocytosis - low grade fever after cath but felt to be related to clonidine withdrawal - unclear etiology - x-ray negative for PNA; UA negative 7. SVT (likely atrial tachy)  Dr. Ladona Ridgel to see. Please see recommendations below. Signed, Exie Parody  EP Attending  Patient seen and examined. Agree with above. Will plan to proceed with PPM insertion and uptitration of his AV nodal blocking drugs. I have discussed the risks/benefits/goals/expectations of PPM insertion with the patient and he wishes to proceed.  Leonia Reeves.D.

## 2013-09-13 NOTE — Progress Notes (Signed)
Patient in distress and c/o chest pain 10/10 at 0610 this morning. BP at the time 164/65, oxygen via [place] administered. First dose of SL nitro administered at 0614, EKG done-no ST elevation noted, pain down to 2-3/10, BP 130/56. Second dose of Nitro SL administered at [phone]. Patient now reports only "slight" pain in his chest 1/10, BP 146/69, pt. Looks more comfortable and resting.  On call provider notified.  [name] [place]

## 2013-09-13 NOTE — Progress Notes (Signed)
Stroke Team Progress Note  HISTORY [name] is an 79 y.o. male who presented to the hospital for cardiac evaluation due to DOE. Today patient under went a cardiac catheterization which showed "No evidence of obstructive coronary artery disease in the main arteries. There is significant disease in a normal size first diagonal branch which likely does not explain the patient's symptoms." prior to cardiac catheterrization his BP was elevated between 148/88-210/100. Post cardiac catheterization patient was noted to have altered mental status, not responding to commands and bilateral shivering, in addition to tachypnea. Code stroke was called and patient was bought to CT. At present time patient is awake, repeating "oh me", follows no commands, moving all extremities equally and antigravity with bilateral shivering noted in both arms and legs. Patient continues transition between tachy-brady rhythms. Initially while in CT scanner he had a left gaze preference but currently he is moving eyes in all directions and looking toward voices.   Date last known well: Date: 09/11/2013  Time last known well: Time: 15:15  tPA Given: No: no focal deficit   He was admitted for further evaluation and treatment.  SUBJECTIVE His wife is at the bedside.  Overall he feels his condition is completely resolved.   OBJECTIVE Most recent Vital Signs: Filed Vitals:   09/13/13 1200 09/13/13 1610 09/13/13 1615 09/13/13 1617  BP: 171/114 197/103 181/98 192/90  Pulse:  80 80 78  Temp: 98.1 F (36.7 C)   99 F (37.2 C)  TempSrc: Oral   Oral  Resp: 28   20  Height:      Weight:      SpO2:  96% 95% 96%   CBG (last 3)   Recent Labs  09/13/13 0342 09/13/13 0757 09/13/13 1158  GLUCAP 110* 146* 165*    IV Fluid Intake:   . sodium chloride    . niCARDipine Stopped (09/12/13 0430)    MEDICATIONS  . aspirin  243 mg Oral Once  . aspirin EC  81 mg Oral Daily  .  ceFAZolin (ANCEF) IV  1 g Intravenous Q6H  .  enalapril  20 mg Oral BID  . enoxaparin (LOVENOX) injection  40 mg Subcutaneous Q24H  . HYDROcodone-acetaminophen  1 tablet Oral Q6H  . insulin aspart  0-15 Units Subcutaneous Q4H  . losartan  100 mg Oral Daily  . multivitamin with minerals  1 tablet Oral Daily  . omega-3 acid ethyl esters  1 g Oral BID  . potassium chloride  40 mEq Oral TID  . sodium chloride  3 mL Intravenous Q12H  . sodium chloride  3 mL Intravenous Q12H  . tamsulosin  0.4 mg Oral QPC supper   PRN:  sodium chloride, sodium chloride, acetaminophen, acetaminophen, haloperidol lactate, hydrALAZINE, nitroGLYCERIN, ondansetron (ZOFRAN) IV, ondansetron (ZOFRAN) IV, sodium chloride, sodium chloride, traMADol, zolpidem  Diet:  Cardiac thin liquids Activity:  After cath bedrest: OOB with assist DVT Prophylaxis:  SCD  CLINICALLY SIGNIFICANT STUDIES Basic Metabolic Panel:  Recent Labs Lab 09/11/13 2229 09/12/13 0420 09/13/13 0450  NA 139  --  139  K 3.2*  --  3.2*  CL 101  --  105  CO2 20  --  17*  GLUCOSE 203*  --  111*  BUN 17  --  21  CREATININE 0.92  --  0.84  CALCIUM 9.0  --  8.2*  MG  --  2.0 2.0  PHOS  --  2.6 1.4*   Liver Function Tests:  Recent Labs Lab 09/10/13 1450  09/11/13 2229  AST 17 19  ALT 11 13  ALKPHOS 58 60  BILITOT 0.7 1.3*  PROT 7.5 8.2  ALBUMIN 3.9 4.3   CBC:  Recent Labs Lab 09/10/13 1450 09/11/13 2229 09/13/13 0450  WBC 6.4 10.7* 11.5*  NEUTROABS 4.1 9.6*  --   HGB 15.2 17.1* 16.3  HCT 41.7 46.9 45.1  MCV 88.9 87.7 90.4  PLT 182 192 167   Coagulation:  Recent Labs Lab 09/10/13 1450  LABPROT 13.7  INR 1.07   Cardiac Enzymes: No results found for this basename: CKTOTAL, CKMB, CKMBINDEX, TROPONINI,  in the last 168 hours Urinalysis:  Recent Labs Lab 09/11/13 2212  COLORURINE YELLOW  LABSPEC 1.016  PHURINE 8.0  GLUCOSEU 100*  HGBUR TRACE*  BILIRUBINUR NEGATIVE  KETONESUR 15*  PROTEINUR 30*  UROBILINOGEN 1.0  NITRITE NEGATIVE  LEUKOCYTESUR NEGATIVE   Lipid  Panel No results found for this basename: chol, trig, hdl, cholhdl, vldl, ldlcalc   HgbA1C  No results found for this basename: HGBA1C    Urine Drug Screen:   No results found for this basename: labopia, cocainscrnur, labbenz, amphetmu, thcu, labbarb    Alcohol Level: No results found for this basename: ETH,  in the last 168 hours  Mr Brain Wo Contrast 09/12/2013    1. Small acute lacunar infarcts in the left PCA and left SCA territory. There might also be a tiny right PICA lacunar infarct. No associated mass effect or hemorrhage.  2. Underlying chronic left pons lacune (favored) verses dilated perivascular space.  3. Generalized anterior circulation dolichoectasia. Mild for age nonspecific white matter signal changes.   Dg Chest Port 1 View 09/12/2013   Left lower lobe atelectasis. Possible small left pleural effusion.  Stable cardiomegaly.  09/11/2013   No definite change since previous day's exam.  CT of the brain    MRA of the brain    2D Echocardiogram    Carotid Doppler    EKG  normal sinus rhythm.   Therapy Recommendations   Physical Exam   Pleasant elderly Caucasian male sitting in bedside chair and not in any distress.Awake alert. Afebrile. Head is nontraumatic. Neck is supple without bruit. Hearing is normal. Cardiac exam no murmur or gallop. Lungs are clear to auscultation. Distal pulses are well felt. Neurological Exam ; awake alert oriented x2. Diminished attention, registration and recall. No aphasia, dysarthria. Extraocular moments are full range without nystagmus. Blinks to threat bilaterally. No facial weakness. Tongue is midline. Motor system exam reveals no upper or lower extremity drift. Moves all 4 extremity well against gravity without focal weakness. Fine finger movements slightly diminished on the left. Sensation appears preserved. Gait was not tested. ASSESSMENT [name] is a 79 y.o. male presenting with acute delirium following cardiac cath. Imaging  confirms a left small lacunar infarct. Infarct felt to be embolic secondary to procedure related, not aspirin failure.  On aspirin 81 mg orally every day prior to admission. Now on aspirin 81 mg orally every day for secondary stroke prevention. Patient with resultant transient delirium now resolved. Work up underway.   Diabetes mellitius  Obstructive sleep apnea  Hypertension  Obesity  Hypokalemia  Hospital day # 3  TREATMENT/PLAN  Continue aspirin 81 mg orally every day for secondary stroke prevention.  LIPID/HGB A1C pending: patient on lovaza, updating labs  Passed swallow screen  Physical therapy evaluation  2D Echo and carotid dopplers pending  Risk factor modification  Gwendolyn Lima. Manson Passey, PAC, MBA, MHA [facility]  Pager: [phone] 09/13/2013 5:08 PM  I have personally obtained a history, examined the patient, evaluated imaging results, and formulated the assessment and plan of care. I agree with the above. Delia Heady, MD

## 2013-09-13 NOTE — Interval H&P Note (Signed)
History and Physical Interval Note:  09/13/2013 1:09 PM  [name]  has presented today for surgery, with the diagnosis of Heart block  The various methods of treatment have been discussed with the patient and family. After consideration of risks, benefits and other options for treatment, the patient has consented to  Procedure(s): PERMANENT PACEMAKER INSERTION (N/A) as a surgical intervention .  The patient's history has been reviewed, patient examined, no change in status, stable for surgery.  I have reviewed the patient's chart and labs.  Questions were answered to the patient's satisfaction.     Lewayne Bunting

## 2013-09-14 ENCOUNTER — Observation Stay (HOSPITAL_COMMUNITY): Payer: Medicare Other

## 2013-09-14 ENCOUNTER — Encounter (HOSPITAL_COMMUNITY): Payer: Self-pay | Admitting: Nurse Practitioner

## 2013-09-14 DIAGNOSIS — I6381 Other cerebral infarction due to occlusion or stenosis of small artery: Secondary | ICD-10-CM

## 2013-09-14 DIAGNOSIS — I495 Sick sinus syndrome: Secondary | ICD-10-CM

## 2013-09-14 DIAGNOSIS — I2510 Atherosclerotic heart disease of native coronary artery without angina pectoris: Secondary | ICD-10-CM

## 2013-09-14 DIAGNOSIS — I471 Supraventricular tachycardia: Secondary | ICD-10-CM

## 2013-09-14 LAB — GLUCOSE, CAPILLARY
Glucose-Capillary: 133 mg/dL — ABNORMAL HIGH (ref 70–99)
Glucose-Capillary: 119 mg/dL — ABNORMAL HIGH (ref 70–99)
Glucose-Capillary: 156 mg/dL — ABNORMAL HIGH (ref 70–99)

## 2013-09-14 LAB — LIPID PANEL
Cholesterol: 125 mg/dL (ref 0–200)
Triglycerides: 49 mg/dL (ref <150)
VLDL: 10 mg/dL (ref 0–40)

## 2013-09-14 MED ORDER — CLONIDINE HCL 0.1 MG PO TABS: 0.1000 mg | ORAL_TABLET | Freq: Two times a day (BID) | ORAL | Status: DC

## 2013-09-14 MED ORDER — POTASSIUM CHLORIDE CRYS ER 20 MEQ PO TBCR: 40.0000 meq | EXTENDED_RELEASE_TABLET | Freq: Every day | ORAL | Status: DC

## 2013-09-14 MED ORDER — NITROGLYCERIN 0.4 MG SL SUBL: 0.4000 mg | SUBLINGUAL_TABLET | SUBLINGUAL | Status: DC | PRN

## 2013-09-14 MED ORDER — METOPROLOL SUCCINATE ER 50 MG PO TB24: 50.0000 mg | ORAL_TABLET | Freq: Every day | ORAL | Status: DC

## 2013-09-14 MED ORDER — FUROSEMIDE 40 MG PO TABS: 40.0000 mg | ORAL_TABLET | Freq: Every day | ORAL | Status: DC

## 2013-09-14 NOTE — Progress Notes (Signed)
Stroke Team Progress Note  HISTORY [name] is a 79 y.o. male who presented to the hospital for cardiac evaluation due to DOE. On 09/11/2013  patient under went a cardiac catheterization which showed "No evidence of obstructive coronary artery disease in the main arteries. There was significant disease in a normal size first diagonal branch which likely did not explain the patient's symptoms." prior to cardiac catheterrization his BP was elevated between 148/88-210/100. Post cardiac catheterization patient was noted to have altered mental status, not responding to commands and bilateral shivering, in addition to tachypnea. Code stroke was called and patient was bought to CT. The patient was awake, repeating "oh me", following no commands, moving all extremities equally and antigravity with bilateral shivering noted in both arms and legs. Patient continued transition between tachy-brady rhythms. Initially while in CT scanner he had a left gaze preference but later he was moving eyes in all directions and looking toward voices.   Date last known well: Date: 09/11/2013  Time last known well: Time: 15:15  tPA Given: No: no focal deficit   He was admitted for further evaluation and treatment.  SUBJECTIVE   OBJECTIVE Most recent Vital Signs: Filed Vitals:   09/14/13 0501 09/14/13 0600 09/14/13 0752 09/14/13 0800  BP: 143/77 148/80 147/75 129/91  Pulse:   104   Temp:   98.4 F (36.9 C)   TempSrc:   Oral   Resp:   16   Height:      Weight:      SpO2:   94%    CBG (last 3)   Recent Labs  09/13/13 2150 09/14/13 0401 09/14/13 0809  GLUCAP 133* 119* 156*    IV Fluid Intake:   . sodium chloride    . niCARDipine Stopped (09/12/13 0430)    MEDICATIONS  . aspirin  243 mg Oral Once  . aspirin EC  81 mg Oral Daily  . enalapril  20 mg Oral BID  . HYDROcodone-acetaminophen  1 tablet Oral Q6H  . insulin aspart  0-15 Units Subcutaneous Q4H  . multivitamin with minerals  1 tablet Oral  Daily  . omega-3 acid ethyl esters  1 g Oral BID  . sodium chloride  3 mL Intravenous Q12H  . tamsulosin  0.4 mg Oral QPC supper   PRN:  sodium chloride, acetaminophen, acetaminophen, haloperidol lactate, hydrALAZINE, nitroGLYCERIN, ondansetron (ZOFRAN) IV, ondansetron (ZOFRAN) IV, sodium chloride, traMADol, zolpidem  Diet:  Cardiac thin liquids Activity:  After cath bedrest: OOB with assist DVT Prophylaxis:  SCD  CLINICALLY SIGNIFICANT STUDIES Basic Metabolic Panel:  Recent Labs Lab 09/11/13 2229  09/13/13 0450 09/14/13 0625  NA 139  --  139  --   K 3.2*  --  3.2*  --   CL 101  --  105  --   CO2 20  --  17*  --   GLUCOSE 203*  --  111*  --   BUN 17  --  21  --   CREATININE 0.92  --  0.84  --   CALCIUM 9.0  --  8.2*  --   MG  --   < > 2.0 2.1  PHOS  --   < > 1.4* 2.4  < > = values in this interval not displayed. Liver Function Tests:   Recent Labs Lab 09/10/13 1450 09/11/13 2229  AST 17 19  ALT 11 13  ALKPHOS 58 60  BILITOT 0.7 1.3*  PROT 7.5 8.2  ALBUMIN 3.9 4.3   CBC:  Recent Labs Lab 09/10/13 1450 09/11/13 2229 09/13/13 0450  WBC 6.4 10.7* 11.5*  NEUTROABS 4.1 9.6*  --   HGB 15.2 17.1* 16.3  HCT 41.7 46.9 45.1  MCV 88.9 87.7 90.4  PLT 182 192 167   Coagulation:   Recent Labs Lab 09/10/13 1450  LABPROT 13.7  INR 1.07   Cardiac Enzymes: No results found for this basename: CKTOTAL, CKMB, CKMBINDEX, TROPONINI,  in the last 168 hours Urinalysis:   Recent Labs Lab 09/11/13 2212  COLORURINE YELLOW  LABSPEC 1.016  PHURINE 8.0  GLUCOSEU 100*  HGBUR TRACE*  BILIRUBINUR NEGATIVE  KETONESUR 15*  PROTEINUR 30*  UROBILINOGEN 1.0  NITRITE NEGATIVE  LEUKOCYTESUR NEGATIVE   Lipid Panel    Component Value Date/Time   CHOL 125 09/14/2013 0625   HgbA1C  No results found for this basename: HGBA1C    Urine Drug Screen:   No results found for this basename: labopia,  cocainscrnur,  labbenz,  amphetmu,  thcu,  labbarb    Alcohol Level: No  results found for this basename: ETH,  in the last 168 hours  Lipid panel  09/14/2013 Cholesterol 125 Triglycerides 14 HDL 61 LDL 54  Mr Brain Wo Contrast 09/12/2013    1. Small acute lacunar infarcts in the left PCA and left SCA territory. There might also be a tiny right PICA lacunar infarct. No associated mass effect or hemorrhage.  2. Underlying chronic left pons lacune (favored) verses dilated perivascular space.  3. Generalized anterior circulation dolichoectasia. Mild for age nonspecific white matter signal changes.   Dg Chest Port 1 View 09/12/2013   Left lower lobe atelectasis. Possible small left pleural effusion.  Stable cardiomegaly.  09/11/2013   No definite change since previous day's exam.  CT of the brain   09/11/2013 No acute intracranial abnormality. Old pontine infarct.   MRA of the brain    2D Echocardiogram  pending  Carotid Doppler  pending  EKG  normal sinus rhythm.   Therapy Recommendations pending  Physical Exam   Pleasant elderly Caucasian male sitting in bedside chair and not in any distress.Awake alert. Afebrile. Head is nontraumatic. Neck is supple without bruit. Hearing is normal. Cardiac exam no murmur or gallop. Lungs are clear to auscultation. Distal pulses are well felt. Neurological Exam ; awake alert oriented x2. Diminished attention, registration and recall. No aphasia, dysarthria. Extraocular moments are full range without nystagmus. Blinks to threat bilaterally. No facial weakness. Tongue is midline. Motor system exam reveals no upper or lower extremity drift. Moves all 4 extremity well against gravity without focal weakness. Fine finger movements slightly diminished on the left. Sensation appears preserved. Gait was not tested.  ASSESSMENT [name] is a 79 y.o. male presenting with acute delirium following cardiac cath. Imaging confirms a left small lacunar infarct. Infarct felt to be embolic secondary to procedure related, not  aspirin failure.  On aspirin 81 mg orally every day prior to admission. Now on aspirin 81 mg orally every day for secondary stroke prevention. Patient with resultant transient delirium now resolved. Work up underway.   Diabetes mellitius  Obstructive sleep apnea  Hypertension  Obesity  Hypokalemia  Hospital day # 4  TREATMENT/PLAN  Continue aspirin 81 mg orally every day for secondary stroke prevention.  HGB A1C pending  Passed swallow screen  Physical therapy evaluation pending  2D Echo and carotid dopplers pending  Risk factor modification  Delton See PA-C [facility] Pager [phone] 09/14/2013, 10:06 AM  I  have personally obtained a history, examined the patient, evaluated imaging results, and formulated the assessment and plan of care. I agree with the above.  Elspeth Cho, DO Neurology-Stroke

## 2013-09-14 NOTE — Progress Notes (Signed)
Patient ID: [name], male   DOB: [birth year], 79 y.o.   MRN: [id number] Subjective:  S/p PPM insertion for tachy-brady syndrome, remains hypertensive overnight.   Objective:  Vital Signs in the last 24 hours: Temp:  [98.1 F (36.7 C)-99.3 F (37.4 C)] 98.4 F (36.9 C) (09/20 0752) Pulse Rate:  [78-104] 104 (09/20 0752) Resp:  [14-28] 16 (09/20 0752) BP: (129-204)/(47-115) 147/75 mmHg (09/20 0752) SpO2:  [93 %-96 %] 94 % (09/20 0752) Weight:  [190 lb 0.6 oz (86.2 kg)] 190 lb 0.6 oz (86.2 kg) (09/20 0004)  Intake/Output from previous day: 09/19 0701 - 09/20 0700 In: 600 [P.O.:580; I.V.:20] Out: 401 [Urine:401] Intake/Output from this shift:    Physical Exam: Well appearing NAD HEENT: Unremarkable Neck:  No JVD, no thyromegally Back:  No CVA tenderness Lungs:  Clear with no wheezes. PM incision without hematoma. HEART:  Regular rate rhythm, no murmurs, no rubs, no clicks Abd:  Flat, positive bowel sounds, no organomegally, no rebound, no guarding Ext:  2 plus pulses, no edema, no cyanosis, no clubbing Skin:  No rashes no nodules Neuro:  CN II through XII intact, motor grossly intact  Lab Results:  Recent Labs  09/11/13 2229 09/13/13 0450  WBC 10.7* 11.5*  HGB 17.1* 16.3  PLT 192 167    Recent Labs  09/11/13 2229 09/13/13 0450  NA 139 139  K 3.2* 3.2*  CL 101 105  CO2 20 17*  GLUCOSE 203* 111*  BUN 17 21  CREATININE 0.92 0.84   No results found for this basename: TROPONINI, CK, MB,  in the last 72 hours Hepatic Function Panel  Recent Labs  09/11/13 2229  PROT 8.2  ALBUMIN 4.3  AST 19  ALT 13  ALKPHOS 60  BILITOT 1.3*   No results found for this basename: CHOL,  in the last 72 hours No results found for this basename: PROTIME,  in the last 72 hours  Imaging: Mr Brain Wo Contrast  09/12/2013   CLINICAL DATA:  79 year old male with altered mental status, confusion and agitation. Bradycardia, sinus arrest. Dizziness. Syncope.  EXAM: MRI HEAD  WITHOUT CONTRAST  TECHNIQUE: Multiplanar, multisequence MR imaging was performed. No intravenous contrast was administered.  COMPARISON:  Head CTs 09/11/2013 and earlier.  FINDINGS: Small subcentimeter foci of restricted diffusion in the left occipital lobe and left superior cerebellum (series 4, images 12 and 15). Possible tiny focus of restricted diffusion in the right cerebellar tonsil.  Heterogeneous diffusion in the brainstem. There is a sizable chronic lacunar infarct versus dilated perivascular space in the pons re- identified. No definite brainstem infarct. No supratentorial restricted diffusion identified.  Major intracranial vascular flow voids are preserved. There is diffuse anterior circulation arterial dolichoectasia.  No midline shift, mass effect, evidence of mass lesion, ventriculomegaly, extra-axial collection or acute intracranial hemorrhage. Cervicomedullary junction and pituitary are within normal limits. Negative visualized cervical spine.  Periventricular and mild scattered subcortical cerebral white matter T2 and FLAIR hyperintensity.  Sequelae of left mastoidectomy. Grossly normal visualized internal auditory structures otherwise. Normal bone marrow signal. Postoperative changes to the globes. Mild paranasal sinus mucosal thickening. Negative scalp soft tissues.  IMPRESSION: 1. Small acute lacunar infarcts in the left PCA and left SCA territory. There might also be a tiny right PICA lacunar infarct. No associated mass effect or hemorrhage.  2. Underlying chronic left pons lacune (favored) verses dilated perivascular space.  3. Generalized anterior circulation dolichoectasia. Mild for age nonspecific white matter signal changes.   Electronically Signed  By: Augusto Gamble M.D.   On: 09/12/2013 19:05    Cardiac Studies: Tele - nsr/sinus tachy/Parox AT cxr - reviewed  Assessment/Plan:  1. Symptomatic tachy-brady due to sinus node dysfunction 2. HTN, difficult to control 3. Remote  stroke 4. Non-obstructive CAD Rec: Ok for discharge, he will need a new prescription for toprol xl 50 mg daily, which will be uptitrated. He will contiue ACE inhibitor but not Losartan, and I would like him discharged on lasix 40 mg daily. I would like him to followup with labs in a week along with an incision check. He will need to see Dr. Wyline Mood in 1-2 weeks. I will see back in 3 months (>91 days).  LOS: 4 days    [name],M.D 09/14/2013, 7:55 AM

## 2013-09-14 NOTE — Progress Notes (Signed)
Tele showed 2 episodes of sudden onset tachycardia, rate 140's, regular but difficult to determine due to wavy baseline, slowed to rhythm with rate of 90's.  Pt increasingly restless all night, having difficulty monitoring BP due to pt pulling off cuff, SCD's, and sling.  Noncompliant with left arm restrictions despite putting swathe on sling.  Pt still manages to pull arm out of sling.  Has pulled condom cath off twice.  1 mg IV Haldol given.  BP 204/93, 20 mgIV hydralazine given.  BP down to 143/77.  Up to chair w/ RN at side.  Pleasantly confused.

## 2013-09-14 NOTE — Discharge Summary (Signed)
Patient ID: [name],  MRN: [id number], DOB/AGE: [id number] 77 y.o.  Admit date: 09/10/2013 Discharge date: 09/14/2013  Primary Care Provider: VYAS,DHRUV B. Primary Cardiologist: Dominga Ferry, MD / G. Ladona Ridgel, MD (EP)  Discharge Diagnoses Principal Problem:   Sinus node dysfunction  **s/p St. Jude Dual Chamber PPM this admission. Active Problems:   Multiple acute lacunar infarcts  **following diagnostic catheterization this admission.   CAD  **s/p catheterization this admission revealing severe first diagonal disease (medically managed).   DM   HYPERTENSION   Altered mental status/sundowning   ANEMIA, IRON DEFICIENCY, UNSPEC.   PSVT  **noted during catheterization this admission.  Allergies No Known Allergies  Procedures  Cardiac Catheterization 9.17.2017  Procedural Findings: Hemodynamics RA 3 mmHg RV 37/1 mmHg PA 33/12 mmHg PCWP 6 mmHg LV  201/4 mHg . LVEDP:  12 mmHg AO  202/94 mmHg  Oxygen saturations: PA  74% AO  94%  Cardiac Output (Fick)  6.72  Cardiac Index (Fick)  3.22        Pulmonary vascular resistance (PVR):  1.9 Woods units.  Coronary angiography: Coronary dominance:  right     Left Main:   Normal in size with no significant disease.  Left Anterior Descending (LAD):   Large in size with mild diffuse atherosclerosis. There is diffuse 20% disease in the midsegment.  1st diagonal (D1):   Normal in size with 80% ostial stenosis followed by a 95% proximal stenosis.  2nd diagonal (D2):   Medium in size with minor irregularities.  3rd diagonal (D3):   Normal in size with no significant disease.  Circumflex (LCx):   Normal in size and nondominant. There is 20% mid stenosis.  1st obtuse marginal:   Medium in size with minor irregularities.  2nd obtuse marginal:   Large is in size with no significant disease.  3rd obtuse marginal:   Normal in size with minor irregularities.     Right Coronary Artery:  large in size and dominant. There is 20%  stenosis distally.  Posterior descending artery:  normal in size with no significant disease.  Posterior AV segment:  normal in size with no significant disease.  Posterolateral branchs:   PL 1 is large with no significant disease. PL 2  is small.  Left ventriculography: Left ventricular systolic function is  normal , LVEF is estimated at  60 %, there is  no significant mitral regurgitation   Final Conclusions:   1. No evidence of obstructive coronary artery disease in the main arteries. There is significant disease in a normal size first diagonal branch which likely does not explain the patient's symptoms. 2. Normal LV systolic function with normal left ventricular end-diastolic pressure. 3. Mild pulmonary hypertension with normal cardiac output. _____________   CT of the Head 9.17.2014  IMPRESSION: No acute intracranial abnormality.  Old pontine infarct _____________   EEG 9.17.2014  Interpretation: EEG is abnormal with moderate continuous nonspecific slowing of cerebral activity. No evidence of epileptic activity was recorded. _____________   MRI of the Brain 9.18.2014  IMPRESSION: 1. Small acute lacunar infarcts in the left PCA and left SCA territory. There might also be a tiny right PICA lacunar infarct. No associated mass effect or hemorrhage. 2. Underlying chronic left pons lacune (favored) verses dilated perivascular space. 3. Generalized anterior circulation dolichoectasia. Mild for age nonspecific white matter signal changes. _____________   History of Present Illness  79 year old male with the above complex problem list. He has a long history of difficult to control hypertension.  He was recently seen in cardiology clinic secondary to complaints of lightheadedness and dizziness with several episodes of syncope. Patient's heart rate is been running in the 50s and his blood pressure cuff at home. His EKG that day, showed a junctional escape rhythm with rates in the  40s. A second EKG showed sinus bradycardia at a rate of 50 with first degree AV block and a 3.6 second sinus pause. Decision was made to admit him to [facility] for further evaluation and electrophysiology consultation.  Hospital Course  Following admission, patient remained bradycardic with intermittent pauses. He was seen by electrophysiology and reported intermittent chest discomfort occurring at rest. As result, decision was made to pursue diagnostic catheterization prior to pursuing pacemaker placement. Diagnostic catheterization was performed on September 17, revealing severe first diagonal branch disease but otherwise nonobstructive CAD, relatively normal right heart pressures, and normal LV function. Patient was noted to have intermittent paroxysmal supraventricular tachycardia while on the table but this broke spontaneously. It was not felt that his diagonal branch disease with the culprit for his symptoms of bradycardia and lightheadedness.  Post catheterization, patient developed nausea and vomiting associated with shaking. He was found by nursing staff to be unable to follow commands. His blood pressure was elevated in the 200 range. Patient was evaluated and there was concern for stroke. Neurology was consult and recommended stat noncontrast head CT as well as MRI and EEG. Head CT showed no acute infarct while EEG was abnormal with moderate continuous nonspecific slowing of cerebral activity. Patient was transferred to the coronary intensive care unit where he stabilized and became more responsive. He did have some agitation and sundowning overnight. Followup MRI of the brain showed small acute lacunar infarcts in the left PCA and left SCA territory as well as a possible tiny right PICA lacunar infarct. There was no associated mass effect or hemorrhage. Neurology recommended continuation of aspirin therapy.  As the patient recovered, it was felt safe to pursue permanent pacemaker placement. This  was carried out on September 19 with successful placement of a St. Jude medical dual-chamber permanent pacemaker. Patient tolerated procedure well and post procedure has been ambulating without recurrent symptoms or limitations. We plan to discharge him home today in good condition. We will arrange for followup within the next 1-2 weeks for wound check as well as general cardiology followup and basic metabolic panel.   Discharge Vitals Blood pressure 129/91, pulse 104, temperature 98.4 F (36.9 C), temperature source Oral, resp. rate 16, height 6\' 4"  (1.93 m), weight 190 lb 0.6 oz (86.2 kg), SpO2 94.00%.  Filed Weights   09/12/13 0408 09/13/13 0500 09/14/13 0004  Weight: 201 lb 1 oz (91.2 kg) 207 lb 14.3 oz (94.3 kg) 190 lb 0.6 oz (86.2 kg)   Labs  CBC  Recent Labs  09/11/13 2229 09/13/13 0450  WBC 10.7* 11.5*  NEUTROABS 9.6*  --   HGB 17.1* 16.3  HCT 46.9 45.1  MCV 87.7 90.4  PLT 192 167   Basic Metabolic Panel  Recent Labs  09/11/13 2229  09/13/13 0450 09/14/13 0625  NA 139  --  139  --   K 3.2*  --  3.2*  --   CL 101  --  105  --   CO2 20  --  17*  --   GLUCOSE 203*  --  111*  --   BUN 17  --  21  --   CREATININE 0.92  --  0.84  --  CALCIUM 9.0  --  8.2*  --   MG  --   < > 2.0 2.1  PHOS  --   < > 1.4* 2.4  < > = values in this interval not displayed. Liver Function Tests  Recent Labs  09/11/13 2229  AST 19  ALT 13  ALKPHOS 60  BILITOT 1.3*  PROT 8.2  ALBUMIN 4.3    Fasting Lipid Panel  Recent Labs  09/14/13 0625  CHOL 125  HDL 61  LDLCALC 54  TRIG 49  CHOLHDL 2.0   Disposition  Pt is being discharged home today in good condition.  Follow-up Plans & Appointments      Follow-up Information   Follow up with Lewayne Bunting, MD In 3 months. (we will arrange.)    Specialty:  Cardiology   Contact information:   [address] [place] [address] [phone]       Follow up with Dina Rich, MD. (1-2 wks, we will  arrange.)    Contact information:   [address] [place] [address] [phone]      Follow up with [facility]. (7-10 days, we will arrange.)    Contact information:   [address] [place] [address] [phone]      Discharge Medications    Medication List    STOP taking these medications       losartan 100 MG tablet  Commonly known as:  COZAAR     metoprolol 50 MG tablet  Commonly known as:  LOPRESSOR     potassium chloride 10 MEQ CR tablet  Commonly known as:  KLOR-CON      TAKE these medications       acetaminophen 500 MG tablet  Commonly known as:  TYLENOL  Take 500 mg by mouth every 6 (six) hours as needed for pain.     alendronate 70 MG tablet  Commonly known as:  FOSAMAX  Take 70 mg by mouth every 7 (seven) days. Take with a full glass of water on an empty stomach. On Mondays     aspirin 81 MG tablet  Take 81 mg by mouth daily.     CALCIUM 600 + D PO  Take 600 mg by mouth 2 (two) times daily.     cloNIDine 0.1 MG tablet  Commonly known as:  CATAPRES  Take 1 tablet (0.1 mg total) by mouth 2 (two) times daily.     enalapril 20 MG tablet  Commonly known as:  VASOTEC  Take 20 mg by mouth 2 (two) times daily.     fish oil-omega-3 fatty acids 1000 MG capsule  Take 1 g by mouth 2 (two) times daily.     furosemide 40 MG tablet  Commonly known as:  LASIX  Take 1 tablet (40 mg total) by mouth daily.     metoprolol succinate 50 MG 24 hr tablet  Commonly known as:  TOPROL XL  Take 1 tablet (50 mg total) by mouth daily. Take with or immediately following a meal.     multivitamin with minerals Tabs tablet  Take 1 tablet by mouth daily.     nitroGLYCERIN 0.4 MG SL tablet  Commonly known as:  NITROSTAT  Place 1 tablet (0.4 mg total) under the tongue every 5 (five) minutes x 3 doses as needed for chest pain.     NONFORMULARY OR COMPOUNDED ITEM  Apply 1 application topically 2 (two) times daily. Diclo 3%  /  baclo 2% / cyclo 2% / tetracaine 2% compounded cream     potassium chloride SA 20 MEQ tablet  Commonly known as:  K-DUR,KLOR-CON  Take 2 tablets (40 mEq total) by mouth daily.     tamsulosin 0.4 MG Caps capsule  Commonly known as:  FLOMAX  Take 0.4 mg by mouth 2 (two) times daily.     traMADol 50 MG tablet  Commonly known as:  ULTRAM  Take 50 mg by mouth at bedtime.      Outstanding Labs/Studies  F/u BMET at follow-up.  Duration of Discharge Encounter   Greater than 30 minutes including physician time.  Signed, Nicolasa Ducking NP 09/14/2013, 9:31 AM

## 2013-09-16 NOTE — Op Note (Signed)
[name], [name] NO.:  [id number]  MEDICAL RECORD NO.:  [id number]  LOCATION:  PERIO                        FACILITY:  [facility]  PHYSICIAN:  Doylene Canning. Ladona Ridgel, MD    DATE OF BIRTH:  08/27/1933  DATE OF PROCEDURE:  09/13/2013 DATE OF DISCHARGE:                              OPERATIVE REPORT   PROCEDURE PERFORMED:  Dual-chamber pacemaker insertion.  INDICATIONS:  Symptomatic brady-tachy syndrome with sinus node dysfunction.  INTRODUCTION:  The patient is a very pleasant 79 year old man who has had documented SVT at rates of 140 beats per minute, as well as documented bradycardia with pauses of over 4 seconds during the day while awake as well as sinus bradycardia with heart rates in the 30s. He is now referred for insertion of a dual-chamber pacemaker.  PROCEDURE IN DETAIL:  After informed consent was obtained, the patient was taken to the diagnostic EP lab in a fasting state.  After usual preparation and draping, intravenous fentanyl and midazolam was given for sedation.  A 30 mL of lidocaine was infiltrated into the left infraclavicular region.  A 5-cm incision was carried out over this region.  Electrocautery was utilized to dissect down to the fascial plane.  The cephalic vein was dissected free and cannulated. Unfortunately, the guidewire could not be advanced into the central circulation.  At this point, the subclavian vein was punctured and the St. Jude model 2088T, 58-cm active fixation pacing lead, serial number [id number] was advanced to the right ventricle and the St. Jude model 2088T 52 cm active fixation pacing lead serial #[id number] was advanced into the right atrium.  Mapping was first carried out in the right ventricle.  At the final site, the R-waves were 13 and the pacing impedance with the lead actively fixed was 800 ohms.  The threshold was less than 1 V at 0.5 milliseconds.  A 10 V pacing not stimulate the diaphragm, and there was a large  injury current with active fixation of the lead.  With the right ventricular lead in satisfactory position, attention was then turned to placement of the atrial lead, which was placed in the anterolateral portion of the right atrium where P-waves measured 3 mV, and the pacing impedance with lead actively fixed was 450 ohms.  Threshold 0.6 V at 0.5 milliseconds and again 10 V pacing did not stimulate the diaphragm.  With both the atrial and the RV lead in satisfactory position, they were secured to the subpectoral fascia in a figure-of-eight silk suture.  The sewing sleeve was secured with silk suture.  Electrocautery was utilized to make a subcutaneous pocket. Antibiotic irrigation was utilized to irrigate the pocket. Electrocautery was utilized to assure hemostasis.  The St. Jude dual- chamber pacemaker serial [id number] was connected to the atrial and RV leads and placed back in the subcutaneous pocket.  The pocket was irrigated with antibiotic irrigation and the generator secured with silk suture.  The incision was closed with 2-0 and 3-0 Vicryl.  Benzoin and Steri-Strips were painted on the skin.  A pressure dressing was applied. The patient was returned to his room in satisfactory condition.  COMPLICATIONS:  There were no immediate procedure complications.  RESULTS:  This demonstrate successful implantation of a St. Jude dual- chamber pacemaker in a patient with symptomatic sinus node dysfunction and tachy-brady syndrome.     Doylene Canning. Ladona Ridgel, MD     GWT/MEDQ  D:  09/13/2013  T:  09/14/2013  Job:  [id number]  cc:   Dina Rich, MD

## 2013-09-18 ENCOUNTER — Ambulatory Visit: Payer: Medicare Other | Admitting: Cardiology

## 2013-09-18 LAB — CULTURE, BLOOD (ROUTINE X 2): Culture: NO GROWTH

## 2013-09-25 ENCOUNTER — Ambulatory Visit (INDEPENDENT_AMBULATORY_CARE_PROVIDER_SITE_OTHER): Payer: Medicare Other | Admitting: *Deleted

## 2013-09-25 DIAGNOSIS — I495 Sick sinus syndrome: Secondary | ICD-10-CM

## 2013-09-25 LAB — PACEMAKER DEVICE OBSERVATION
AL AMPLITUDE: 3.9 mv
AL IMPEDENCE PM: 400 Ohm
AL THRESHOLD: 0.75 V
ATRIAL PACING PM: 10
BAMS-0001: 130 {beats}/min
BAMS-0003: 60 {beats}/min
BATTERY VOLTAGE: 3.0682 V
DEVICE MODEL PM: 7536055
RV LEAD AMPLITUDE: 8.6 mv
RV LEAD IMPEDENCE PM: 487.5 Ohm
RV LEAD THRESHOLD: 0.5 V
VENTRICULAR PACING PM: 25

## 2013-09-25 NOTE — Progress Notes (Signed)
Wound check appointment. Steri-strips removed. Wound without redness or edema. Incision edges approximated, wound well healed. Normal device function. Thresholds, sensing, and impedances consistent with implant measurements. Device programmed at 3.5V/auto capture programmed on for extra safety margin until 3 month visit. Histogram distribution appropriate for patient and level of activity. 25 mode switches, <1% that were atrial tachy or PAC's, 4-8 seconds.  1 high ventricular rate noted 8 seconds. Patient educated about wound care, arm mobility, lifting restrictions. ROV in 3 months with implanting physician.

## 2013-10-03 ENCOUNTER — Ambulatory Visit (INDEPENDENT_AMBULATORY_CARE_PROVIDER_SITE_OTHER): Payer: Medicare Other | Admitting: Cardiology

## 2013-10-03 ENCOUNTER — Encounter: Payer: Self-pay | Admitting: Cardiology

## 2013-10-03 VITALS — BP 202/104 | HR 61 | Ht 73.0 in | Wt 193.8 lb

## 2013-10-03 DIAGNOSIS — I498 Other specified cardiac arrhythmias: Secondary | ICD-10-CM

## 2013-10-03 DIAGNOSIS — R001 Bradycardia, unspecified: Secondary | ICD-10-CM

## 2013-10-03 NOTE — Patient Instructions (Addendum)
Your physician recommends that you schedule a follow-up appointment in:ONE MONTH  Your physician has recommended you make the following change in your medication:   1) START TAKING PRAVASTATIN 20MG  ONCE DAILY 2) START TAKING NORVASC 10MG  ONCE DAILY

## 2013-10-03 NOTE — Progress Notes (Signed)
Clinical Summary [name] is a 79 y.o.male 1. Mild systolic dysfunction  - echo 07/2012 w/ LVEF 40-45% - Cath 08/2013 w/ minimal CAD, LV gram 60% with normal filling pressures on RHC.   - reports low functional capacity due to back pain, can walk only a few feet before having to stop b/c of pain. No orthopnea, no PND - denies any chest pain  - compliant w/ medications   2. HTN  - checks bp regularly. Typically in AM 200s/100s. Repeats bp before lunch 140-160/80s. - compliant w/ meds, off clonidine because some concern of withdrawal during last hospital admission.     3. Bradycardia:  -s/p St Jude dual chamber pacemaker placement 08/2013 - no incision site pain, healed well. No light headedness or dizziness. Followed in device clinic with normal recent check.    4. CVA - AMS following cath in 08/2013, MRI showed multiple lacunar infarcts possibly secondary to cath with cholesterol emboli. Unclear if clonidine withdrawal had anything to do with symptoms. AMS resolved, patient back to baseline.   5. Hypokalemia - noted at hospital discharge, started on supplementation at discharge. Needs repeat BMP, states has this scheduled with his pcp today  6. Cholesterol - 09/14/13: TC 125 HDL 61 LDL 54 TG 49 - has not been on statin because of normal levels, newly diagnosed CAD in 08/2013  Past Medical History  Diagnosis Date  . Allergic rhinitis   . Obesity   . Essential hypertension, benign   . Diaphragmatic hernia without mention of obstruction or gangrene   . Anxiety   . Anemia   . Elevated liver function tests   . Diastolic congestive heart failure   . OSA on CPAP     "wears mask part of the time" (09/10/2013)  . Diabetes mellitus     "diet controlled" (09/10/2013)  . Arthritis     "all over my body" (09/10/2013)  . Osteoporosis   . Chronic lower back pain   . Depression   . BPH (benign prostatic hypertrophy)   . Sinus node dysfunction     a. 08/2013 s/p SJM DC PPM.  . CAD  (coronary artery disease)     a. 08/2013 LM nl, LAD 6m, D1 80ost, 95p, LCX 19m, RCA 20d, EF 60%.  . Lacunar infarction     a. 08/2013 post-cath, MRI: sm acute lacunar infarcts in LPCA and LSCA, ? tiny RPICA lacunar infarct.     No Known Allergies   Current Outpatient Prescriptions  Medication Sig Dispense Refill  . acetaminophen (TYLENOL) 500 MG tablet Take 500 mg by mouth every 6 (six) hours as needed for pain.      Marland Kitchen alendronate (FOSAMAX) 70 MG tablet Take 70 mg by mouth every 7 (seven) days. Take with a full glass of water on an empty stomach. On Mondays      . aspirin 81 MG tablet Take 81 mg by mouth daily.        . Calcium Carbonate-Vitamin D (CALCIUM 600 + D PO) Take 600 mg by mouth 2 (two) times daily.      . enalapril (VASOTEC) 20 MG tablet Take 20 mg by mouth 2 (two) times daily.        . fish oil-omega-3 fatty acids 1000 MG capsule Take 1 g by mouth 2 (two) times daily.        . furosemide (LASIX) 40 MG tablet Take 1 tablet (40 mg total) by mouth daily.  30 tablet  6  .  metoprolol succinate (TOPROL XL) 50 MG 24 hr tablet Take 1 tablet (50 mg total) by mouth daily. Take with or immediately following a meal.  30 tablet  6  . Multiple Vitamin (MULTIVITAMIN WITH MINERALS) TABS tablet Take 1 tablet by mouth daily.      . nitroGLYCERIN (NITROSTAT) 0.4 MG SL tablet Place 1 tablet (0.4 mg total) under the tongue every 5 (five) minutes x 3 doses as needed for chest pain.  25 tablet  3  . NONFORMULARY OR COMPOUNDED ITEM Apply 1 application topically 2 (two) times daily. Diclo 3% / baclo 2% / cyclo 2% / tetracaine 2% compounded cream      . potassium chloride SA (K-DUR,KLOR-CON) 20 MEQ tablet Take 2 tablets (40 mEq total) by mouth daily.  30 tablet  6  . tamsulosin (FLOMAX) 0.4 MG CAPS capsule Take 0.4 mg by mouth 2 (two) times daily.       . traMADol (ULTRAM) 50 MG tablet Take 50 mg by mouth at bedtime.       No current facility-administered medications for this visit.     Past Surgical  History  Procedure Laterality Date  . Cardiac catheterization  1998  . Cataract extraction w/ intraocular lens  implant, bilateral  2010  . Stomach surgery  03/02/2012    ?volvus; "stomach came loose and twisted; went up under rib cage; had that corrected; tacked it up" (09/10/2013)     No Known Allergies    Family History  Problem Relation Age of Onset  . Hypertension Mother   . Hypertension Father      Social History [name] reports that he has never smoked. He has never used smokeless tobacco. [name] reports that he does not drink alcohol.   Review of Systems CONSTITUTIONAL: No weight loss, fever, chills, weakness or fatigue.  HEENT: Eyes: No visual loss, blurred vision, double vision or yellow sclerae.No hearing loss, sneezing, congestion, runny nose or sore throat.  SKIN: No rash or itching.  CARDIOVASCULAR: per HPI RESPIRATORY: No per HPI GASTROINTESTINAL: No anorexia, nausea, vomiting or diarrhea. No abdominal pain or blood.  GENITOURINARY: No burning on urination, no polyuria NEUROLOGICAL: No headache, dizziness, syncope, paralysis, ataxia, numbness or tingling in the extremities. No change in bowel or bladder control.  MUSCULOSKELETAL: chronic back pain.  PSYCHIATRIC: No history of depression or anxiety.   .   Physical Examination p 61 bp 202/104 Wt 193 lbs BMI 25 Gen: resting comfortably, no acute distress HEENT: no scleral icterus, pupils equal round and reactive, no palptable cervical adenopathy,  CV: RRR, no m/r/g, no JVD, no carotid bruits Resp: Clear to auscultation bilaterally GI: abdomen is soft, non-tender, non-distended, normal bowel sounds, no hepatosplenomegaly MSK: extremities are warm, no edema.  Skin: warm, no rash Neuro:  no focal deficits Psych: appropriate affect   Diagnostic Studies Cardiac Catheterization 9.17.2017  Procedural Findings:  Hemodynamics  RA 3 mmHg  RV 37/1 mmHg  PA 33/12 mmHg  PCWP 6 mmHg  LV 201/4 mHg . LVEDP:  12 mmHg  AO 202/94 mmHg  Oxygen saturations:  PA 74%  AO 94%  Cardiac Output (Fick) 6.72  Cardiac Index (Fick) 3.22  Pulmonary vascular resistance (PVR): 1.9 Woods units.  Coronary angiography:  Coronary dominance: right  Left Main: Normal in size with no significant disease.  Left Anterior Descending (LAD): Large in size with mild diffuse atherosclerosis. There is diffuse 20% disease in the midsegment.  1st diagonal (D1): Normal in size with 80% ostial stenosis followed  by a 95% proximal stenosis.  2nd diagonal (D2): Medium in size with minor irregularities.  3rd diagonal (D3): Normal in size with no significant disease.  Circumflex (LCx): Normal in size and nondominant. There is 20% mid stenosis.  1st obtuse marginal: Medium in size with minor irregularities.  2nd obtuse marginal: Large is in size with no significant disease.  3rd obtuse marginal: Normal in size with minor irregularities.  Right Coronary Artery: large in size and dominant. There is 20% stenosis distally.  Posterior descending artery: normal in size with no significant disease.  Posterior AV segment: normal in size with no significant disease.  Posterolateral branchs: PL 1 is large with no significant disease. PL 2 is small. Left ventriculography: Left ventricular systolic function is normal , LVEF is estimated at 60 %, there is no significant mitral regurgitation    07/2012 Echo: mild LVH, LVEF 40-45%, LAE, mild MR,     Assessment and Plan  1. Systolic dysfunction - appears to have normalized based on LV gram from cath, echo from last years showed mildly decreased function. - appears euvolemic today, continue current medical therapy  2. CAD - patent major arteries with evidence of disease in diags and OMs from recent cath - continue risk factor modfications, started on low dose statin in the setting of known CAD  3. HTN - elevated blood pressures, will start on norvasc 10mg  daily.  - patient to keep bp log,  will bring next visit - possible severe withdrawal to clonidine during last admit, will avoid this medication from here on out  4. Bradycardia -symptoms of dizziness and presyncope resolved with pacemaker placement - patient just arranged for home monitoring , he will continue routine follow up.   5. CVA - evidence on MRI of lacunar infarcts during recent hospital admit, possibly related to cath - on ASA, statin for secondary prevention.  6. Cholesterol - numbers are at goal, starting low dose statin in setting of known CAD and prior CVA.      Antoine Poche, M.D., F.A.C.C.

## 2013-10-04 ENCOUNTER — Telehealth: Payer: Self-pay | Admitting: Cardiology

## 2013-10-04 MED ORDER — AMLODIPINE BESYLATE 10 MG PO TABS: 10.0000 mg | ORAL_TABLET | Freq: Every day | ORAL | Status: DC

## 2013-10-04 MED ORDER — PRAVASTATIN SODIUM 20 MG PO TABS: 20.0000 mg | ORAL_TABLET | Freq: Every evening | ORAL | Status: DC

## 2013-10-04 NOTE — Telephone Encounter (Signed)
rx sent to pharmacy by e-script For pravastatin 20mg  qd and norvasc 10mg  qd via escribe

## 2013-10-04 NOTE — Telephone Encounter (Signed)
Please call in 2 new RX's from 10/13/13 visit to [facility] / [facility]gs

## 2013-10-08 ENCOUNTER — Telehealth: Payer: Self-pay | Admitting: Cardiology

## 2013-10-08 NOTE — Telephone Encounter (Signed)
Received fax refill request  Rx #  Medication:  Enalapril 20mg  Qty  Sig:   Physician:  Wyline Mood

## 2013-10-09 MED ORDER — ENALAPRIL MALEATE 20 MG PO TABS: 20.0000 mg | ORAL_TABLET | Freq: Two times a day (BID) | ORAL | Status: DC

## 2013-10-09 NOTE — Telephone Encounter (Signed)
rx sent to pharmacy by e-script  

## 2013-10-16 ENCOUNTER — Encounter: Payer: Self-pay | Admitting: Internal Medicine

## 2013-10-25 ENCOUNTER — Telehealth: Payer: Self-pay | Admitting: *Deleted

## 2013-10-25 NOTE — Telephone Encounter (Signed)
Made f/u call to confirm home monitoring is connected.  No transmissions received yet. Pt's wife has put DSL filter between her [employer] phone jack. She encounters same problem w/ & w/out the filter.  Gave her the tech svcs number and instructed her to call them from her cell phone instead of the landline.

## 2013-11-01 ENCOUNTER — Encounter: Payer: Self-pay | Admitting: Cardiology

## 2013-11-01 ENCOUNTER — Ambulatory Visit (INDEPENDENT_AMBULATORY_CARE_PROVIDER_SITE_OTHER): Payer: Medicare Other | Admitting: Cardiology

## 2013-11-01 VITALS — BP 193/93 | HR 60 | Ht 73.0 in | Wt 199.2 lb

## 2013-11-01 DIAGNOSIS — I2510 Atherosclerotic heart disease of native coronary artery without angina pectoris: Secondary | ICD-10-CM

## 2013-11-01 DIAGNOSIS — I10 Essential (primary) hypertension: Secondary | ICD-10-CM

## 2013-11-01 DIAGNOSIS — I498 Other specified cardiac arrhythmias: Secondary | ICD-10-CM

## 2013-11-01 DIAGNOSIS — R001 Bradycardia, unspecified: Secondary | ICD-10-CM

## 2013-11-01 MED ORDER — CARVEDILOL 12.5 MG PO TABS: 12.5000 mg | ORAL_TABLET | Freq: Two times a day (BID) | ORAL | Status: DC

## 2013-11-01 NOTE — Patient Instructions (Addendum)
Your physician recommends that you schedule a follow-up appointment in: 3 months  Your physician has requested that you regularly monitor and record your blood pressure readings at home. Please use the same machine at the same time of day to check your readings and record them to bring to your follow-up visit.bring back by the office in 3 weeks to evaluate  Your physician has recommended you make the following change in your medication:   1) STOP TOPROL XL 2) START TAKING COREG 12.5MG  TWICE DAILY

## 2013-11-01 NOTE — Progress Notes (Signed)
Clinical Summary [name] is a 79 y.o.male seen today for follow up   1. Mild systolic dysfunction  - echo 07/2012 w/ LVEF 40-45%  - Cath 08/2013 w/ minimal CAD, LV gram 60% with normal filling pressures on RHC.   - reports low functional capacity due to back pain, can walk only a few feet before having to stop b/c of pain. No orthopnea, no PND  - compliant w/ medications   2. HTN  - checks bp regularly. Brought bp log in, range 140s-180s/60s-90s - does report significant back pain1-5/10, sometimes can be higher. Denies significant back pain when checking blood pressure - compliant w/ meds, off clonidine because some concern of withdrawal during last hospital admission.  - last visit started norvasc 10mg  daily  3. Bradycardia:  -s/p St Jude dual chamber pacemaker placement 08/2013  - no incision site pain, healed well. No light headedness or dizziness. Followed in device clinic with normal recent check.  - has follow up with Dr Ladona Ridgel in December 2014  4. CVA  - AMS following cath in 08/2013, MRI showed multiple lacunar infarcts possibly secondary to cath with cholesterol emboli. Unclear if clonidine withdrawal had anything to do with symptoms. AMS resolved, patient back to baseline.   5. Hypokalemia  - noted at hospital discharge, started on supplementation at discharge. - reports repeat labs by his PCP, they are not in our system  6. Cholesterol  - 09/14/13: TC 125 HDL 61 LDL 54 TG 49  - has not been on statin because of normal levels - recent cath showed coronary disease, started on pravastatin, tolerating well   Past Medical History  Diagnosis Date  . Allergic rhinitis   . Obesity   . Essential hypertension, benign   . Diaphragmatic hernia without mention of obstruction or gangrene   . Anxiety   . Anemia   . Elevated liver function tests   . Diastolic congestive heart failure   . OSA on CPAP     "wears mask part of the time" (09/10/2013)  . Diabetes mellitus    "diet controlled" (09/10/2013)  . Arthritis     "all over my body" (09/10/2013)  . Osteoporosis   . Chronic lower back pain   . Depression   . BPH (benign prostatic hypertrophy)   . Sinus node dysfunction     a. 08/2013 s/p SJM DC PPM.  . CAD (coronary artery disease)     a. 08/2013 LM nl, LAD 44m, D1 80ost, 95p, LCX 45m, RCA 20d, EF 60%.  . Lacunar infarction     a. 08/2013 post-cath, MRI: sm acute lacunar infarcts in LPCA and LSCA, ? tiny RPICA lacunar infarct.     No Known Allergies   Current Outpatient Prescriptions  Medication Sig Dispense Refill  . acetaminophen (TYLENOL) 500 MG tablet Take 500 mg by mouth every 6 (six) hours as needed for pain.      Marland Kitchen alendronate (FOSAMAX) 70 MG tablet Take 70 mg by mouth every 7 (seven) days. Take with a full glass of water on an empty stomach. On Mondays      . amLODipine (NORVASC) 10 MG tablet Take 1 tablet (10 mg total) by mouth daily.  90 tablet  1  . aspirin 81 MG tablet Take 81 mg by mouth daily.        . Calcium Carbonate-Vitamin D (CALCIUM 600 + D PO) Take 600 mg by mouth 2 (two) times daily.      . enalapril (  VASOTEC) 20 MG tablet Take 1 tablet (20 mg total) by mouth 2 (two) times daily.  60 tablet  5  . fish oil-omega-3 fatty acids 1000 MG capsule Take 1 g by mouth 2 (two) times daily.        . furosemide (LASIX) 40 MG tablet Take 1 tablet (40 mg total) by mouth daily.  30 tablet  6  . metoprolol succinate (TOPROL XL) 50 MG 24 hr tablet Take 1 tablet (50 mg total) by mouth daily. Take with or immediately following a meal.  30 tablet  6  . Multiple Vitamin (MULTIVITAMIN WITH MINERALS) TABS tablet Take 1 tablet by mouth daily.      . nitroGLYCERIN (NITROSTAT) 0.4 MG SL tablet Place 1 tablet (0.4 mg total) under the tongue every 5 (five) minutes x 3 doses as needed for chest pain.  25 tablet  3  . NONFORMULARY OR COMPOUNDED ITEM Apply 1 application topically 2 (two) times daily. Diclo 3% / baclo 2% / cyclo 2% / tetracaine 2% compounded cream       . potassium chloride SA (K-DUR,KLOR-CON) 20 MEQ tablet Take 2 tablets (40 mEq total) by mouth daily.  30 tablet  6  . pravastatin (PRAVACHOL) 20 MG tablet Take 1 tablet (20 mg total) by mouth every evening.  90 tablet  1  . tamsulosin (FLOMAX) 0.4 MG CAPS capsule Take 0.4 mg by mouth 2 (two) times daily.       . traMADol (ULTRAM) 50 MG tablet Take 50 mg by mouth at bedtime.       No current facility-administered medications for this visit.     Past Surgical History  Procedure Laterality Date  . Cardiac catheterization  1998  . Cataract extraction w/ intraocular lens  implant, bilateral  2010  . Stomach surgery  03/02/2012    ?volvus; "stomach came loose and twisted; went up under rib cage; had that corrected; tacked it up" (09/10/2013)     No Known Allergies    Family History  Problem Relation Age of Onset  . Hypertension Mother   . Hypertension Father      Social History [name] reports that he has never smoked. He has never used smokeless tobacco. [name] reports that he does not drink alcohol.   Review of Systems CONSTITUTIONAL: No weight loss, fever, chills, weakness or fatigue.  HEENT: Eyes: No visual loss, blurred vision, double vision or yellow sclerae.No hearing loss, sneezing, congestion, runny nose or sore throat.  SKIN: No rash or itching.  CARDIOVASCULAR: per HPI RESPIRATORY: per HPI GASTROINTESTINAL: No anorexia, nausea, vomiting or diarrhea. No abdominal pain or blood.  GENITOURINARY: No burning on urination, no polyuria NEUROLOGICAL: No headache, dizziness, syncope, paralysis, ataxia, numbness or tingling in the extremities. No change in bowel or bladder control.  MUSCULOSKELETAL: No muscle, back pain, joint pain or stiffness.  LYMPHATICS: No enlarged nodes. No history of splenectomy.  PSYCHIATRIC: No history of depression or anxiety.  ENDOCRINOLOGIC: No reports of sweating, cold or heat intolerance. No polyuria or polydipsia.  Marland Kitchen   Physical  Examination p 60 bp 180/90 Wt 199 lbs BMI 26 Gen: resting comfortably, no acute distress HEENT: no scleral icterus, pupils equal round and reactive, no palptable cervical adenopathy,  CV: RRR, no m/r/g, no JVD, no carotid bruits Resp: Clear to auscultation bilaterally GI: abdomen is soft, non-tender, non-distended, normal bowel sounds, no hepatosplenomegaly MSK: extremities are warm, no edema.  Skin: warm, no rash Neuro:  no focal deficits Psych: appropriate affect  Diagnostic Studies  Cardiac Catheterization 09-16-2013 Procedural Findings:  Hemodynamics  RA 3 mmHg  RV 37/1 mmHg  PA 33/12 mmHg  PCWP 6 mmHg  LV 201/4 mHg . LVEDP: 12 mmHg  AO 202/94 mmHg  Oxygen saturations:  PA 74%  AO 94%  Cardiac Output (Fick) 6.72  Cardiac Index (Fick) 3.22  Pulmonary vascular resistance (PVR): 1.9 Woods units.  Coronary angiography:  Coronary dominance: right  Left Main: Normal in size with no significant disease.  Left Anterior Descending (LAD): Large in size with mild diffuse atherosclerosis. There is diffuse 20% disease in the midsegment.  1st diagonal (D1): Normal in size with 80% ostial stenosis followed by a 95% proximal stenosis.  2nd diagonal (D2): Medium in size with minor irregularities.  3rd diagonal (D3): Normal in size with no significant disease.  Circumflex (LCx): Normal in size and nondominant. There is 20% mid stenosis.  1st obtuse marginal: Medium in size with minor irregularities.  2nd obtuse marginal: Large is in size with no significant disease.  3rd obtuse marginal: Normal in size with minor irregularities.  Right Coronary Artery: large in size and dominant. There is 20% stenosis distally.  Posterior descending artery: normal in size with no significant disease.  Posterior AV segment: normal in size with no significant disease.  Posterolateral branchs: PL 1 is large with no significant disease. PL 2 is small. Left ventriculography: Left ventricular systolic  function is normal , LVEF is estimated at 60 %, there is no significant mitral regurgitation  07/2012 Echo: mild LVH, LVEF 40-45%, LAE, mild MR,       Assessment and Plan   1. Systolic dysfunction  - appears to have normalized based on LV gram from cath, echo from last years showed mildly decreased function.  - appears euvolemic today, continue current medical therapy    2. CAD  - patent major arteries with evidence of disease in diags and OMs from recent cath  - continue risk factor modfications, started on low dose statin in the setting of known CAD   3. HTN  - elevated blood pressures - possible severe withdrawal to clonidine during last admit, will avoid this medication from here on out  - will change Toprol to coreg for more blood pressure effect, start at 12.5 mg bid and titrated if need at follow up - patient is to bring in a bp log in 3 weeks, pending those results make further adjustments.  4. Bradycardia  -symptoms of dizziness and presyncope resolved with pacemaker placement  - he has follow up with Dr Ladona Ridgel next month  5. CVA  - evidence on MRI of lacunar infarcts during recent hospital admit, possibly related to cath  - on ASA, statin for secondary prevention.   6. Cholesterol  - numbers are at goal, started low dose statin in setting of known CAD and prior CVA.     BP log to be submitted in 3 weeks, follow up with me in 3 months    Antoine Poche, M.D., F.A.C.C.

## 2013-11-28 ENCOUNTER — Telehealth: Payer: Self-pay | Admitting: *Deleted

## 2013-11-28 NOTE — Telephone Encounter (Signed)
Message copied by Ovidio Kin on Thu Nov 28, 2013  2:30 PM ------      Message from: [place]      Created: Thu Nov 28, 2013  1:49 PM       Please let patient know that his blood pressures are better but are still a bit on the high side. I would like to avoid adding another medicine if possible, he needs to make every effort to limit the salt in his diet, and also do what he can diet wise to lose weight. I will see him in February, if not better then then we will have to add a new medication                  Dina Rich ------

## 2013-11-28 NOTE — Telephone Encounter (Signed)
Noted line busy times 3 will call back again

## 2013-11-29 NOTE — Telephone Encounter (Signed)
Spoke to pt to advise results/instructions. Pt understood.This nurse offered to mail the pt a low sodium diet, pt agreed, address corrected in the chart and advised the mail will go out on Saturday, pt will keep upcoming apt with Dr. Lewayne Bunting 12-02-13 and with Dr Wyline Mood on 02-05-13, pt understood

## 2013-12-02 ENCOUNTER — Telehealth: Payer: Self-pay | Admitting: Internal Medicine

## 2013-12-02 ENCOUNTER — Encounter: Payer: Self-pay | Admitting: Internal Medicine

## 2013-12-02 ENCOUNTER — Ambulatory Visit (INDEPENDENT_AMBULATORY_CARE_PROVIDER_SITE_OTHER): Payer: Medicare Other | Admitting: Internal Medicine

## 2013-12-02 VITALS — BP 180/78 | HR 64 | Ht 73.0 in | Wt 203.0 lb

## 2013-12-02 DIAGNOSIS — I10 Essential (primary) hypertension: Secondary | ICD-10-CM

## 2013-12-02 DIAGNOSIS — Z950 Presence of cardiac pacemaker: Secondary | ICD-10-CM

## 2013-12-02 DIAGNOSIS — I495 Sick sinus syndrome: Secondary | ICD-10-CM

## 2013-12-02 LAB — MDC_IDC_ENUM_SESS_TYPE_INCLINIC
Date Time Interrogation Session: 20141208145044
Implantable Pulse Generator Model: 2240
Implantable Pulse Generator Serial Number: 7536055
Lead Channel Pacing Threshold Amplitude: 0.5 V
Lead Channel Pacing Threshold Amplitude: 0.625 V
Lead Channel Pacing Threshold Pulse Width: 0.5 ms
Lead Channel Sensing Intrinsic Amplitude: 6.4 mV
Lead Channel Setting Pacing Pulse Width: 0.5 ms

## 2013-12-02 MED ORDER — CARVEDILOL 25 MG PO TABS: 25.0000 mg | ORAL_TABLET | Freq: Two times a day (BID) | ORAL | Status: DC

## 2013-12-02 NOTE — Telephone Encounter (Signed)
FYI: Pt called to let us know that he is not taking the Losartan.

## 2013-12-02 NOTE — Assessment & Plan Note (Signed)
His [employer] Jude DDD PM is working normally. Will recheck in several months. 

## 2013-12-02 NOTE — Progress Notes (Signed)
HPI [name] returns today for followup. He is a pleasant 79 yo man with a h/o symptomatic bradycardia, s/p PPM insertion, HTN, and peripheral edema. He has chronic back pain which limits his ability to ambulate. He denies dietary indiscretion. No Known Allergies   Current Outpatient Prescriptions  Medication Sig Dispense Refill  . acetaminophen (TYLENOL) 500 MG tablet Take 500 mg by mouth every 6 (six) hours as needed for pain.      Marland Kitchen alendronate (FOSAMAX) 70 MG tablet Take 70 mg by mouth every 7 (seven) days. Take with a full glass of water on an empty stomach. On Mondays      . amLODipine (NORVASC) 10 MG tablet Take 1 tablet (10 mg total) by mouth daily.  90 tablet  1  . aspirin 81 MG tablet Take 81 mg by mouth daily.        . Calcium Carbonate-Vitamin D (CALCIUM 600 + D PO) Take 600 mg by mouth 2 (two) times daily.      . carvedilol (COREG) 12.5 MG tablet Take 1 tablet (12.5 mg total) by mouth 2 (two) times daily.  180 tablet  1  . enalapril (VASOTEC) 20 MG tablet Take 1 tablet (20 mg total) by mouth 2 (two) times daily.  60 tablet  5  . fish oil-omega-3 fatty acids 1000 MG capsule Take 1 g by mouth 2 (two) times daily.        . furosemide (LASIX) 40 MG tablet Take 1 tablet (40 mg total) by mouth daily.  30 tablet  6  . Multiple Vitamin (MULTIVITAMIN WITH MINERALS) TABS tablet Take 1 tablet by mouth daily.      . nitroGLYCERIN (NITROSTAT) 0.4 MG SL tablet Place 1 tablet (0.4 mg total) under the tongue every 5 (five) minutes x 3 doses as needed for chest pain.  25 tablet  3  . NONFORMULARY OR COMPOUNDED ITEM Apply 1 application topically 2 (two) times daily. Diclo 3% / baclo 2% / cyclo 2% / tetracaine 2% compounded cream      . potassium chloride SA (K-DUR,KLOR-CON) 20 MEQ tablet Take 2 tablets (40 mEq total) by mouth daily.  30 tablet  6  . pravastatin (PRAVACHOL) 20 MG tablet Take 1 tablet (20 mg total) by mouth every evening.  90 tablet  1  . tamsulosin (FLOMAX) 0.4 MG CAPS  capsule Take 0.4 mg by mouth 2 (two) times daily.       . traMADol (ULTRAM) 50 MG tablet Take 50 mg by mouth at bedtime.      Marland Kitchen losartan (COZAAR) 100 MG tablet        No current facility-administered medications for this visit.     Past Medical History  Diagnosis Date  . Allergic rhinitis   . Obesity   . Essential hypertension, benign   . Diaphragmatic hernia without mention of obstruction or gangrene   . Anxiety   . Anemia   . Elevated liver function tests   . Diastolic congestive heart failure   . OSA on CPAP     "wears mask part of the time" (09/10/2013)  . Diabetes mellitus     "diet controlled" (09/10/2013)  . Arthritis     "all over my body" (09/10/2013)  . Osteoporosis   . Chronic lower back pain   . Depression   . BPH (benign prostatic hypertrophy)   . Sinus node dysfunction     a. 08/2013 s/p SJM DC PPM.  . CAD (coronary artery  disease)     a. 08/2013 LM nl, LAD 73m, D1 80ost, 95p, LCX 48m, RCA 20d, EF 60%.  . Lacunar infarction     a. 08/2013 post-cath, MRI: sm acute lacunar infarcts in LPCA and LSCA, ? tiny RPICA lacunar infarct.    ROS:   All systems reviewed and negative except as noted in the HPI.   Past Surgical History  Procedure Laterality Date  . Cardiac catheterization  1998  . Cataract extraction w/ intraocular lens  implant, bilateral  2010  . Stomach surgery  03/02/2012    ?volvus; "stomach came loose and twisted; went up under rib cage; had that corrected; tacked it up" (09/10/2013)     Family History  Problem Relation Age of Onset  . Hypertension Mother   . Hypertension Father      History   Social History  . Marital Status: Married    Spouse Name: N/A    Number of Children: 2  . Years of Education: N/A   Occupational History  . MERCHANDISING FOR [employer]     WORKED IN [employer]   Social History Main Topics  . Smoking status: Never Smoker   . Smokeless tobacco: Never Used  . Alcohol Use: No  . Drug Use: No  . Sexual  Activity: No   Other Topics Concern  . Not on file   Social History Narrative  . No narrative on file     BP 180/78  Pulse 64  Ht 6\' 1"  (1.854 m)  Wt 203 lb (92.08 kg)  BMI 26.79 kg/m2  Physical Exam:  Well appearing elderly man, NAD HEENT: Unremarkable Neck:  No JVD, no thyromegally Back:  No CVA tenderness Lungs:  Clear with no wheezes, rales, or rhonchi HEART:  Regular rate rhythm, no murmurs, no rubs, no clicks Abd:  soft, positive bowel sounds, no organomegally, no rebound, no guarding Ext:  2 plus pulses, no edema, no cyanosis, no clubbing Skin:  No rashes no nodules Neuro:  CN II through XII intact, motor grossly intact   DEVICE  Normal device function.  See PaceArt for details.   Assess/Plan:

## 2013-12-02 NOTE — Telephone Encounter (Signed)
Medication concerns / please return call / tgs

## 2013-12-02 NOTE — Assessment & Plan Note (Signed)
His  Blood pressure is elevated and he has peripheral edema. I have asked him to stop amlodipine and increase his dose of coreg to 25 mg twice daily. He will reduce his sodium intake.

## 2013-12-02 NOTE — Patient Instructions (Addendum)
Your physician recommends that you schedule a follow-up appointment in: September with Dr Ladona Ridgel and Osvaldo Shipper on 03-05-14 You will receive a reminder letter two months in advance reminding you to call and schedule your appointment. If you don't receive this letter, please contact our office.  Your physician has recommended you make the following change in your medication:  1. STOP AMLODIPINE  2.Increase  Carvedilol to 25 mg twice a day

## 2013-12-03 ENCOUNTER — Encounter: Payer: Self-pay | Admitting: Internal Medicine

## 2014-01-17 ENCOUNTER — Ambulatory Visit (INDEPENDENT_AMBULATORY_CARE_PROVIDER_SITE_OTHER): Payer: Medicare Other | Admitting: Internal Medicine

## 2014-01-17 ENCOUNTER — Encounter: Payer: Self-pay | Admitting: Internal Medicine

## 2014-01-17 VITALS — BP 148/102 | HR 60 | Ht 72.0 in | Wt 206.0 lb

## 2014-01-17 DIAGNOSIS — G4733 Obstructive sleep apnea (adult) (pediatric): Secondary | ICD-10-CM

## 2014-01-17 DIAGNOSIS — E669 Obesity, unspecified: Secondary | ICD-10-CM

## 2014-01-17 NOTE — Patient Instructions (Signed)
We can continue to look at options for Sleep Apnea. Please call as needed.

## 2014-01-17 NOTE — Progress Notes (Signed)
12/15/11- 78 yoM never smoker followed for Allergic rhinitis, hx OSA, dyspnea, complicated by DM, iron def anemia, HBP, CHF LOV-12/15/10 Had flu vax. His degenerative disk disease is his major limitation- inoperable per NSGY. Comfortable with CPAP 12/ [facility].  Notes marked dyspnea bending over- crowds him. Chronic edema- tried diuretics.   12/06/12- 79 yoM never smoker followed for Allergic rhinitis, hx OSA, dyspnea, complicated by DM, iron def anemia, HBP, CHF,  Degen Disk Dis FOLLOWS QMG:QQPYPP not sleeping well overall-recently had stomach surgery Reports laparotomy at [facility] in March for what may have been a volvulus- I don't have those records. No respiratory complications known to patient. OSA- not using CPAP regularly. Cites unable to lie comfortably to sleep due to chronic hip and back pain from known inoperable arthritis and disk disease, as well as frequent nocturia.  Rhinitis not big problem. Little dyspnea with no cough or wheeze. Feet have swollen for 2-3 years. His PCP manages this with lasix. Can't keep feet elevated enough of time to help.  Note weight 2011 was 202 lbs, now 196 lbs.  01/17/13- 79 yoM never smoker followed for Allergic rhinitis, hx OSA, dyspnea, complicated by DM, iron def anemia, HBP, CHF,  Degen Disk Dis FOLLOWS FOR: not using CPAP; [facility]  faxing ONO results to [place] 12/26/2012- did not qualify for O2 with sleep.  He still occasionally uses CPAP but says he has too much arthritis and back pain. Because of this he changes position and is up and down between chair in bed a lot during the night. CPAP was too difficult. CXR 12/25/12 IMPRESSION:  No acute cardiopulmonary abnormality seen.  Original Report Authenticated By: Marijo Conception., M.D.   01/17/14- 61 yoM never smoker followed for Allergic rhinitis, hx OSA, dyspnea, complicated by DM, iron def anemia, HBP, CHF/ pacemaker,  Degen Disk Dis Follows for: pt has not been wearing cpap lately.  Pt has  had a pacemaker placed since LV.  Pt's wife states he snores occasionally. The patient admits he falls asleep if he sits. Does not wear CPAP because he is up and down all night due to back pain which contributes to daytime sleepiness. Frequent nocturia. CPAP was too confining. CXR 09/14/13 IMPRESSION:  Left subclavian dual lead pacemaker in satisfactory position. No  pneumothorax.  Electronically Signed  By: Julian Hy M.D.  On: 09/14/2013 10:04   ROS-see HPI Constitutional:   No-  acute weight loss, night sweats, fevers, chills,  +fatigue, lassitude. HEENT:   No-  headaches, difficulty swallowing, tooth/dental problems, sore throat,       No-  sneezing, itching, ear ache, nasal congestion, post nasal drip,  CV:  No-   chest pain, orthopnea, PND, +swelling in lower extremities, No-anasarca,  dizziness, palpitations Resp:  No change in mild chronic shortness of breath with exertion or at rest.              No-   productive cough,  No non-productive cough,  No- coughing up of blood.              No-   change in color of mucus.  No- wheezing.   Skin: No-   rash or lesions. GI:  No-   heartburn, indigestion, abdominal pain, nausea, vomiting,  GU: +nocturia MS:  +   joint pain or swelling.  + decreased range of motion.  + back pain. Neuro-     nothing unusual Psych:  No- change in mood or affect. No depression or  anxiety.  No memory loss.  OBJ General- Alert, Oriented, Affect-appropriate, Distress- none acute, rolling walker, overweight Skin- rash-none, lesions- none, excoriation- none Lymphadenopathy- none Head- atraumatic            Eyes- Gross vision intact, PERRLA, conjunctivae clear secretions            Ears- Hearing aid            Nose- Clear, no-Septal dev, mucus, polyps, erosion, perforation             Throat- Mallampati III-IV , mucosa clear , drainage- none, tonsils- atrophic. Own teeth Neck- flexible , trachea midline, no stridor , thyroid nl, carotid no bruit Chest -  symmetrical excursion , unlabored           Heart/CV- RRR , no murmur , no gallop  , no rub, nl s1 s2                           - JVD- none , edema- 3-4+, stasis changes- none, varices- none           Lung- clear to P&A, wheeze- none, cough- none , dullness-none, rub- none. No rales heard           Chest wall- +R pacemaker Abd-  Br/ Gen/ Rectal- Not done, not indicated Extrem- cyanosis- none, clubbing, none, atrophy- none, strength- nl Neuro- grossly intact to observation

## 2014-02-05 ENCOUNTER — Ambulatory Visit (INDEPENDENT_AMBULATORY_CARE_PROVIDER_SITE_OTHER): Payer: Medicare Other | Admitting: Cardiology

## 2014-02-05 DIAGNOSIS — R001 Bradycardia, unspecified: Secondary | ICD-10-CM

## 2014-02-05 DIAGNOSIS — I10 Essential (primary) hypertension: Secondary | ICD-10-CM

## 2014-02-05 DIAGNOSIS — I519 Heart disease, unspecified: Secondary | ICD-10-CM

## 2014-02-05 DIAGNOSIS — I498 Other specified cardiac arrhythmias: Secondary | ICD-10-CM

## 2014-02-05 MED ORDER — AMLODIPINE BESYLATE 10 MG PO TABS: 10.0000 mg | ORAL_TABLET | Freq: Every day | ORAL | Status: DC

## 2014-02-05 MED ORDER — FUROSEMIDE 40 MG PO TABS: 40.0000 mg | ORAL_TABLET | Freq: Every day | ORAL | Status: DC

## 2014-02-05 NOTE — Progress Notes (Signed)
Clinical Summary [name] is a 80 y.o.male seen today for follow up of the following medical problems.   1. Mild systolic dysfunction  - echo 07/2012 w/ LVEF 40-45%  - Cath 08/2013 w/ minimal CAD, LV gram 60% with normal filling pressures on RHC.  - reports low functional capacity due to back pain, can walk only a few feet before having to stop b/c of pain. No orthopnea, no PND  - compliant w/ medications   2. HTN  - checks bp regularly. Brought bp log in, range 140s-180s/60s-100s  - last visit norvasc was stopped due to LE edema, however patient reports he realized he had not gotten his lasix from pharmacy for the last 2 months   3. Bradycardia:  -s/p St Jude dual chamber pacemaker placement 08/2013  - no incision site pain, healed well. No light headedness or dizziness. Followed in device clinic with normal recent check.    4. Cholesterol  - 09/14/13: TC 125 HDL 61 LDL 54 TG 49  - has not been on statin because of normal levels  - recent cath showed non-obstrucitve coronary disease, started on pravastatin, tolerating well      Past Medical History  Diagnosis Date  . Allergic rhinitis   . Obesity   . Essential hypertension, benign   . Diaphragmatic hernia without mention of obstruction or gangrene   . Anxiety   . Anemia   . Elevated liver function tests   . Diastolic congestive heart failure   . OSA on CPAP     "wears mask part of the time" (09/10/2013)  . Diabetes mellitus     "diet controlled" (09/10/2013)  . Arthritis     "all over my body" (09/10/2013)  . Osteoporosis   . Chronic lower back pain   . Depression   . BPH (benign prostatic hypertrophy)   . Sinus node dysfunction     a. 08/2013 s/p SJM DC PPM.  . CAD (coronary artery disease)     a. 08/2013 LM nl, LAD 42m, D1 80ost, 95p, LCX 64m, RCA 20d, EF 60%.  . Lacunar infarction     a. 08/2013 post-cath, MRI: sm acute lacunar infarcts in LPCA and LSCA, ? tiny RPICA lacunar infarct.     No Known  Allergies   Current Outpatient Prescriptions  Medication Sig Dispense Refill  . acetaminophen (TYLENOL) 500 MG tablet Take 500 mg by mouth every 6 (six) hours as needed for pain.      Marland Kitchen alendronate (FOSAMAX) 70 MG tablet Take 70 mg by mouth every 7 (seven) days. Take with a full glass of water on an empty stomach. On Mondays      . aspirin 81 MG tablet Take 81 mg by mouth daily.        . Calcium Carbonate-Vitamin D (CALCIUM 600 + D PO) Take 600 mg by mouth 2 (two) times daily.      . carvedilol (COREG) 25 MG tablet Take 1 tablet (25 mg total) by mouth 2 (two) times daily.  60 tablet  11  . enalapril (VASOTEC) 20 MG tablet Take 1 tablet (20 mg total) by mouth 2 (two) times daily.  60 tablet  5  . fish oil-omega-3 fatty acids 1000 MG capsule Take 1 g by mouth 2 (two) times daily.        . furosemide (LASIX) 40 MG tablet Take 1 tablet (40 mg total) by mouth daily.  30 tablet  6  . Multiple Vitamin (MULTIVITAMIN WITH  MINERALS) TABS tablet Take 1 tablet by mouth daily.      . nitroGLYCERIN (NITROSTAT) 0.4 MG SL tablet Place 1 tablet (0.4 mg total) under the tongue every 5 (five) minutes x 3 doses as needed for chest pain.  25 tablet  3  . NONFORMULARY OR COMPOUNDED ITEM Apply 1 application topically 2 (two) times daily. Diclo 3% / baclo 2% / cyclo 2% / tetracaine 2% compounded cream      . potassium chloride SA (K-DUR,KLOR-CON) 20 MEQ tablet Take 2 tablets (40 mEq total) by mouth daily.  30 tablet  6  . pravastatin (PRAVACHOL) 20 MG tablet Take 1 tablet (20 mg total) by mouth every evening.  90 tablet  1  . tamsulosin (FLOMAX) 0.4 MG CAPS capsule Take 0.4 mg by mouth 2 (two) times daily.       . traMADol (ULTRAM) 50 MG tablet Take 50 mg by mouth at bedtime.       No current facility-administered medications for this visit.     Past Surgical History  Procedure Laterality Date  . Cardiac catheterization  1998  . Cataract extraction w/ intraocular lens  implant, bilateral  2010  . Stomach  surgery  03/02/2012    ?volvus; "stomach came loose and twisted; went up under rib cage; had that corrected; tacked it up" (09/10/2013)     No Known Allergies    Family History  Problem Relation Age of Onset  . Hypertension Mother   . Hypertension Father      Social History [name] reports that he has never smoked. He has never used smokeless tobacco. [name] reports that he does not drink alcohol.   Review of Systems CONSTITUTIONAL: No weight loss, fever, chills, weakness or fatigue.  HEENT: Eyes: No visual loss, blurred vision, double vision or yellow sclerae.No hearing loss, sneezing, congestion, runny nose or sore throat.  SKIN: No rash or itching.  CARDIOVASCULAR: per HPI RESPIRATORY: No shortness of breath, cough or sputum.  GASTROINTESTINAL: No anorexia, nausea, vomiting or diarrhea. No abdominal pain or blood.  GENITOURINARY: No burning on urination, no polyuria NEUROLOGICAL: No headache, dizziness, syncope, paralysis, ataxia, numbness or tingling in the extremities. No change in bowel or bladder control.  MUSCULOSKELETAL: No muscle, back pain, joint pain or stiffness.  LYMPHATICS: No enlarged nodes. No history of splenectomy.  PSYCHIATRIC: No history of depression or anxiety.  ENDOCRINOLOGIC: No reports of sweating, cold or heat intolerance. No polyuria or polydipsia.  Marland Kitchen   Physical Examination p 59 bp 200/110 Gen: resting comfortably, no acute distress HEENT: no scleral icterus, pupils equal round and reactive, no palptable cervical adenopathy,  CV: RRR, no m/r/g, no JVD, no carotid bruits Resp: Clear to auscultation bilaterally GI: abdomen is soft, non-tender, non-distended, normal bowel sounds, no hepatosplenomegaly MSK: extremities are warm, 1+ bilateral edema Skin: warm, no rash Neuro:  no focal deficits Psych: appropriate affect   Diagnostic Studies Cardiac Catheterization 9.17.2014  Procedural Findings:  Hemodynamics  RA 3 mmHg  RV 37/1 mmHg  PA  33/12 mmHg  PCWP 6 mmHg  LV 201/4 mHg . LVEDP: 12 mmHg  AO 202/94 mmHg  Oxygen saturations:  PA 74%  AO 94%  Cardiac Output (Fick) 6.72  Cardiac Index (Fick) 3.22  Pulmonary vascular resistance (PVR): 1.9 Woods units.  Coronary angiography:  Coronary dominance: right  Left Main: Normal in size with no significant disease.  Left Anterior Descending (LAD): Large in size with mild diffuse atherosclerosis. There is diffuse 20% disease in the  midsegment.  1st diagonal (D1): Normal in size with 80% ostial stenosis followed by a 95% proximal stenosis.  2nd diagonal (D2): Medium in size with minor irregularities.  3rd diagonal (D3): Normal in size with no significant disease.  Circumflex (LCx): Normal in size and nondominant. There is 20% mid stenosis.  1st obtuse marginal: Medium in size with minor irregularities.  2nd obtuse marginal: Large is in size with no significant disease.  3rd obtuse marginal: Normal in size with minor irregularities.  Right Coronary Artery: large in size and dominant. There is 20% stenosis distally.  Posterior descending artery: normal in size with no significant disease.  Posterior AV segment: normal in size with no significant disease.  Posterolateral branchs: PL 1 is large with no significant disease. PL 2 is small. Left ventriculography: Left ventricular systolic function is normal , LVEF is estimated at 60 %, there is no significant mitral regurgitation   07/2012 Echo: mild LVH, LVEF 40-45%, LAE, mild MR,      Assessment and Plan  1. Systolic dysfunction  - appears to have normalized based on LV gram from cath, echo from last years showed mildly decreased function.  - evidence of mild volume overload, he has mistakenly been off his lasix x 2 months, will reorder.    2. CAD  - patent major arteries with evidence of disease in diags and OMs from recent cath  - continue risk factor modfications, started on low dose statin in the setting of known CAD   3.  HTN  - elevated blood pressures  - possible severe withdrawal to clonidine during last admit, will avoid this medication from here on out  - norvasc stopped last EP visit due to concern about LE edema, however appears patient had unknowingly been off his lasix - restart norvasc 10 mg - patient is to keep bp log and submit in 2 weeks.   4. Bradycardia  -symptoms of dizziness and presyncope resolved with pacemaker placement  - he has follow up with Dr Lovena Le next month   5. Cholesterol  - numbers are at goal, started low dose statin in setting of known CAD and prior CVA.    Follow up 3 months    [name], M.D., F.A.C.C.

## 2014-02-05 NOTE — Patient Instructions (Addendum)
Your physician recommends that you schedule a follow-up appointment in: 3 months  Your physician has requested that you regularly monitor and record your blood pressure readings at home. Please use the same machine at the same time of day to check your readings and record them to bring to your follow-up visit.BRING YOUR LOG BACK TO OUR OFFICE IN 2 WEEKS AND GIVE TO THE FRONT DESK FOR DR [facility] TO REVIEW   Your physician has recommended you make the following change in your medication:   1) START NORVASC 10MG  ONCE DAILY

## 2014-02-07 ENCOUNTER — Telehealth: Payer: Self-pay | Admitting: Cardiology

## 2014-02-07 MED ORDER — PRAVASTATIN SODIUM 20 MG PO TABS: 20.0000 mg | ORAL_TABLET | Freq: Every evening | ORAL | Status: DC

## 2014-02-07 NOTE — Telephone Encounter (Signed)
rx refilled.

## 2014-02-07 NOTE — Telephone Encounter (Signed)
Received fax refill request  Rx # [id number]  Medication:  Pravastatin 20 mg tab Qty 90 Sig:  Take one tablet by mouth every evening Physician:  Harl Bowie

## 2014-02-15 NOTE — Assessment & Plan Note (Signed)
Weight loss encouraged, explaining that obesity aggravates sleep apnea

## 2014-02-15 NOTE — Assessment & Plan Note (Signed)
CPAP is more aggravating than it is worth because he has to keep getting up so much at night. We talked about whether to get a new sleep study, possibly unattended, before Evaluation for an oral appliance. Plan-consider options and follow for now. Possible referral for oral appliance in the future

## 2014-02-19 ENCOUNTER — Encounter (INDEPENDENT_AMBULATORY_CARE_PROVIDER_SITE_OTHER): Payer: Medicare Other | Admitting: Ophthalmology

## 2014-02-19 DIAGNOSIS — H3530 Unspecified macular degeneration: Secondary | ICD-10-CM

## 2014-02-19 DIAGNOSIS — H43819 Vitreous degeneration, unspecified eye: Secondary | ICD-10-CM

## 2014-02-19 DIAGNOSIS — H348392 Tributary (branch) retinal vein occlusion, unspecified eye, stable: Secondary | ICD-10-CM

## 2014-02-19 DIAGNOSIS — I10 Essential (primary) hypertension: Secondary | ICD-10-CM

## 2014-02-19 DIAGNOSIS — H35039 Hypertensive retinopathy, unspecified eye: Secondary | ICD-10-CM

## 2014-03-05 ENCOUNTER — Ambulatory Visit (INDEPENDENT_AMBULATORY_CARE_PROVIDER_SITE_OTHER): Payer: Medicare Other | Admitting: *Deleted

## 2014-03-05 DIAGNOSIS — I495 Sick sinus syndrome: Secondary | ICD-10-CM

## 2014-03-05 DIAGNOSIS — Z950 Presence of cardiac pacemaker: Secondary | ICD-10-CM

## 2014-03-05 LAB — MDC_IDC_ENUM_SESS_TYPE_REMOTE
Battery Remaining Longevity: 119 mo
Battery Voltage: 3.01 V
Brady Statistic AP VP Percent: 83 %
Brady Statistic RV Percent Paced: 94 %
Implantable Pulse Generator Model: 2240
Lead Channel Impedance Value: 400 Ohm
Lead Channel Pacing Threshold Amplitude: 0.625 V
Lead Channel Pacing Threshold Amplitude: 0.625 V
Lead Channel Pacing Threshold Pulse Width: 0.5 ms
MDC IDC MSMT LEADCHNL RA SENSING INTR AMPL: 4.3 mV
MDC IDC MSMT LEADCHNL RV IMPEDANCE VALUE: 450 Ohm
MDC IDC MSMT LEADCHNL RV PACING THRESHOLD PULSEWIDTH: 0.5 ms
MDC IDC MSMT LEADCHNL RV SENSING INTR AMPL: 8.1 mV
MDC IDC PG SERIAL: 7536055
MDC IDC SESS DTM: 20150310231610
MDC IDC SET LEADCHNL RA PACING AMPLITUDE: 1.625
MDC IDC SET LEADCHNL RV PACING AMPLITUDE: 0.875
MDC IDC SET LEADCHNL RV PACING PULSEWIDTH: 0.5 ms
MDC IDC SET LEADCHNL RV SENSING SENSITIVITY: 2 mV
MDC IDC STAT BRADY AP VS PERCENT: 1 %
MDC IDC STAT BRADY AS VP PERCENT: 12 %
MDC IDC STAT BRADY AS VS PERCENT: 5.4 %
MDC IDC STAT BRADY RA PERCENT PACED: 83 %

## 2014-03-06 ENCOUNTER — Encounter: Payer: Self-pay | Admitting: Cardiology

## 2014-03-10 ENCOUNTER — Telehealth: Payer: Self-pay | Admitting: Cardiology

## 2014-03-10 MED ORDER — TAMSULOSIN HCL 0.4 MG PO CAPS: 0.4000 mg | ORAL_CAPSULE | Freq: Two times a day (BID) | ORAL | Status: DC

## 2014-03-10 NOTE — Telephone Encounter (Signed)
Received fax refill request  Rx # [id number] Medication:  Pravastatin 20 mg  Qty 30 Sig:  Take one tablet by mouth every evening Physician:  Harl Bowie

## 2014-03-10 NOTE — Telephone Encounter (Signed)
Medication sent via escribe.  

## 2014-03-13 ENCOUNTER — Encounter: Payer: Self-pay | Admitting: *Deleted

## 2014-03-13 ENCOUNTER — Telehealth: Payer: Self-pay | Admitting: *Deleted

## 2014-03-13 NOTE — Telephone Encounter (Signed)
Pt dropped off BP form with front desk, put in Dr Nelly Laurence folder to review.

## 2014-03-17 ENCOUNTER — Encounter: Payer: Self-pay | Admitting: *Deleted

## 2014-03-18 ENCOUNTER — Encounter (INDEPENDENT_AMBULATORY_CARE_PROVIDER_SITE_OTHER): Payer: Medicare Other | Admitting: Ophthalmology

## 2014-03-18 DIAGNOSIS — H35039 Hypertensive retinopathy, unspecified eye: Secondary | ICD-10-CM

## 2014-03-18 DIAGNOSIS — H3530 Unspecified macular degeneration: Secondary | ICD-10-CM

## 2014-03-18 DIAGNOSIS — H348392 Tributary (branch) retinal vein occlusion, unspecified eye, stable: Secondary | ICD-10-CM

## 2014-03-18 DIAGNOSIS — I10 Essential (primary) hypertension: Secondary | ICD-10-CM

## 2014-03-18 DIAGNOSIS — H43819 Vitreous degeneration, unspecified eye: Secondary | ICD-10-CM

## 2014-03-19 ENCOUNTER — Telehealth: Payer: Self-pay | Admitting: Cardiology

## 2014-03-19 NOTE — Telephone Encounter (Signed)
Please let patient know that blood pressure log shows overall good numbers.  Carlyle Dolly MD

## 2014-03-19 NOTE — Telephone Encounter (Signed)
Left message to pt and made him aware.

## 2014-03-20 ENCOUNTER — Encounter: Payer: Self-pay | Admitting: Cardiology

## 2014-03-24 ENCOUNTER — Encounter: Payer: Self-pay | Admitting: Internal Medicine

## 2014-04-01 ENCOUNTER — Telehealth: Payer: Self-pay | Admitting: Cardiology

## 2014-04-01 MED ORDER — ENALAPRIL MALEATE 20 MG PO TABS: 20.0000 mg | ORAL_TABLET | Freq: Two times a day (BID) | ORAL | Status: DC

## 2014-04-01 NOTE — Telephone Encounter (Signed)
Received fax refill request  Rx # [id number] Medication:  Enalapril 20 mg Qty 60 Sig:  Take one tablet by mouth twice daily Physician:  Harl Bowie

## 2014-04-01 NOTE — Telephone Encounter (Signed)
Medication sent via escribe.  

## 2014-04-09 ENCOUNTER — Telehealth: Payer: Self-pay | Admitting: Cardiology

## 2014-04-09 NOTE — Telephone Encounter (Signed)
Received fax refill request  Rx # [id number] Medication:  Tamsulosin 0.4 mg  Qty 30 Sig:  Take one capsule by mouth twice daily Physician:  Harl Bowie

## 2014-04-09 NOTE — Telephone Encounter (Signed)
Refill flomax to pcp  Spoke with Sam at [facility] drug will refer to Dr Woody Seller

## 2014-04-16 ENCOUNTER — Encounter (INDEPENDENT_AMBULATORY_CARE_PROVIDER_SITE_OTHER): Payer: Medicare Other | Admitting: Ophthalmology

## 2014-04-16 DIAGNOSIS — I10 Essential (primary) hypertension: Secondary | ICD-10-CM

## 2014-04-16 DIAGNOSIS — H3530 Unspecified macular degeneration: Secondary | ICD-10-CM

## 2014-04-16 DIAGNOSIS — H35039 Hypertensive retinopathy, unspecified eye: Secondary | ICD-10-CM

## 2014-04-16 DIAGNOSIS — H348392 Tributary (branch) retinal vein occlusion, unspecified eye, stable: Secondary | ICD-10-CM

## 2014-04-16 DIAGNOSIS — H43819 Vitreous degeneration, unspecified eye: Secondary | ICD-10-CM

## 2014-05-12 ENCOUNTER — Ambulatory Visit: Payer: Medicare Other | Admitting: Cardiology

## 2014-05-12 ENCOUNTER — Telehealth: Payer: Self-pay

## 2014-05-12 NOTE — Telephone Encounter (Signed)
Pt notified of MD's message

## 2014-05-12 NOTE — Telephone Encounter (Signed)
Message copied by Bernita Raisin on Mon May 12, 2014  2:55 PM ------      Message from: [place] F      Created: Mon May 12, 2014  2:45 PM       Please let patient kow that bp log looks good            Zandra Abts MD ------

## 2014-05-13 ENCOUNTER — Encounter: Payer: Self-pay | Admitting: Cardiology

## 2014-05-13 ENCOUNTER — Ambulatory Visit (INDEPENDENT_AMBULATORY_CARE_PROVIDER_SITE_OTHER): Payer: Medicare Other | Admitting: Cardiology

## 2014-05-13 VITALS — BP 153/89 | HR 60 | Ht 72.0 in | Wt 207.0 lb

## 2014-05-13 DIAGNOSIS — R001 Bradycardia, unspecified: Secondary | ICD-10-CM

## 2014-05-13 DIAGNOSIS — I10 Essential (primary) hypertension: Secondary | ICD-10-CM

## 2014-05-13 DIAGNOSIS — I498 Other specified cardiac arrhythmias: Secondary | ICD-10-CM

## 2014-05-13 DIAGNOSIS — I519 Heart disease, unspecified: Secondary | ICD-10-CM

## 2014-05-13 NOTE — Progress Notes (Signed)
Clinical Summary Mr. Leitz is a 80 y.o.male seen today for follow up of the following medical problems  1. Mild systolic dysfunction  - echo 07/2012 w/ LVEF 40-45%  - Cath 08/2013 w/ minimal CAD, LV gram 60% with normal filling pressures on RHC.  - reports low functional capacity due to back pain, can walk only a few feet before having to stop b/c of pain. No orthopnea, no PND. Occasional LE edema  - compliant w/ medications   2. HTN  - checks bp regularly, submitted bp log last week with numbers at goal  3. Bradycardia:  -s/p St Jude dual chamber pacemaker  - Followed in device clinic with normal recent check 02/2014 - denies any recent symptoms  4. Cholesterol  - 09/14/13: TC 125 HDL 61 LDL 54 TG 49  - has not been on statin because of normal levels  - recent cath showed non-obstrucitve coronary disease, started on pravastatin, tolerating well   Past Medical History  Diagnosis Date  . Allergic rhinitis   . Obesity   . Essential hypertension, benign   . Diaphragmatic hernia without mention of obstruction or gangrene   . Anxiety   . Anemia   . Elevated liver function tests   . Diastolic congestive heart failure   . OSA on CPAP     "wears mask part of the time" (09/10/2013)  . Diabetes mellitus     "diet controlled" (09/10/2013)  . Arthritis     "all over my body" (09/10/2013)  . Osteoporosis   . Chronic lower back pain   . Depression   . BPH (benign prostatic hypertrophy)   . Sinus node dysfunction     a. 08/2013 s/p SJM DC PPM.  . CAD (coronary artery disease)     a. 08/2013 LM nl, LAD 61m, D1 80ost, 95p, LCX 59m, RCA 20d, EF 60%.  . Lacunar infarction     a. 08/2013 post-cath, MRI: sm acute lacunar infarcts in LPCA and LSCA, ? tiny RPICA lacunar infarct.     No Known Allergies   Current Outpatient Prescriptions  Medication Sig Dispense Refill  . acetaminophen (TYLENOL) 500 MG tablet Take 500 mg by mouth every 6 (six) hours as needed for pain.      Marland Kitchen  alendronate (FOSAMAX) 70 MG tablet Take 70 mg by mouth every 7 (seven) days. Take with a full glass of water on an empty stomach. On Mondays      . amLODipine (NORVASC) 10 MG tablet Take 1 tablet (10 mg total) by mouth daily.  90 tablet  1  . aspirin 81 MG tablet Take 81 mg by mouth daily.        . Calcium Carbonate-Vitamin D (CALCIUM 600 + D PO) Take 600 mg by mouth 2 (two) times daily.      . carvedilol (COREG) 25 MG tablet Take 1 tablet (25 mg total) by mouth 2 (two) times daily.  60 tablet  11  . enalapril (VASOTEC) 20 MG tablet Take 1 tablet (20 mg total) by mouth 2 (two) times daily.  60 tablet  6  . fish oil-omega-3 fatty acids 1000 MG capsule Take 1 g by mouth 2 (two) times daily.        . furosemide (LASIX) 40 MG tablet Take 1 tablet (40 mg total) by mouth daily.  90 tablet  1  . Multiple Vitamin (MULTIVITAMIN WITH MINERALS) TABS tablet Take 1 tablet by mouth daily.      . nitroGLYCERIN (  NITROSTAT) 0.4 MG SL tablet Place 1 tablet (0.4 mg total) under the tongue every 5 (five) minutes x 3 doses as needed for chest pain.  25 tablet  3  . NONFORMULARY OR COMPOUNDED ITEM Apply 1 application topically 2 (two) times daily. Diclo 3% / baclo 2% / cyclo 2% / tetracaine 2% compounded cream      . potassium chloride SA (K-DUR,KLOR-CON) 20 MEQ tablet Take 2 tablets (40 mEq total) by mouth daily.  30 tablet  6  . pravastatin (PRAVACHOL) 20 MG tablet Take 1 tablet (20 mg total) by mouth every evening.  90 tablet  3  . tamsulosin (FLOMAX) 0.4 MG CAPS capsule Take 1 capsule (0.4 mg total) by mouth 2 (two) times daily.  30 capsule  3  . traMADol (ULTRAM) 50 MG tablet Take 50 mg by mouth at bedtime.       No current facility-administered medications for this visit.     Past Surgical History  Procedure Laterality Date  . Cardiac catheterization  1998  . Cataract extraction w/ intraocular lens  implant, bilateral  2010  . Stomach surgery  03/02/2012    ?volvus; "stomach came loose and twisted; went up  under rib cage; had that corrected; tacked it up" (09/10/2013)     No Known Allergies    Family History  Problem Relation Age of Onset  . Hypertension Mother   . Hypertension Father      Social History Mr. Ripberger reports that he has never smoked. He has never used smokeless tobacco. Mr. Stanwood reports that he does not drink alcohol.   Review of Systems CONSTITUTIONAL: No weight loss, fever, chills, weakness or fatigue.  HEENT: Eyes: No visual loss, blurred vision, double vision or yellow sclerae.No hearing loss, sneezing, congestion, runny nose or sore throat.  SKIN: No rash or itching.  CARDIOVASCULAR: per HPI RESPIRATORY: No shortness of breath, cough or sputum.  GASTROINTESTINAL: No anorexia, nausea, vomiting or diarrhea. No abdominal pain or blood.  GENITOURINARY: No burning on urination, no polyuria NEUROLOGICAL: No headache, dizziness, syncope, paralysis, ataxia, numbness or tingling in the extremities. No change in bowel or bladder control.  MUSCULOSKELETAL: No muscle, back pain, joint pain or stiffness.  LYMPHATICS: No enlarged nodes. No history of splenectomy.  PSYCHIATRIC: No history of depression or anxiety.  ENDOCRINOLOGIC: No reports of sweating, cold or heat intolerance. No polyuria or polydipsia.  Marland Kitchen   Physical Examination p 60 bp 150/89 Wt 207 lbs BMI 28 Gen: resting comfortably, no acute distress HEENT: no scleral icterus, pupils equal round and reactive, no palptable cervical adenopathy,  CV: RRR, no m/r/g, no JVD Resp: Clear to auscultation bilaterally GI: abdomen is soft, non-tender, non-distended, normal bowel sounds, no hepatosplenomegaly MSK: extremities are warm, 1+ bilateral edema  Skin: warm, no rash Neuro:  no focal deficits Psych: appropriate affect   Diagnostic Studies Cardiac Catheterization 9.17.2014  Procedural Findings:  Hemodynamics  RA 3 mmHg  RV 37/1 mmHg  PA 33/12 mmHg  PCWP 6 mmHg  LV 201/4 mHg . LVEDP: 12 mmHg  AO 202/94  mmHg  Oxygen saturations:  PA 74%  AO 94%  Cardiac Output (Fick) 6.72  Cardiac Index (Fick) 3.22  Pulmonary vascular resistance (PVR): 1.9 Woods units.  Coronary angiography:  Coronary dominance: right  Left Main: Normal in size with no significant disease.  Left Anterior Descending (LAD): Large in size with mild diffuse atherosclerosis. There is diffuse 20% disease in the midsegment.  1st diagonal (D1): Normal in size with 80%  ostial stenosis followed by a 95% proximal stenosis.  2nd diagonal (D2): Medium in size with minor irregularities.  3rd diagonal (D3): Normal in size with no significant disease.  Circumflex (LCx): Normal in size and nondominant. There is 20% mid stenosis.  1st obtuse marginal: Medium in size with minor irregularities.  2nd obtuse marginal: Large is in size with no significant disease.  3rd obtuse marginal: Normal in size with minor irregularities.  Right Coronary Artery: large in size and dominant. There is 20% stenosis distally.  Posterior descending artery: normal in size with no significant disease.  Posterior AV segment: normal in size with no significant disease.  Posterolateral branchs: PL 1 is large with no significant disease. PL 2 is small. Left ventriculography: Left ventricular systolic function is normal , LVEF is estimated at 60 %, there is no significant mitral regurgitation   07/2012 Echo: mild LVH, LVEF 40-45%, LAE, mild MR,      Assessment and Plan   1. Systolic dysfunction  - appears to have normalized based on LV gram from cath, echo from last years showed mildly decreased function.  - evidence of mild volume overload, counseled to take extra lasix on days where edema or SOB worsens  2. CAD  - patent major arteries with evidence of disease in diags and OMs from recent cath  - continue risk factor modfications, started on low dose statin in the setting of known CAD   3. HTN  - at goal by recent bp log, continue current therapy.   4.  Bradycardia  -symptoms of dizziness and presyncope resolved with pacemaker placement  - device check 02/2014 with normal function.  - continue regular device clinic appointments  5. Cholesterol  - numbers are at goal, continue statin   F/u 6 months      Arnoldo Lenis, M.D., F.A.C.C.

## 2014-05-13 NOTE — Patient Instructions (Signed)
Your physician wants you to follow-up in: 6 months You will receive a reminder letter in the mail two months in advance. If you don't receive a letter, please call our office to schedule the follow-up appointment.     Your physician recommends that you continue on your current medications as directed. Please refer to the Current Medication list given to you today.      Thank you for choosing [facility] !        

## 2014-05-14 ENCOUNTER — Encounter (INDEPENDENT_AMBULATORY_CARE_PROVIDER_SITE_OTHER): Payer: Medicare Other | Admitting: Ophthalmology

## 2014-05-14 DIAGNOSIS — H43819 Vitreous degeneration, unspecified eye: Secondary | ICD-10-CM

## 2014-05-14 DIAGNOSIS — I10 Essential (primary) hypertension: Secondary | ICD-10-CM

## 2014-05-14 DIAGNOSIS — H348392 Tributary (branch) retinal vein occlusion, unspecified eye, stable: Secondary | ICD-10-CM

## 2014-05-14 DIAGNOSIS — H26499 Other secondary cataract, unspecified eye: Secondary | ICD-10-CM

## 2014-05-14 DIAGNOSIS — H35039 Hypertensive retinopathy, unspecified eye: Secondary | ICD-10-CM

## 2014-06-03 ENCOUNTER — Other Ambulatory Visit: Payer: Self-pay | Admitting: Cardiology

## 2014-06-04 ENCOUNTER — Other Ambulatory Visit (INDEPENDENT_AMBULATORY_CARE_PROVIDER_SITE_OTHER): Payer: Medicare Other | Admitting: Ophthalmology

## 2014-06-04 DIAGNOSIS — H26499 Other secondary cataract, unspecified eye: Secondary | ICD-10-CM

## 2014-06-09 ENCOUNTER — Ambulatory Visit (INDEPENDENT_AMBULATORY_CARE_PROVIDER_SITE_OTHER): Payer: Medicare Other | Admitting: *Deleted

## 2014-06-09 ENCOUNTER — Encounter: Payer: Self-pay | Admitting: Internal Medicine

## 2014-06-09 DIAGNOSIS — I495 Sick sinus syndrome: Secondary | ICD-10-CM

## 2014-06-09 LAB — MDC_IDC_ENUM_SESS_TYPE_REMOTE
Battery Remaining Longevity: 109 mo
Brady Statistic RA Percent Paced: 85 %
Implantable Pulse Generator Model: 2240
Implantable Pulse Generator Serial Number: 7536055
Lead Channel Impedance Value: 390 Ohm
Lead Channel Impedance Value: 440 Ohm
Lead Channel Pacing Threshold Amplitude: 0.625 V
Lead Channel Pacing Threshold Pulse Width: 0.5 ms
Lead Channel Sensing Intrinsic Amplitude: 4.3 mV
Lead Channel Sensing Intrinsic Amplitude: 6.3 mV
Lead Channel Setting Pacing Amplitude: 0.875
Lead Channel Setting Pacing Amplitude: 1.625
Lead Channel Setting Pacing Pulse Width: 0.5 ms
MDC IDC MSMT LEADCHNL RA PACING THRESHOLD AMPLITUDE: 0.625 V
MDC IDC MSMT LEADCHNL RA PACING THRESHOLD PULSEWIDTH: 0.5 ms
MDC IDC SET LEADCHNL RV SENSING SENSITIVITY: 2 mV
MDC IDC STAT BRADY RV PERCENT PACED: 96 %

## 2014-06-11 NOTE — Progress Notes (Signed)
Remote pacemaker transmission.   

## 2014-06-12 ENCOUNTER — Encounter (INDEPENDENT_AMBULATORY_CARE_PROVIDER_SITE_OTHER): Payer: Medicare Other | Admitting: Ophthalmology

## 2014-06-12 DIAGNOSIS — H348392 Tributary (branch) retinal vein occlusion, unspecified eye, stable: Secondary | ICD-10-CM

## 2014-06-12 DIAGNOSIS — H43819 Vitreous degeneration, unspecified eye: Secondary | ICD-10-CM | POA: Diagnosis not present

## 2014-06-12 DIAGNOSIS — H35039 Hypertensive retinopathy, unspecified eye: Secondary | ICD-10-CM

## 2014-06-12 DIAGNOSIS — I10 Essential (primary) hypertension: Secondary | ICD-10-CM | POA: Diagnosis not present

## 2014-06-17 ENCOUNTER — Encounter: Payer: Self-pay | Admitting: Cardiology

## 2014-07-10 ENCOUNTER — Encounter (INDEPENDENT_AMBULATORY_CARE_PROVIDER_SITE_OTHER): Payer: Medicare Other | Admitting: Ophthalmology

## 2014-07-10 DIAGNOSIS — H35039 Hypertensive retinopathy, unspecified eye: Secondary | ICD-10-CM | POA: Diagnosis not present

## 2014-07-10 DIAGNOSIS — I10 Essential (primary) hypertension: Secondary | ICD-10-CM | POA: Diagnosis not present

## 2014-07-10 DIAGNOSIS — H43819 Vitreous degeneration, unspecified eye: Secondary | ICD-10-CM | POA: Diagnosis not present

## 2014-07-10 DIAGNOSIS — H348392 Tributary (branch) retinal vein occlusion, unspecified eye, stable: Secondary | ICD-10-CM | POA: Diagnosis not present

## 2014-07-31 ENCOUNTER — Other Ambulatory Visit: Payer: Self-pay | Admitting: Cardiology

## 2014-08-07 ENCOUNTER — Encounter (INDEPENDENT_AMBULATORY_CARE_PROVIDER_SITE_OTHER): Payer: Medicare Other | Admitting: Ophthalmology

## 2014-08-07 DIAGNOSIS — I10 Essential (primary) hypertension: Secondary | ICD-10-CM

## 2014-08-07 DIAGNOSIS — H348392 Tributary (branch) retinal vein occlusion, unspecified eye, stable: Secondary | ICD-10-CM

## 2014-08-07 DIAGNOSIS — H43819 Vitreous degeneration, unspecified eye: Secondary | ICD-10-CM | POA: Diagnosis not present

## 2014-08-07 DIAGNOSIS — H35039 Hypertensive retinopathy, unspecified eye: Secondary | ICD-10-CM | POA: Diagnosis not present

## 2014-09-04 ENCOUNTER — Encounter (INDEPENDENT_AMBULATORY_CARE_PROVIDER_SITE_OTHER): Payer: Medicare Other | Admitting: Ophthalmology

## 2014-09-04 DIAGNOSIS — I10 Essential (primary) hypertension: Secondary | ICD-10-CM

## 2014-09-04 DIAGNOSIS — H35039 Hypertensive retinopathy, unspecified eye: Secondary | ICD-10-CM

## 2014-09-04 DIAGNOSIS — H348392 Tributary (branch) retinal vein occlusion, unspecified eye, stable: Secondary | ICD-10-CM

## 2014-09-04 DIAGNOSIS — H43819 Vitreous degeneration, unspecified eye: Secondary | ICD-10-CM

## 2014-09-11 ENCOUNTER — Encounter: Payer: Self-pay | Admitting: Internal Medicine

## 2014-09-11 ENCOUNTER — Ambulatory Visit (INDEPENDENT_AMBULATORY_CARE_PROVIDER_SITE_OTHER): Payer: Medicare Other | Admitting: Internal Medicine

## 2014-09-11 VITALS — BP 160/108 | HR 62 | Ht 73.0 in | Wt 209.0 lb

## 2014-09-11 DIAGNOSIS — Z950 Presence of cardiac pacemaker: Secondary | ICD-10-CM

## 2014-09-11 DIAGNOSIS — I495 Sick sinus syndrome: Secondary | ICD-10-CM

## 2014-09-11 DIAGNOSIS — I10 Essential (primary) hypertension: Secondary | ICD-10-CM

## 2014-09-11 LAB — MDC_IDC_ENUM_SESS_TYPE_INCLINIC
Battery Remaining Longevity: 106.8 mo
Date Time Interrogation Session: 20150917151549
Implantable Pulse Generator Model: 2240
Implantable Pulse Generator Serial Number: 7536055
Lead Channel Pacing Threshold Amplitude: 0.75 V
Lead Channel Pacing Threshold Amplitude: 0.875 V
Lead Channel Pacing Threshold Pulse Width: 0.5 ms
Lead Channel Pacing Threshold Pulse Width: 0.5 ms
Lead Channel Sensing Intrinsic Amplitude: 3.9 mV
Lead Channel Sensing Intrinsic Amplitude: 6.5 mV
Lead Channel Setting Pacing Pulse Width: 0.5 ms
Lead Channel Setting Sensing Sensitivity: 2 mV
MDC IDC MSMT BATTERY VOLTAGE: 2.99 V
MDC IDC MSMT LEADCHNL RA IMPEDANCE VALUE: 400 Ohm
MDC IDC MSMT LEADCHNL RV IMPEDANCE VALUE: 450 Ohm
MDC IDC SET LEADCHNL RA PACING AMPLITUDE: 1.875
MDC IDC SET LEADCHNL RV PACING AMPLITUDE: 1 V
MDC IDC STAT BRADY RA PERCENT PACED: 86 %
MDC IDC STAT BRADY RV PERCENT PACED: 97 %

## 2014-09-11 NOTE — Assessment & Plan Note (Signed)
His [employer] Jude DDD PM is working normally. Will recheck in several months. 

## 2014-09-11 NOTE — Patient Instructions (Signed)
Remote monitoring is used to monitor your Pacemaker of ICD from home. This monitoring reduces the number of office visits required to check your device to one time per year. It allows [place] to keep an eye on the functioning of your device to ensure it is working properly. You are scheduled for a device check from home on December 17th. You may send your transmission at any time that day. If you have a wireless device, the transmission will be sent automatically. After your physician reviews your transmission, you will receive a postcard with your next transmission date.  Your physician wants you to follow-up in: 1 year with Dr. Knox Saliva will receive a reminder letter in the mail two months in advance. If you don't receive a letter, please call our office to schedule the follow-up appointment.  Your physician recommends that you continue on your current medications as directed. Please refer to the Current Medication list given to you today.  Thank you for choosing [facility]!!

## 2014-09-11 NOTE — Progress Notes (Signed)
HPI [name] returns today for followup. He is a pleasant 80 yo man with a h/o symptomatic bradycardia, s/p PPM insertion, HTN, and peripheral edema. He has chronic back pain which limits his ability to ambulate. He denies dietary indiscretion.Marland Kitchen He notes that at home his blood pressure has been well controlled.  No Known Allergies   Current Outpatient Prescriptions  Medication Sig Dispense Refill  . acetaminophen (TYLENOL) 500 MG tablet Take 500 mg by mouth every 6 (six) hours as needed for pain.      Marland Kitchen alendronate (FOSAMAX) 70 MG tablet Take 70 mg by mouth every 7 (seven) days. Take with a full glass of water on an empty stomach. On Mondays      . amLODipine (NORVASC) 10 MG tablet TAKE 1 TABLET BY MOUTH EVERY DAY  90 tablet  3  . aspirin 81 MG tablet Take 81 mg by mouth daily.        . Calcium Carbonate-Vitamin D (CALCIUM 600 + D PO) Take 600 mg by mouth 2 (two) times daily.      . carvedilol (COREG) 25 MG tablet Take 1 tablet (25 mg total) by mouth 2 (two) times daily.  60 tablet  11  . enalapril (VASOTEC) 20 MG tablet Take 1 tablet (20 mg total) by mouth 2 (two) times daily.  60 tablet  6  . fish oil-omega-3 fatty acids 1000 MG capsule Take 1 g by mouth 2 (two) times daily.        . furosemide (LASIX) 40 MG tablet TAKE 1 TABLET BY MOUTH EVERY DAY  90 tablet  3  . Multiple Vitamin (MULTIVITAMIN WITH MINERALS) TABS tablet Take 1 tablet by mouth daily.      . nitroGLYCERIN (NITROSTAT) 0.4 MG SL tablet Place 1 tablet (0.4 mg total) under the tongue every 5 (five) minutes x 3 doses as needed for chest pain.  25 tablet  3  . NON FORMULARY Uses 3-4 times daily. 3% amantadine 3% carbamazepine 6% Gabapentin 0.5%Pentoxifylline 3% compounded preparation      . potassium chloride SA (K-DUR,KLOR-CON) 20 MEQ tablet Take 10 mEq by mouth daily.      . pravastatin (PRAVACHOL) 20 MG tablet TAKE 1 TABLET BY MOUTH EVERY EVENING  90 tablet  3  . tamsulosin (FLOMAX) 0.4 MG CAPS capsule Take 1 capsule  (0.4 mg total) by mouth 2 (two) times daily.  30 capsule  3  . traMADol (ULTRAM) 50 MG tablet Take 50 mg by mouth at bedtime.       No current facility-administered medications for this visit.     Past Medical History  Diagnosis Date  . Allergic rhinitis   . Obesity   . Essential hypertension, benign   . Diaphragmatic hernia without mention of obstruction or gangrene   . Anxiety   . Anemia   . Elevated liver function tests   . Diastolic congestive heart failure   . OSA on CPAP     "wears mask part of the time" (09/10/2013)  . Diabetes mellitus     "diet controlled" (09/10/2013)  . Arthritis     "all over my body" (09/10/2013)  . Osteoporosis   . Chronic lower back pain   . Depression   . BPH (benign prostatic hypertrophy)   . Sinus node dysfunction     a. 08/2013 s/p SJM DC PPM.  . CAD (coronary artery disease)     a. 08/2013 LM nl, LAD 27m, D1 80ost, 95p, LCX 55m,  RCA 20d, EF 60%.  . Lacunar infarction     a. 08/2013 post-cath, MRI: sm acute lacunar infarcts in LPCA and LSCA, ? tiny RPICA lacunar infarct.    ROS:   All systems reviewed and negative except as noted in the HPI.   Past Surgical History  Procedure Laterality Date  . Cardiac catheterization  1998  . Cataract extraction w/ intraocular lens  implant, bilateral  2010  . Stomach surgery  03/02/2012    ?volvus; "stomach came loose and twisted; went up under rib cage; had that corrected; tacked it up" (09/10/2013)     Family History  Problem Relation Age of Onset  . Hypertension Mother   . Hypertension Father      History   Social History  . Marital Status: Married    Spouse Name: N/A    Number of Children: 2  . Years of Education: N/A   Occupational History  . 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 Smoking status: Never Smoker   . Smokeless tobacco: Never Used  . Alcohol Use: No  . Drug Use: No  . Sexual Activity: No   Other Topics Concern  .  Not on file   Social History Narrative  . No narrative on file     BP 160/108  Pulse 62  Ht 6\' 1"  (1.854 m)  Wt 209 lb (94.802 kg)  BMI 27.58 kg/m2  SpO2 97%  Physical Exam:  Well appearing elderly man, NAD HEENT: Unremarkable Neck:  No JVD, no thyromegally Back:  No CVA tenderness Lungs:  Clear with no wheezes, rales, or rhonchi HEART:  Regular rate rhythm, no murmurs, no rubs, no clicks Abd:  soft, positive bowel sounds, no organomegally, no rebound, no guarding Ext:  2 plus pulses, no edema, no cyanosis, no clubbing Skin:  No rashes no nodules Neuro:  CN II through XII intact, motor grossly intact   DEVICE  Normal device function.  See PaceArt for details.   Assess/Plan:

## 2014-09-11 NOTE — Assessment & Plan Note (Signed)
His blood pressure is elevated today. He notes that at home it is well controlled.

## 2014-09-12 ENCOUNTER — Encounter: Payer: Self-pay | Admitting: Internal Medicine

## 2014-09-12 LAB — PACEMAKER DEVICE OBSERVATION

## 2014-10-02 ENCOUNTER — Encounter (INDEPENDENT_AMBULATORY_CARE_PROVIDER_SITE_OTHER): Payer: Medicare Other | Admitting: Ophthalmology

## 2014-10-02 DIAGNOSIS — I10 Essential (primary) hypertension: Secondary | ICD-10-CM

## 2014-10-02 DIAGNOSIS — H3531 Nonexudative age-related macular degeneration: Secondary | ICD-10-CM

## 2014-10-02 DIAGNOSIS — H35033 Hypertensive retinopathy, bilateral: Secondary | ICD-10-CM

## 2014-10-02 DIAGNOSIS — H34833 Tributary (branch) retinal vein occlusion, bilateral: Secondary | ICD-10-CM

## 2014-10-02 DIAGNOSIS — H43813 Vitreous degeneration, bilateral: Secondary | ICD-10-CM

## 2014-10-23 ENCOUNTER — Encounter (INDEPENDENT_AMBULATORY_CARE_PROVIDER_SITE_OTHER): Payer: Medicare Other | Admitting: Ophthalmology

## 2014-10-23 DIAGNOSIS — H34832 Tributary (branch) retinal vein occlusion, left eye: Secondary | ICD-10-CM

## 2014-10-29 ENCOUNTER — Other Ambulatory Visit: Payer: Self-pay | Admitting: Cardiology

## 2014-11-19 ENCOUNTER — Encounter: Payer: Self-pay | Admitting: Cardiology

## 2014-11-19 ENCOUNTER — Ambulatory Visit (INDEPENDENT_AMBULATORY_CARE_PROVIDER_SITE_OTHER): Payer: Medicare Other | Admitting: Cardiology

## 2014-11-19 VITALS — BP 158/80 | HR 80 | Ht 73.0 in | Wt 213.8 lb

## 2014-11-19 DIAGNOSIS — R001 Bradycardia, unspecified: Secondary | ICD-10-CM

## 2014-11-19 DIAGNOSIS — I519 Heart disease, unspecified: Secondary | ICD-10-CM

## 2014-11-19 DIAGNOSIS — I10 Essential (primary) hypertension: Secondary | ICD-10-CM

## 2014-11-19 MED ORDER — CARVEDILOL 25 MG PO TABS: ORAL_TABLET | ORAL | Status: DC

## 2014-11-19 NOTE — Patient Instructions (Signed)
Your physician wants you to follow-up in: 6 months with Dr. Bryna Colander will receive a reminder letter in the mail two months in advance. If you don't receive a letter, please call our office to schedule the follow-up appointment.  Your physician has recommended you make the following change in your medication:   TAKE CARVEDILOL 37.5 MG TWICE DAILY  Your physician has requested that you regularly monitor and record your blood pressure readings at home. Please use the same machine at the same time of day to check your readings and record them to bring to your follow-up visit.  Thank you for choosing [facility]!!

## 2014-11-19 NOTE — Progress Notes (Signed)
Clinical Summary Mr. Diehl is a 80 y.o.male seen today for follow up of the following medical problems.   1. Mild systolic dysfunction  - echo 07/2012 w/ LVEF 40-45%  - Cath 08/2013 w/ minimal CAD, LV gram 60% with normal filling pressures on RHC.  - reports low functional capacity due to back pain, can walk only a few feet before having to stop b/c of pain. - denies any orthopnea, PND, or LE edema - compliant w/ medications    2. HTN  - checks bp regularly, typically 130-150s/70-90s - compliant with bpmeds  3. Bradycardia  -s/p St Jude dual chamber pacemaker  - Followed in device clinic with normal recent check 08/2014  - denies any recent symptoms  4. Cholesterol  - compliant with statin, reports recent lipid panel with pcp.   Past Medical History  Diagnosis Date  . Allergic rhinitis   . Obesity   . Essential hypertension, benign   . Diaphragmatic hernia without mention of obstruction or gangrene   . Anxiety   . Anemia   . Elevated liver function tests   . Diastolic congestive heart failure   . OSA on CPAP     "wears mask part of the time" (09/10/2013)  . Diabetes mellitus     "diet controlled" (09/10/2013)  . Arthritis     "all over my body" (09/10/2013)  . Osteoporosis   . Chronic lower back pain   . Depression   . BPH (benign prostatic hypertrophy)   . Sinus node dysfunction     a. 08/2013 s/p SJM DC PPM.  . CAD (coronary artery disease)     a. 08/2013 LM nl, LAD 72m, D1 80ost, 95p, LCX 44m, RCA 20d, EF 60%.  . Lacunar infarction     a. 08/2013 post-cath, MRI: sm acute lacunar infarcts in LPCA and LSCA, ? tiny RPICA lacunar infarct.     No Known Allergies   Current Outpatient Prescriptions  Medication Sig Dispense Refill  . acetaminophen (TYLENOL) 500 MG tablet Take 500 mg by mouth every 6 (six) hours as needed for pain.    Marland Kitchen alendronate (FOSAMAX) 70 MG tablet Take 70 mg by mouth every 7 (seven) days. Take with a full glass of water on an empty  stomach. On Mondays    . amLODipine (NORVASC) 10 MG tablet TAKE 1 TABLET BY MOUTH EVERY DAY 90 tablet 3  . aspirin 81 MG tablet Take 81 mg by mouth daily.      . Calcium Carbonate-Vitamin D (CALCIUM 600 + D PO) Take 600 mg by mouth 2 (two) times daily.    . carvedilol (COREG) 25 MG tablet Take 1 tablet (25 mg total) by mouth 2 (two) times daily. 60 tablet 11  . enalapril (VASOTEC) 20 MG tablet TAKE 1 TABLET BY MOUTH 2 TIMES DAILY. 60 tablet 6  . fish oil-omega-3 fatty acids 1000 MG capsule Take 1 g by mouth 2 (two) times daily.      . furosemide (LASIX) 40 MG tablet TAKE 1 TABLET BY MOUTH EVERY DAY 90 tablet 3  . Multiple Vitamin (MULTIVITAMIN WITH MINERALS) TABS tablet Take 1 tablet by mouth daily.    . nitroGLYCERIN (NITROSTAT) 0.4 MG SL tablet Place 1 tablet (0.4 mg total) under the tongue every 5 (five) minutes x 3 doses as needed for chest pain. 25 tablet 3  . NON FORMULARY Uses 3-4 times daily. 3% amantadine 3% carbamazepine 6% Gabapentin 0.5%Pentoxifylline 3% compounded preparation    . potassium  chloride SA (K-DUR,KLOR-CON) 20 MEQ tablet Take 10 mEq by mouth daily.    . pravastatin (PRAVACHOL) 20 MG tablet TAKE 1 TABLET BY MOUTH EVERY EVENING 90 tablet 3  . tamsulosin (FLOMAX) 0.4 MG CAPS capsule Take 1 capsule (0.4 mg total) by mouth 2 (two) times daily. 30 capsule 3  . traMADol (ULTRAM) 50 MG tablet Take 50 mg by mouth at bedtime.     No current facility-administered medications for this visit.     Past Surgical History  Procedure Laterality Date  . Cardiac catheterization  1998  . Cataract extraction w/ intraocular lens  implant, bilateral  2010  . Stomach surgery  03/02/2012    ?volvus; "stomach came loose and twisted; went up under rib cage; had that corrected; tacked it up" (09/10/2013)     No Known Allergies    Family History  Problem Relation Age of Onset  . Hypertension Mother   . Hypertension Father      Social History Mr. Adelsberger reports that he has never  smoked. He has never used smokeless tobacco. Mr. Pryer reports that he does not drink alcohol.   Review of Systems CONSTITUTIONAL: No weight loss, fever, chills, weakness or fatigue.  HEENT: Eyes: No visual loss, blurred vision, double vision or yellow sclerae.No hearing loss, sneezing, congestion, runny nose or sore throat.  SKIN: No rash or itching.  CARDIOVASCULAR: per HPI RESPIRATORY: No shortness of breath, cough or sputum.  GASTROINTESTINAL: No anorexia, nausea, vomiting or diarrhea. No abdominal pain or blood.  GENITOURINARY: No burning on urination, no polyuria NEUROLOGICAL: No headache, dizziness, syncope, paralysis, ataxia, numbness or tingling in the extremities. No change in bowel or bladder control.  MUSCULOSKELETAL: + back pain LYMPHATICS: No enlarged nodes. No history of splenectomy.  PSYCHIATRIC: No history of depression or anxiety.  ENDOCRINOLOGIC: No reports of sweating, cold or heat intolerance. No polyuria or polydipsia.  Marland Kitchen   Physical Examination p 80 bp 158/80 Wt 213 lbs BMI 28 Gen: resting comfortably, no acute distress HEENT: no scleral icterus, pupils equal round and reactive, no palptable cervical adenopathy,  CV: RRR, no m/r/g, no JVD, no carotid bruits Resp: Clear to auscultation bilaterally GI: abdomen is soft, non-tender, non-distended, normal bowel sounds, no hepatosplenomegaly MSK: extremities are warm, no edema.  Skin: warm, no rash Neuro:  no focal deficits Psych: appropriate affect   Diagnostic Studies Cardiac Catheterization 9.17.2014  Procedural Findings:  Hemodynamics  RA 3 mmHg  RV 37/1 mmHg  PA 33/12 mmHg  PCWP 6 mmHg  LV 201/4 mHg . LVEDP: 12 mmHg  AO 202/94 mmHg  Oxygen saturations:  PA 74%  AO 94%  Cardiac Output (Fick) 6.72  Cardiac Index (Fick) 3.22  Pulmonary vascular resistance (PVR): 1.9 Woods units.  Coronary angiography:  Coronary dominance: right   Left Main: Normal in size with no significant  disease.   Left Anterior Descending (LAD): Large in size with mild diffuse atherosclerosis. There is diffuse 20% disease in the midsegment.   1st diagonal (D1): Normal in size with 80% ostial stenosis followed by a 95% proximal stenosis.   2nd diagonal (D2): Medium in size with minor irregularities.   3rd diagonal (D3): Normal in size with no significant disease.   Circumflex (LCx): Normal in size and nondominant. There is 20% mid stenosis.   1st obtuse marginal: Medium in size with minor irregularities.   2nd obtuse marginal: Large is in size with no significant disease.   3rd obtuse marginal: Normal in size with minor irregularities.  Right Coronary Artery: large in size and dominant. There is 20% stenosis distally.   Posterior descending artery: normal in size with no significant disease.   Posterior AV segment: normal in size with no significant disease.   Posterolateral branchs: PL 1 is large with no significant disease. PL 2 is small. Left ventriculography: Left ventricular systolic function is normal , LVEF is estimated at 60 %, there is no significant mitral regurgitation   07/2012 Echo: mild LVH, LVEF 40-45%, LAE, mild MR,         Assessment and Plan  1. Systolic dysfunction  - appears to have normalized based on LV gram from cath  - no current symptoms, appears euvolemic today. Continue current meds  2. CAD  - patent major arteries with evidence of disease in diags and OMs from recent cath. No current symptoms - continue risk factor modfications  3. HTN  - above goal, increase coreg to 37.5mg  bid.  4. Bradycardia  -symptoms of dizziness and presyncope resolved with pacemaker placement  - continue regular device clinic appointments  5. Cholesterol  - numbers are at goal, continue statin - request pcp recent labs  F/u 6 months   Arnoldo Lenis, M.D.

## 2014-12-04 ENCOUNTER — Encounter (HOSPITAL_COMMUNITY): Payer: Self-pay | Admitting: Cardiovascular Disease

## 2014-12-11 ENCOUNTER — Encounter: Payer: Self-pay | Admitting: Internal Medicine

## 2014-12-11 ENCOUNTER — Telehealth: Payer: Self-pay | Admitting: *Deleted

## 2014-12-11 ENCOUNTER — Ambulatory Visit (INDEPENDENT_AMBULATORY_CARE_PROVIDER_SITE_OTHER): Payer: Medicare Other | Admitting: *Deleted

## 2014-12-11 DIAGNOSIS — I495 Sick sinus syndrome: Secondary | ICD-10-CM

## 2014-12-11 NOTE — Progress Notes (Signed)
Remote pacemaker transmission.   

## 2014-12-11 NOTE — Telephone Encounter (Signed)
BP log received in Dr. Nelly Laurence folder

## 2014-12-12 LAB — MDC_IDC_ENUM_SESS_TYPE_REMOTE
Battery Remaining Longevity: 104 mo
Battery Remaining Percentage: 92 %
Battery Voltage: 2.99 V
Brady Statistic AP VS Percent: 1 %
Brady Statistic AS VS Percent: 1 %
Brady Statistic RA Percent Paced: 90 %
Brady Statistic RV Percent Paced: 99 %
Date Time Interrogation Session: 20151217095030
Implantable Pulse Generator Model: 2240
Lead Channel Impedance Value: 360 Ohm
Lead Channel Pacing Threshold Amplitude: 0.625 V
Lead Channel Pacing Threshold Amplitude: 1 V
Lead Channel Sensing Intrinsic Amplitude: 5.8 mV
Lead Channel Setting Pacing Amplitude: 0.875
Lead Channel Setting Sensing Sensitivity: 2 mV
MDC IDC MSMT LEADCHNL RA PACING THRESHOLD PULSEWIDTH: 0.5 ms
MDC IDC MSMT LEADCHNL RA SENSING INTR AMPL: 4.3 mV
MDC IDC MSMT LEADCHNL RV IMPEDANCE VALUE: 440 Ohm
MDC IDC MSMT LEADCHNL RV PACING THRESHOLD PULSEWIDTH: 0.5 ms
MDC IDC PG SERIAL: 7536055
MDC IDC SET LEADCHNL RA PACING AMPLITUDE: 2 V
MDC IDC SET LEADCHNL RV PACING PULSEWIDTH: 0.5 ms
MDC IDC STAT BRADY AP VP PERCENT: 90 %
MDC IDC STAT BRADY AS VP PERCENT: 9.6 %

## 2014-12-23 ENCOUNTER — Encounter (INDEPENDENT_AMBULATORY_CARE_PROVIDER_SITE_OTHER): Payer: Medicare Other | Admitting: Ophthalmology

## 2014-12-23 ENCOUNTER — Encounter: Payer: Self-pay | Admitting: Cardiology

## 2014-12-23 DIAGNOSIS — I10 Essential (primary) hypertension: Secondary | ICD-10-CM

## 2014-12-23 DIAGNOSIS — H3531 Nonexudative age-related macular degeneration: Secondary | ICD-10-CM

## 2014-12-23 DIAGNOSIS — H43813 Vitreous degeneration, bilateral: Secondary | ICD-10-CM

## 2014-12-23 DIAGNOSIS — H34832 Tributary (branch) retinal vein occlusion, left eye: Secondary | ICD-10-CM

## 2014-12-23 DIAGNOSIS — H35033 Hypertensive retinopathy, bilateral: Secondary | ICD-10-CM

## 2015-01-15 ENCOUNTER — Encounter: Payer: Self-pay | Admitting: Cardiology

## 2015-01-20 ENCOUNTER — Ambulatory Visit: Payer: Medicare Other | Admitting: Internal Medicine

## 2015-01-22 ENCOUNTER — Encounter (INDEPENDENT_AMBULATORY_CARE_PROVIDER_SITE_OTHER): Payer: Medicare Other | Admitting: Ophthalmology

## 2015-01-22 DIAGNOSIS — H34832 Tributary (branch) retinal vein occlusion, left eye: Secondary | ICD-10-CM

## 2015-01-22 DIAGNOSIS — I10 Essential (primary) hypertension: Secondary | ICD-10-CM

## 2015-01-22 DIAGNOSIS — H43813 Vitreous degeneration, bilateral: Secondary | ICD-10-CM

## 2015-01-22 DIAGNOSIS — H35033 Hypertensive retinopathy, bilateral: Secondary | ICD-10-CM

## 2015-02-18 ENCOUNTER — Other Ambulatory Visit: Payer: Self-pay | Admitting: Cardiology

## 2015-02-18 MED ORDER — NITROGLYCERIN 0.4 MG SL SUBL: 0.4000 mg | SUBLINGUAL_TABLET | SUBLINGUAL | Status: DC | PRN

## 2015-02-18 NOTE — Telephone Encounter (Signed)
Received fax refill request  Rx # [id number] Medication:  Nitrostat sub 0.4 mg 4X25 Qty 25 Sig:  Place 1 tablet under the tongue every 5 minutes from 3 doses as needed for chest pain  Physician:  Harl Bowie

## 2015-02-18 NOTE — Telephone Encounter (Signed)
escribed rx 

## 2015-02-19 ENCOUNTER — Encounter (INDEPENDENT_AMBULATORY_CARE_PROVIDER_SITE_OTHER): Payer: Medicare Other | Admitting: Ophthalmology

## 2015-02-19 DIAGNOSIS — I10 Essential (primary) hypertension: Secondary | ICD-10-CM

## 2015-02-19 DIAGNOSIS — H43813 Vitreous degeneration, bilateral: Secondary | ICD-10-CM | POA: Diagnosis not present

## 2015-02-19 DIAGNOSIS — H34832 Tributary (branch) retinal vein occlusion, left eye: Secondary | ICD-10-CM | POA: Diagnosis not present

## 2015-02-19 DIAGNOSIS — H35033 Hypertensive retinopathy, bilateral: Secondary | ICD-10-CM | POA: Diagnosis not present

## 2015-03-16 ENCOUNTER — Ambulatory Visit (INDEPENDENT_AMBULATORY_CARE_PROVIDER_SITE_OTHER): Payer: Medicare Other | Admitting: *Deleted

## 2015-03-16 DIAGNOSIS — I495 Sick sinus syndrome: Secondary | ICD-10-CM

## 2015-03-16 NOTE — Progress Notes (Signed)
Remote pacemaker transmission.   

## 2015-03-20 LAB — MDC_IDC_ENUM_SESS_TYPE_REMOTE
Battery Remaining Longevity: 110 mo
Battery Remaining Percentage: 95.5 %
Battery Voltage: 2.99 V
Brady Statistic AS VS Percent: 1 %
Date Time Interrogation Session: 20160321121127
Implantable Pulse Generator Model: 2240
Lead Channel Pacing Threshold Amplitude: 0.625 V
Lead Channel Sensing Intrinsic Amplitude: 7.2 mV
Lead Channel Setting Pacing Amplitude: 2 V
Lead Channel Setting Sensing Sensitivity: 2 mV
MDC IDC MSMT LEADCHNL RA IMPEDANCE VALUE: 380 Ohm
MDC IDC MSMT LEADCHNL RA PACING THRESHOLD AMPLITUDE: 1 V
MDC IDC MSMT LEADCHNL RA PACING THRESHOLD PULSEWIDTH: 0.5 ms
MDC IDC MSMT LEADCHNL RA SENSING INTR AMPL: 3.7 mV
MDC IDC MSMT LEADCHNL RV IMPEDANCE VALUE: 450 Ohm
MDC IDC MSMT LEADCHNL RV PACING THRESHOLD PULSEWIDTH: 0.5 ms
MDC IDC PG SERIAL: 7536055
MDC IDC SET LEADCHNL RV PACING AMPLITUDE: 0.875
MDC IDC SET LEADCHNL RV PACING PULSEWIDTH: 0.5 ms
MDC IDC STAT BRADY AP VP PERCENT: 91 %
MDC IDC STAT BRADY AP VS PERCENT: 1 %
MDC IDC STAT BRADY AS VP PERCENT: 8.4 %
MDC IDC STAT BRADY RA PERCENT PACED: 91 %
MDC IDC STAT BRADY RV PERCENT PACED: 99 %

## 2015-04-02 ENCOUNTER — Encounter (INDEPENDENT_AMBULATORY_CARE_PROVIDER_SITE_OTHER): Payer: Medicare Other | Admitting: Ophthalmology

## 2015-04-02 ENCOUNTER — Encounter: Payer: Self-pay | Admitting: Cardiology

## 2015-04-02 DIAGNOSIS — H43813 Vitreous degeneration, bilateral: Secondary | ICD-10-CM

## 2015-04-02 DIAGNOSIS — I10 Essential (primary) hypertension: Secondary | ICD-10-CM | POA: Diagnosis not present

## 2015-04-02 DIAGNOSIS — H34832 Tributary (branch) retinal vein occlusion, left eye: Secondary | ICD-10-CM | POA: Diagnosis not present

## 2015-04-02 DIAGNOSIS — H35033 Hypertensive retinopathy, bilateral: Secondary | ICD-10-CM

## 2015-04-06 ENCOUNTER — Encounter: Payer: Self-pay | Admitting: Internal Medicine

## 2015-05-14 ENCOUNTER — Encounter (INDEPENDENT_AMBULATORY_CARE_PROVIDER_SITE_OTHER): Payer: Medicare Other | Admitting: Ophthalmology

## 2015-05-14 DIAGNOSIS — H34832 Tributary (branch) retinal vein occlusion, left eye: Secondary | ICD-10-CM

## 2015-05-14 DIAGNOSIS — I10 Essential (primary) hypertension: Secondary | ICD-10-CM | POA: Diagnosis not present

## 2015-05-14 DIAGNOSIS — H35033 Hypertensive retinopathy, bilateral: Secondary | ICD-10-CM

## 2015-05-14 DIAGNOSIS — H43813 Vitreous degeneration, bilateral: Secondary | ICD-10-CM

## 2015-05-20 ENCOUNTER — Ambulatory Visit: Payer: Medicare Other | Admitting: Cardiology

## 2015-05-27 ENCOUNTER — Other Ambulatory Visit: Payer: Self-pay | Admitting: Cardiology

## 2015-05-27 ENCOUNTER — Other Ambulatory Visit: Payer: Self-pay | Admitting: Internal Medicine

## 2015-06-01 ENCOUNTER — Encounter: Payer: Self-pay | Admitting: Cardiology

## 2015-06-01 ENCOUNTER — Ambulatory Visit (INDEPENDENT_AMBULATORY_CARE_PROVIDER_SITE_OTHER): Payer: Medicare Other | Admitting: Cardiology

## 2015-06-01 VITALS — BP 122/66 | HR 62 | Ht 73.0 in | Wt 197.0 lb

## 2015-06-01 DIAGNOSIS — I2510 Atherosclerotic heart disease of native coronary artery without angina pectoris: Secondary | ICD-10-CM

## 2015-06-01 DIAGNOSIS — R001 Bradycardia, unspecified: Secondary | ICD-10-CM | POA: Diagnosis not present

## 2015-06-01 DIAGNOSIS — I10 Essential (primary) hypertension: Secondary | ICD-10-CM | POA: Diagnosis not present

## 2015-06-01 DIAGNOSIS — Z950 Presence of cardiac pacemaker: Secondary | ICD-10-CM | POA: Diagnosis not present

## 2015-06-01 DIAGNOSIS — I519 Heart disease, unspecified: Secondary | ICD-10-CM

## 2015-06-01 NOTE — Patient Instructions (Signed)
Your physician wants you to follow-up in: 6 months with Dr.Branch You will receive a reminder letter in the mail two months in advance. If you don't receive a letter, please call our office to schedule the follow-up appointment.   Your physician recommends that you continue on your current medications as directed. Please refer to the Current Medication list given to you today.    Thank you for choosing [facility] !        

## 2015-06-01 NOTE — Progress Notes (Signed)
Patient ID: [name], male   DOB: [birth year], 81 y.o.   MRN: [id number]     Clinical Summary [name] is a 81 y.o.male seen today for follow up of the following medical problems.   1. Mild systolic dysfunction  - echo 07/2012 w/ LVEF 40-45%  - Cath 08/2013 w/ minimal CAD, LV gram 60% with normal filling pressures on RHC.  - notes some SOB with short distances, he is also limited by chronic back pain and leg weakness - + LE edema  2. HTN  - checks bp regularly, typically 110/70s - compliant with bpmeds  3. Bradycardia  -s/p St Jude dual chamber pacemaker  - Followed in device clinic with normal recent check 02/2015 - denies any recent symptoms  4. Hyperlipidemia - compliant with statin, reports lipid check 09/2014 with his pcp Dr Woody Seller   Past Medical History  Diagnosis Date  . Allergic rhinitis   . Obesity   . Essential hypertension, benign   . Diaphragmatic hernia without mention of obstruction or gangrene   . Anxiety   . Anemia   . Elevated liver function tests   . Diastolic congestive heart failure   . OSA on CPAP     "wears mask part of the time" (09/10/2013)  . Diabetes mellitus     "diet controlled" (09/10/2013)  . Arthritis     "all over my body" (09/10/2013)  . Osteoporosis   . Chronic lower back pain   . Depression   . BPH (benign prostatic hypertrophy)   . Sinus node dysfunction     a. 08/2013 s/p SJM DC PPM.  . CAD (coronary artery disease)     a. 08/2013 LM nl, LAD 31m, D1 80ost, 95p, LCX 63m, RCA 20d, EF 60%.  . Lacunar infarction     a. 08/2013 post-cath, MRI: sm acute lacunar infarcts in LPCA and LSCA, ? tiny RPICA lacunar infarct.     No Known Allergies   Current Outpatient Prescriptions  Medication Sig Dispense Refill  . acetaminophen (TYLENOL) 500 MG tablet Take 500 mg by mouth every 6 (six) hours as needed for pain.    Marland Kitchen alendronate (FOSAMAX) 70 MG tablet Take 70 mg by mouth every 7 (seven) days. Take with a full glass of water on an  empty stomach. On Mondays    . amLODipine (NORVASC) 10 MG tablet TAKE 1 TABLET BY MOUTH EVERY DAY 90 tablet 3  . aspirin 81 MG tablet Take 81 mg by mouth daily.      . Calcium Carbonate-Vitamin D (CALCIUM 600 + D PO) Take 600 mg by mouth 2 (two) times daily.    . carvedilol (COREG) 25 MG tablet TAKE 1 & 1/2 TABLETS BY MOUTH TWICE DAILY 90 tablet 6  . enalapril (VASOTEC) 20 MG tablet TAKE 1 TABLET BY MOUTH 2 TIMES DAILY. 60 tablet 6  . fish oil-omega-3 fatty acids 1000 MG capsule Take 1 g by mouth 2 (two) times daily.      . furosemide (LASIX) 40 MG tablet TAKE 1 TABLET BY MOUTH EVERY DAY 90 tablet 3  . Multiple Vitamin (MULTIVITAMIN WITH MINERALS) TABS tablet Take 1 tablet by mouth daily.    . nitroGLYCERIN (NITROSTAT) 0.4 MG SL tablet Place 1 tablet (0.4 mg total) under the tongue every 5 (five) minutes x 3 doses as needed for chest pain. 25 tablet 3  . NON FORMULARY Uses 3-4 times daily. 3% amantadine 3% carbamazepine 6% Gabapentin 0.5%Pentoxifylline 3% compounded preparation    .  oxybutynin (DITROPAN) 5 MG tablet 5 mg daily.    . potassium chloride SA (K-DUR,KLOR-CON) 20 MEQ tablet Take 10 mEq by mouth daily.    . pravastatin (PRAVACHOL) 20 MG tablet TAKE 1 TABLET BY MOUTH EVERY EVENING 90 tablet 3  . tamsulosin (FLOMAX) 0.4 MG CAPS capsule Take 1 capsule (0.4 mg total) by mouth 2 (two) times daily. 30 capsule 3  . traMADol (ULTRAM) 50 MG tablet Take 50 mg by mouth at bedtime.     No current facility-administered medications for this visit.     Past Surgical History  Procedure Laterality Date  . Cardiac catheterization  1998  . Cataract extraction w/ intraocular lens  implant, bilateral  2010  . Stomach surgery  03/02/2012    ?volvus; "stomach came loose and twisted; went up under rib cage; had that corrected; tacked it up" (09/10/2013)  . Left and right heart catheterization with coronary angiogram N/A 09/11/2013    Procedure: LEFT AND RIGHT HEART CATHETERIZATION WITH CORONARY ANGIOGRAM;   Surgeon: Wellington Hampshire, MD;  Location: [facility];  Service: Cardiovascular;  Laterality: N/A;  . Permanent pacemaker insertion N/A 09/13/2013    Procedure: PERMANENT PACEMAKER INSERTION;  Surgeon: Evans Lance, MD;  Location: [facility];  Service: Cardiovascular;  Laterality: N/A;     No Known Allergies    Family History  Problem Relation Age of Onset  . Hypertension Mother   . Hypertension Father      Social History [name] reports that he has never smoked. He has never used smokeless tobacco. [name] reports that he does not drink alcohol.   Review of Systems CONSTITUTIONAL: No weight loss, fever, chills, weakness or fatigue.  HEENT: Eyes: No visual loss, blurred vision, double vision or yellow sclerae.No hearing loss, sneezing, congestion, runny nose or sore throat.  SKIN: No rash or itching.  CARDIOVASCULAR: per HPI RESPIRATORY: No shortness of breath, cough or sputum.  GASTROINTESTINAL: No anorexia, nausea, vomiting or diarrhea. No abdominal pain or blood.  GENITOURINARY: No burning on urination, no polyuria NEUROLOGICAL: No headache, dizziness, syncope, paralysis, ataxia, numbness or tingling in the extremities. No change in bowel or bladder control.  MUSCULOSKELETAL: No muscle, back pain, joint pain or stiffness.  LYMPHATICS: No enlarged nodes. No history of splenectomy.  PSYCHIATRIC: No history of depression or anxiety.  ENDOCRINOLOGIC: No reports of sweating, cold or heat intolerance. No polyuria or polydipsia.  Marland Kitchen   Physical Examination Filed Vitals:   06/01/15 1322  BP: 122/66  Pulse: 62   Filed Vitals:   06/01/15 1322  Height: 6\' 1"  (1.854 m)  Weight: 197 lb (89.359 kg)    Gen: resting comfortably, no acute distress HEENT: no scleral icterus, pupils equal round and reactive, no palptable cervical adenopathy,  CV: RRR, no m/r/g, no JVD Resp: Clear to auscultation bilaterally GI: abdomen is soft, non-tender, non-distended, normal bowel sounds,  no hepatosplenomegaly MSK: extremities are warm, 1+ bilateral LE edema Skin: warm, no rash Neuro:  no focal deficits Psych: appropriate affect   Diagnostic Studies Cardiac Catheterization 9.17.2014  Procedural Findings:  Hemodynamics  RA 3 mmHg  RV 37/1 mmHg  PA 33/12 mmHg  PCWP 6 mmHg  LV 201/4 mHg . LVEDP: 12 mmHg  AO 202/94 mmHg  Oxygen saturations:  PA 74%  AO 94%  Cardiac Output (Fick) 6.72  Cardiac Index (Fick) 3.22  Pulmonary vascular resistance (PVR): 1.9 Woods units.  Coronary angiography:  Coronary dominance: right   Left Main: Normal in size with  no significant disease.   Left Anterior Descending (LAD): Large in size with mild diffuse atherosclerosis. There is diffuse 20% disease in the midsegment.   1st diagonal (D1): Normal in size with 80% ostial stenosis followed by a 95% proximal stenosis.   2nd diagonal (D2): Medium in size with minor irregularities.   3rd diagonal (D3): Normal in size with no significant disease.   Circumflex (LCx): Normal in size and nondominant. There is 20% mid stenosis.   1st obtuse marginal: Medium in size with minor irregularities.   2nd obtuse marginal: Large is in size with no significant disease.   3rd obtuse marginal: Normal in size with minor irregularities.   Right Coronary Artery: large in size and dominant. There is 20% stenosis distally.   Posterior descending artery: normal in size with no significant disease.   Posterior AV segment: normal in size with no significant disease.   Posterolateral branchs: PL 1 is large with no significant disease. PL 2 is small. Left ventriculography: Left ventricular systolic function is normal , LVEF is estimated at 60 %, there is no significant mitral regurgitation   07/2012 Echo: mild LVH, LVEF 40-45%, LAE, mild MR,       Assessment and Plan  1. Systolic dysfunction  - appears to have normalized based on LV gram from cath  - stable  SOB/DOE, though fairly limited also by chronic back pain and leg weakness - some LE edema today, he is hesitant to increase his lasix due to frequent urination. Counseled ok to take extra lasix if significant swelling or SOB.   2. CAD  - patent major arteries with evidence of disease in diags and OMs from recent cath. No current symptoms - continue risk factor modfications  3. HTN  - at goal, continue current meds  4. Bradycardia  -symptoms of dizziness and presyncope resolved with pacemaker placement  - continue regular device clinic appointments  5. Hyperlipidemia - request pcp recent labs - continue current statin  F/u 6 months   Arnoldo Lenis, M.D.

## 2015-06-17 ENCOUNTER — Ambulatory Visit (INDEPENDENT_AMBULATORY_CARE_PROVIDER_SITE_OTHER): Payer: Medicare Other | Admitting: *Deleted

## 2015-06-17 DIAGNOSIS — I495 Sick sinus syndrome: Secondary | ICD-10-CM

## 2015-06-18 NOTE — Progress Notes (Signed)
Remote pacemaker transmission.   

## 2015-06-21 LAB — CUP PACEART REMOTE DEVICE CHECK
Battery Remaining Longevity: 112 mo
Battery Remaining Percentage: 95.5 %
Brady Statistic AP VP Percent: 93 %
Brady Statistic AP VS Percent: 1 %
Brady Statistic AS VP Percent: 7.2 %
Brady Statistic AS VS Percent: 1 %
Brady Statistic RA Percent Paced: 92 %
Date Time Interrogation Session: 20160622080017
Lead Channel Impedance Value: 360 Ohm
Lead Channel Impedance Value: 410 Ohm
Lead Channel Pacing Threshold Amplitude: 0.625 V
Lead Channel Pacing Threshold Amplitude: 1 V
Lead Channel Pacing Threshold Pulse Width: 0.5 ms
Lead Channel Sensing Intrinsic Amplitude: 5.6 mV
Lead Channel Setting Pacing Amplitude: 0.875
Lead Channel Setting Pacing Pulse Width: 0.5 ms
Lead Channel Setting Sensing Sensitivity: 2 mV
MDC IDC MSMT BATTERY VOLTAGE: 2.99 V
MDC IDC MSMT LEADCHNL RA SENSING INTR AMPL: 3.2 mV
MDC IDC MSMT LEADCHNL RV PACING THRESHOLD PULSEWIDTH: 0.5 ms
MDC IDC PG SERIAL: 7536055
MDC IDC SET LEADCHNL RA PACING AMPLITUDE: 2 V
MDC IDC STAT BRADY RV PERCENT PACED: 99 %

## 2015-06-25 ENCOUNTER — Encounter (INDEPENDENT_AMBULATORY_CARE_PROVIDER_SITE_OTHER): Payer: Medicare Other | Admitting: Ophthalmology

## 2015-06-25 DIAGNOSIS — I10 Essential (primary) hypertension: Secondary | ICD-10-CM

## 2015-06-25 DIAGNOSIS — H34832 Tributary (branch) retinal vein occlusion, left eye: Secondary | ICD-10-CM | POA: Diagnosis not present

## 2015-06-25 DIAGNOSIS — H35033 Hypertensive retinopathy, bilateral: Secondary | ICD-10-CM | POA: Diagnosis not present

## 2015-06-25 DIAGNOSIS — H43813 Vitreous degeneration, bilateral: Secondary | ICD-10-CM | POA: Diagnosis not present

## 2015-06-30 ENCOUNTER — Encounter: Payer: Self-pay | Admitting: Cardiology

## 2015-07-03 ENCOUNTER — Encounter: Payer: Self-pay | Admitting: Internal Medicine

## 2015-07-27 ENCOUNTER — Other Ambulatory Visit: Payer: Self-pay | Admitting: Cardiology

## 2015-08-12 ENCOUNTER — Encounter (INDEPENDENT_AMBULATORY_CARE_PROVIDER_SITE_OTHER): Payer: Medicare Other | Admitting: Ophthalmology

## 2015-08-12 DIAGNOSIS — I10 Essential (primary) hypertension: Secondary | ICD-10-CM | POA: Diagnosis not present

## 2015-08-12 DIAGNOSIS — H34832 Tributary (branch) retinal vein occlusion, left eye: Secondary | ICD-10-CM | POA: Diagnosis not present

## 2015-08-12 DIAGNOSIS — H35033 Hypertensive retinopathy, bilateral: Secondary | ICD-10-CM | POA: Diagnosis not present

## 2015-08-12 DIAGNOSIS — H43813 Vitreous degeneration, bilateral: Secondary | ICD-10-CM

## 2015-09-16 ENCOUNTER — Ambulatory Visit (INDEPENDENT_AMBULATORY_CARE_PROVIDER_SITE_OTHER): Payer: Medicare Other | Admitting: Internal Medicine

## 2015-09-16 ENCOUNTER — Encounter: Payer: Self-pay | Admitting: Internal Medicine

## 2015-09-16 VITALS — BP 112/70 | HR 61 | Ht 73.0 in | Wt 205.6 lb

## 2015-09-16 DIAGNOSIS — Z950 Presence of cardiac pacemaker: Secondary | ICD-10-CM

## 2015-09-16 DIAGNOSIS — I471 Supraventricular tachycardia: Secondary | ICD-10-CM

## 2015-09-16 DIAGNOSIS — I2510 Atherosclerotic heart disease of native coronary artery without angina pectoris: Secondary | ICD-10-CM

## 2015-09-16 DIAGNOSIS — I10 Essential (primary) hypertension: Secondary | ICD-10-CM | POA: Diagnosis not present

## 2015-09-16 DIAGNOSIS — I495 Sick sinus syndrome: Secondary | ICD-10-CM

## 2015-09-16 DIAGNOSIS — I2583 Coronary atherosclerosis due to lipid rich plaque: Secondary | ICD-10-CM

## 2015-09-16 LAB — CUP PACEART INCLINIC DEVICE CHECK
Battery Voltage: 2.99 V
Brady Statistic RV Percent Paced: 99.74 %
Lead Channel Impedance Value: 387.5 Ohm
Lead Channel Pacing Threshold Amplitude: 0.75 V
Lead Channel Pacing Threshold Amplitude: 0.75 V
Lead Channel Pacing Threshold Pulse Width: 0.5 ms
Lead Channel Pacing Threshold Pulse Width: 0.5 ms
Lead Channel Setting Pacing Pulse Width: 0.5 ms
MDC IDC MSMT BATTERY REMAINING LONGEVITY: 108 mo
MDC IDC MSMT LEADCHNL RV IMPEDANCE VALUE: 437.5 Ohm
MDC IDC SESS DTM: 20160921091211
MDC IDC SET LEADCHNL RA PACING AMPLITUDE: 2.125
MDC IDC SET LEADCHNL RV PACING AMPLITUDE: 1 V
MDC IDC SET LEADCHNL RV SENSING SENSITIVITY: 2 mV
MDC IDC STAT BRADY RA PERCENT PACED: 93 %
Pulse Gen Model: 2240
Pulse Gen Serial Number: 7536055

## 2015-09-16 NOTE — Assessment & Plan Note (Signed)
His [employer] Jude DDD PM is working normally. Will recheck in several months. 

## 2015-09-16 NOTE — Assessment & Plan Note (Signed)
PM interogation demonstrates both atrial tachy and one episode of atrial fib lasting 2 minutes. I discussed watchful waiting and the use of anti-coagulation. If he has more atrial fib, then we would strongly consider adding systemic anti-coagulation to his regimen.

## 2015-09-16 NOTE — Patient Instructions (Signed)
Your physician wants you to follow-up in: 12 months with Dr. Lovena Le.  You will receive a reminder letter in the mail two months in advance. If you don't receive a letter, please call our office to schedule the follow-up appointment.  Remote monitoring is used to monitor your Pacemaker of ICD from home. This monitoring reduces the number of office visits required to check your device to one time per year. It allows [place] to keep an eye on the functioning of your device to ensure it is working properly. You are scheduled for a device check from home on 12/16/15. You may send your transmission at any time that day. If you have a wireless device, the transmission will be sent automatically. After your physician reviews your transmission, you will receive a postcard with your next transmission date.  Your physician recommends that you continue on your current medications as directed. Please refer to the Current Medication list given to you today.  Thank you for choosing [facility]!

## 2015-09-16 NOTE — Progress Notes (Signed)
HPI [name] returns today for followup. He is a pleasant 81 yo man with a h/o symptomatic bradycardia, s/p PPM insertion, HTN, and peripheral edema. He has chronic back pain which limits his ability to ambulate. He denies dietary indiscretion.Marland Kitchen He notes that at home his blood pressure has been well controlled. He has rare, fleeting episodes on non-exertional chest pain. No Known Allergies   Current Outpatient Prescriptions  Medication Sig Dispense Refill  . acetaminophen (TYLENOL) 500 MG tablet Take 500 mg by mouth every 6 (six) hours as needed for pain.    Marland Kitchen alendronate (FOSAMAX) 70 MG tablet Take 70 mg by mouth every 7 (seven) days. Take with a full glass of water on an empty stomach. On Mondays    . amLODipine (NORVASC) 10 MG tablet TAKE 1 TABLET BY MOUTH EVERY DAY 90 tablet 3  . aspirin 81 MG tablet Take 81 mg by mouth daily.      . brimonidine (ALPHAGAN) 0.15 % ophthalmic solution     . Calcium Carbonate-Vitamin D (CALCIUM 600 + D PO) Take 600 mg by mouth 2 (two) times daily.    . carvedilol (COREG) 25 MG tablet TAKE 1 & 1/2 TABLETS BY MOUTH TWICE DAILY 90 tablet 6  . enalapril (VASOTEC) 20 MG tablet TAKE 1 TABLET BY MOUTH 2 TIMES DAILY. 60 tablet 6  . fish oil-omega-3 fatty acids 1000 MG capsule Take 1 g by mouth 2 (two) times daily.      . furosemide (LASIX) 40 MG tablet TAKE 1 TABLET BY MOUTH EVERY DAY 90 tablet 3  . Multiple Vitamin (MULTIVITAMIN WITH MINERALS) TABS tablet Take 1 tablet by mouth daily.    . nitroGLYCERIN (NITROSTAT) 0.4 MG SL tablet Place 1 tablet (0.4 mg total) under the tongue every 5 (five) minutes x 3 doses as needed for chest pain. 25 tablet 3  . NON FORMULARY Uses 3-4 times daily. 3% amantadine 3% carbamazepine 6% Gabapentin 0.5%Pentoxifylline 3% compounded preparation    . oxybutynin (DITROPAN) 5 MG tablet 5 mg daily.    . potassium chloride SA (K-DUR,KLOR-CON) 20 MEQ tablet Take 10 mEq by mouth daily.    . pravastatin (PRAVACHOL) 20 MG tablet TAKE 1  TABLET BY MOUTH EVERY EVENING 90 tablet 3  . tamsulosin (FLOMAX) 0.4 MG CAPS capsule Take 0.4 mg by mouth daily.     . traMADol (ULTRAM) 50 MG tablet Take 50 mg by mouth at bedtime.     No current facility-administered medications for this visit.     Past Medical History  Diagnosis Date  . Allergic rhinitis   . Obesity   . Essential hypertension, benign   . Diaphragmatic hernia without mention of obstruction or gangrene   . Anxiety   . Anemia   . Elevated liver function tests   . Diastolic congestive heart failure   . OSA on CPAP     "wears mask part of the time" (09/10/2013)  . Diabetes mellitus     "diet controlled" (09/10/2013)  . Arthritis     "all over my body" (09/10/2013)  . Osteoporosis   . Chronic lower back pain   . Depression   . BPH (benign prostatic hypertrophy)   . Sinus node dysfunction     a. 08/2013 s/p SJM DC PPM.  . CAD (coronary artery disease)     a. 08/2013 LM nl, LAD 44m, D1 80ost, 95p, LCX 51m, RCA 20d, EF 60%.  . Lacunar infarction     a. 08/2013  post-cath, MRI: sm acute lacunar infarcts in LPCA and LSCA, ? tiny RPICA lacunar infarct.    ROS:   All systems reviewed and negative except as noted in the HPI.   Past Surgical History  Procedure Laterality Date  . Cardiac catheterization  1998  . Cataract extraction w/ intraocular lens  implant, bilateral  2010  . Stomach surgery  03/02/2012    ?volvus; "stomach came loose and twisted; went up under rib cage; had that corrected; tacked it up" (09/10/2013)  . Left and right heart catheterization with coronary angiogram N/A 09/11/2013    Procedure: LEFT AND RIGHT HEART CATHETERIZATION WITH CORONARY ANGIOGRAM;  Surgeon: Wellington Hampshire, MD;  Location: [facility] CATH [facility];  Service: Cardiovascular;  Laterality: N/A;  . Permanent pacemaker insertion N/A 09/13/2013    Procedure: PERMANENT PACEMAKER INSERTION;  Surgeon: Evans Lance, MD;  Location: [facility] CATH LAB;  Service: Cardiovascular;  Laterality: N/A;     Family  History  Problem Relation Age of Onset  . Hypertension Mother   . Hypertension Father      Social History   Social History  . Marital Status: Married    Spouse Name: N/A  . Number of Children: 2  . Years of Education: N/A   Occupational History  . 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 Smoking status: Never Smoker   . Smokeless tobacco: Never Used  . Alcohol Use: No  . Drug Use: No  . Sexual Activity: No   Other Topics Concern  . Not on file   Social History Narrative     BP 112/70 mmHg  Pulse 61  Ht 6\' 1"  (1.854 m)  Wt 205 lb 9.6 oz (93.26 kg)  BMI 27.13 kg/m2  SpO2 95%  Physical Exam:  Well appearing elderly man, NAD HEENT: Unremarkable Neck:  6 cm JVD, no thyromegally Back:  No CVA tenderness Lungs:  Clear with no wheezes, rales, or rhonchi HEART:  Regular rate rhythm, no murmurs, no rubs, no clicks Abd:  soft, positive bowel sounds, no organomegally, no rebound, no guarding Ext:  2 plus pulses, no edema, no cyanosis, no clubbing Skin:  No rashes no nodules Neuro:  CN II through XII intact, motor grossly intact   DEVICE  Normal device function.  See PaceArt for details.   Assess/Plan:

## 2015-09-16 NOTE — Assessment & Plan Note (Signed)
His blood pressure is well controlled. Will follow. 

## 2015-09-16 NOTE — Assessment & Plan Note (Signed)
He denies anginal symptoms. He is sedentary. Will follow. No change in meds.

## 2015-09-23 ENCOUNTER — Encounter (INDEPENDENT_AMBULATORY_CARE_PROVIDER_SITE_OTHER): Payer: Medicare Other | Admitting: Ophthalmology

## 2015-09-23 DIAGNOSIS — H43813 Vitreous degeneration, bilateral: Secondary | ICD-10-CM | POA: Diagnosis not present

## 2015-09-23 DIAGNOSIS — H35033 Hypertensive retinopathy, bilateral: Secondary | ICD-10-CM

## 2015-09-23 DIAGNOSIS — I10 Essential (primary) hypertension: Secondary | ICD-10-CM

## 2015-09-23 DIAGNOSIS — H34832 Tributary (branch) retinal vein occlusion, left eye: Secondary | ICD-10-CM | POA: Diagnosis not present

## 2015-11-11 ENCOUNTER — Encounter (INDEPENDENT_AMBULATORY_CARE_PROVIDER_SITE_OTHER): Payer: Medicare Other | Admitting: Ophthalmology

## 2015-11-11 DIAGNOSIS — H43813 Vitreous degeneration, bilateral: Secondary | ICD-10-CM | POA: Diagnosis not present

## 2015-11-11 DIAGNOSIS — I10 Essential (primary) hypertension: Secondary | ICD-10-CM

## 2015-11-11 DIAGNOSIS — H34832 Tributary (branch) retinal vein occlusion, left eye, with macular edema: Secondary | ICD-10-CM

## 2015-11-11 DIAGNOSIS — H35033 Hypertensive retinopathy, bilateral: Secondary | ICD-10-CM | POA: Diagnosis not present

## 2015-12-01 ENCOUNTER — Ambulatory Visit (INDEPENDENT_AMBULATORY_CARE_PROVIDER_SITE_OTHER): Payer: Medicare Other | Admitting: Cardiology

## 2015-12-01 ENCOUNTER — Encounter: Payer: Self-pay | Admitting: Cardiology

## 2015-12-01 VITALS — BP 158/80 | HR 63 | Ht 73.0 in | Wt 205.0 lb

## 2015-12-01 DIAGNOSIS — R001 Bradycardia, unspecified: Secondary | ICD-10-CM

## 2015-12-01 DIAGNOSIS — I2510 Atherosclerotic heart disease of native coronary artery without angina pectoris: Secondary | ICD-10-CM

## 2015-12-01 DIAGNOSIS — I10 Essential (primary) hypertension: Secondary | ICD-10-CM

## 2015-12-01 DIAGNOSIS — I519 Heart disease, unspecified: Secondary | ICD-10-CM | POA: Diagnosis not present

## 2015-12-01 MED ORDER — FUROSEMIDE 20 MG PO TABS
60.0000 mg | ORAL_TABLET | Freq: Every day | ORAL | Status: DC
Start: 2015-12-01 — End: 2016-04-26

## 2015-12-01 NOTE — Progress Notes (Signed)
Patient ID: SCHON ETHEREDGE, male   DOB: May 14, 1933, 81 y.o.   MRN: [id number]     Clinical Summary Mr. Kritzer is a 81 y.o.male seen today for follow up of the following medical problems.   1. Mild systolic dysfunction  - echo 07/2012 w/ LVEF 40-45%  - Cath 08/2013 w/ minimal CAD, LV gram 60% with normal filling pressures on RHC.   - denies any SOB or DOE. Has had some LE edema in legs. Weight is up 8 lbs since last visit. Compliant with diuretic, limiting sodium intake.   2. HTN  - checks bp regularly, typically 110/70s - compliant with bpmeds   3. Bradycardia  -s/p [employer] dual chamber pacemaker  - Followed in device clinic with normal recent check 08/2015 - denies any recent symptoms  4. Hyperlipidemia - compliant with statin  5. Afib - from Dr Forde Dandy note patient had atach and isolate 2 min episode of afib on last monitor check - pt denies any palpitations  Past Medical History  Diagnosis Date  . Allergic rhinitis   . Obesity   . Essential hypertension, benign   . Diaphragmatic hernia without mention of obstruction or gangrene   . Anxiety   . Anemia   . Elevated liver function tests   . Diastolic congestive heart failure   . OSA on CPAP     "wears mask part of the time" (09/10/2013)  . Diabetes mellitus     "diet controlled" (09/10/2013)  . Arthritis     "all over my body" (09/10/2013)  . Osteoporosis   . Chronic lower back pain   . Depression   . BPH (benign prostatic hypertrophy)   . Sinus node dysfunction     a. 08/2013 s/p SJM DC PPM.  . CAD (coronary artery disease)     a. 08/2013 LM nl, LAD 41m, D1 80ost, 95p, LCX 60m, RCA 20d, EF 60%.  . Lacunar infarction     a. 08/2013 post-cath, MRI: sm acute lacunar infarcts in LPCA and LSCA, ? tiny RPICA lacunar infarct.     No Known Allergies   Current Outpatient Prescriptions  Medication Sig Dispense Refill  . acetaminophen (TYLENOL) 500 MG tablet Take 500 mg by mouth every 6 (six) hours as needed for pain.     Marland Kitchen alendronate (FOSAMAX) 70 MG tablet Take 70 mg by mouth every 7 (seven) days. Take with a full glass of water on an empty stomach. On Mondays    . amLODipine (NORVASC) 10 MG tablet TAKE 1 TABLET BY MOUTH EVERY DAY 90 tablet 3  . aspirin 81 MG tablet Take 81 mg by mouth daily.      . brimonidine (ALPHAGAN) 0.15 % ophthalmic solution     . Calcium Carbonate-Vitamin D (CALCIUM 600 + D PO) Take 600 mg by mouth 2 (two) times daily.    . carvedilol (COREG) 25 MG tablet TAKE 1 & 1/2 TABLETS BY MOUTH TWICE DAILY 90 tablet 6  . enalapril (VASOTEC) 20 MG tablet TAKE 1 TABLET BY MOUTH 2 TIMES DAILY. 60 tablet 6  . fish oil-omega-3 fatty acids 1000 MG capsule Take 1 g by mouth 2 (two) times daily.      . furosemide (LASIX) 40 MG tablet TAKE 1 TABLET BY MOUTH EVERY DAY 90 tablet 3  . Multiple Vitamin (MULTIVITAMIN WITH MINERALS) TABS tablet Take 1 tablet by mouth daily.    . nitroGLYCERIN (NITROSTAT) 0.4 MG SL tablet Place 1 tablet (0.4 mg total) under the tongue every  5 (five) minutes x 3 doses as needed for chest pain. 25 tablet 3  . NON FORMULARY Uses 3-4 times daily. 3% amantadine 3% carbamazepine 6% Gabapentin 0.5%Pentoxifylline 3% compounded preparation    . oxybutynin (DITROPAN) 5 MG tablet 5 mg daily.    . potassium chloride SA (K-DUR,KLOR-CON) 20 MEQ tablet Take 10 mEq by mouth daily.    . pravastatin (PRAVACHOL) 20 MG tablet TAKE 1 TABLET BY MOUTH EVERY EVENING 90 tablet 3  . tamsulosin (FLOMAX) 0.4 MG CAPS capsule Take 0.4 mg by mouth daily.     . traMADol (ULTRAM) 50 MG tablet Take 50 mg by mouth at bedtime.     No current facility-administered medications for this visit.     Past Surgical History  Procedure Laterality Date  . Cardiac catheterization  1998  . Cataract extraction w/ intraocular lens  implant, bilateral  2010  . Stomach surgery  03/02/2012    ?volvus; "stomach came loose and twisted; went up under rib cage; had that corrected; tacked it up" (09/10/2013)  . Left and right  heart catheterization with coronary angiogram N/A 10-07-2013    Procedure: LEFT AND RIGHT HEART CATHETERIZATION WITH CORONARY ANGIOGRAM;  Surgeon: Wellington Hampshire, MD;  Location: [facility] CATH LAB;  Service: Cardiovascular;  Laterality: N/A;  . Permanent pacemaker insertion N/A 09/13/2013    Procedure: PERMANENT PACEMAKER INSERTION;  Surgeon: Evans Lance, MD;  Location: [facility] CATH LAB;  Service: Cardiovascular;  Laterality: N/A;     No Known Allergies    Family History  Problem Relation Age of Onset  . Hypertension Mother   . Hypertension Father      Social History Mr. Ayles reports that he has never smoked. He has never used smokeless tobacco. Mr. Troccoli reports that he does not drink alcohol.   Review of Systems CONSTITUTIONAL: No weight loss, fever, chills, weakness or fatigue.  HEENT: Eyes: No visual loss, blurred vision, double vision or yellow sclerae.No hearing loss, sneezing, congestion, runny nose or sore throat.  SKIN: No rash or itching.  CARDIOVASCULAR: no chest pain , no palpitations RESPIRATORY: No shortness of breath, cough or sputum.  GASTROINTESTINAL: No anorexia, nausea, vomiting or diarrhea. No abdominal pain or blood.  GENITOURINARY: No burning on urination, no polyuria NEUROLOGICAL: No headache, dizziness, syncope, paralysis, ataxia, numbness or tingling in the extremities. No change in bowel or bladder control.  MUSCULOSKELETAL: No muscle, back pain, joint pain or stiffness.  LYMPHATICS: No enlarged nodes. No history of splenectomy.  PSYCHIATRIC: No history of depression or anxiety.  ENDOCRINOLOGIC: No reports of sweating, cold or heat intolerance. No polyuria or polydipsia.  Marland Kitchen   Physical Examination Filed Vitals:   12/01/15 1050  BP: 158/80  Pulse: 63   Filed Vitals:   12/01/15 1050  Height: 6\' 1"  (1.854 m)  Weight: 205 lb (92.987 kg)    Gen: resting comfortably, no acute distress HEENT: no scleral icterus, pupils equal round and reactive, no  palptable cervical adenopathy,  CV: RRR,no m/r/g, no jvd Resp: Clear to auscultation bilaterally GI: abdomen is soft, non-tender, non-distended, normal bowel sounds, no hepatosplenomegaly MSK: extremities are warm, no edema.  Skin: warm, no rash Neuro:  no focal deficits Psych: appropriate affect   Diagnostic Studies  Cardiac Catheterization 10-07-2013  Procedural Findings:  Hemodynamics  RA 3 mmHg  RV 37/1 mmHg  PA 33/12 mmHg  PCWP 6 mmHg  LV 201/4 mHg . LVEDP: 12 mmHg  AO 202/94 mmHg  Oxygen saturations:  PA 74%  AO 94%  Cardiac Output (Fick) 6.72  Cardiac Index (Fick) 3.22  Pulmonary vascular resistance (PVR): 1.9 Woods units.  Coronary angiography:  Coronary dominance: right   Left Main: Normal in size with no significant disease.   Left Anterior Descending (LAD): Large in size with mild diffuse atherosclerosis. There is diffuse 20% disease in the midsegment.   1st diagonal (D1): Normal in size with 80% ostial stenosis followed by a 95% proximal stenosis.   2nd diagonal (D2): Medium in size with minor irregularities.   3rd diagonal (D3): Normal in size with no significant disease.   Circumflex (LCx): Normal in size and nondominant. There is 20% mid stenosis.   1st obtuse marginal: Medium in size with minor irregularities.   2nd obtuse marginal: Large is in size with no significant disease.   3rd obtuse marginal: Normal in size with minor irregularities.   Right Coronary Artery: large in size and dominant. There is 20% stenosis distally.   Posterior descending artery: normal in size with no significant disease.   Posterior AV segment: normal in size with no significant disease.   Posterolateral branchs: PL 1 is large with no significant disease. PL 2 is small. Left ventriculography: Left ventricular systolic function is normal , LVEF is estimated at 60 %, there is no significant mitral regurgitation   07/2012 Echo: mild LVH, LVEF  40-45%, LAE, mild MR,    Assessment and Plan   1. Systolic dysfunction  - appears to have normalized based on LV gram from cath  - stable SOB/DOE, though fairly limited also by chronic back pain and leg weakness - some LE edema today,will increase lasix to 60mg .   2. CAD  - patent major arteries with evidence of disease in diags and OMs from recent cath. No current symptoms  3. HTN  - elevated, however patient with history of orthostatic symptoms Will not further titrate meds  4. Bradycardia  - no symptoms - continue regular device clinic appointments  5. Hyperlipidemia - request pcp recent labs - continue current statin  6. Afib - short episode on last device check. Will continue to monitor, if recurrent will need consideration for anticoagulation.   F/u 6 months      Arnoldo Lenis, M.D.

## 2015-12-01 NOTE — Patient Instructions (Signed)
Medication Instructions:  INCREASE LASIX to 60 mg daily  Labwork: I will request lab work form your pcp  Testing/Procedures: none  Follow-Up: Your physician wants you to follow-up in: 6 months with Dr. Harl Bowie.  You will receive a reminder letter in the mail two months in advance. If you don't receive a letter, please call our office to schedule the follow-up appointment.   Any Other Special Instructions Will Be Listed Below (If Applicable).     If you need a refill on your cardiac medications before your next appointment, please call your pharmacy.

## 2015-12-16 ENCOUNTER — Ambulatory Visit (INDEPENDENT_AMBULATORY_CARE_PROVIDER_SITE_OTHER): Payer: Medicare Other | Admitting: *Deleted

## 2015-12-16 DIAGNOSIS — I495 Sick sinus syndrome: Secondary | ICD-10-CM | POA: Diagnosis not present

## 2015-12-16 NOTE — Progress Notes (Signed)
Remote pacemaker transmission.   

## 2015-12-22 LAB — CUP PACEART REMOTE DEVICE CHECK
Battery Voltage: 2.99 V
Brady Statistic AP VP Percent: 94 %
Brady Statistic AS VP Percent: 6.1 %
Brady Statistic RV Percent Paced: 99 %
Implantable Lead Implant Date: 20140919
Implantable Lead Implant Date: 20140919
Implantable Lead Location: 753860
Lead Channel Pacing Threshold Pulse Width: 0.4 ms
Lead Channel Setting Pacing Amplitude: 0.875
Lead Channel Setting Pacing Amplitude: 2.125
Lead Channel Setting Pacing Pulse Width: 0.5 ms
MDC IDC LEAD LOCATION: 753859
MDC IDC MSMT BATTERY REMAINING LONGEVITY: 111 mo
MDC IDC MSMT BATTERY REMAINING PERCENTAGE: 95.5 %
MDC IDC MSMT LEADCHNL RV PACING THRESHOLD AMPLITUDE: 0.75 V
MDC IDC PG SERIAL: 7536055
MDC IDC SESS DTM: 20161221103526
MDC IDC SET LEADCHNL RV SENSING SENSITIVITY: 2 mV
MDC IDC STAT BRADY AP VS PERCENT: 1 %
MDC IDC STAT BRADY AS VS PERCENT: 1 %
MDC IDC STAT BRADY RA PERCENT PACED: 94 %
Pulse Gen Model: 2240

## 2015-12-24 ENCOUNTER — Encounter: Payer: Self-pay | Admitting: Cardiology

## 2015-12-24 ENCOUNTER — Other Ambulatory Visit: Payer: Self-pay

## 2015-12-24 MED ORDER — CARVEDILOL 25 MG PO TABS: ORAL_TABLET | ORAL | Status: DC

## 2015-12-24 NOTE — Telephone Encounter (Signed)
Refill coreg complete 

## 2015-12-25 ENCOUNTER — Encounter: Payer: Self-pay | Admitting: Cardiovascular Disease

## 2016-01-12 DIAGNOSIS — R32 Unspecified urinary incontinence: Secondary | ICD-10-CM | POA: Diagnosis not present

## 2016-01-12 DIAGNOSIS — I10 Essential (primary) hypertension: Secondary | ICD-10-CM | POA: Diagnosis not present

## 2016-01-12 DIAGNOSIS — I429 Cardiomyopathy, unspecified: Secondary | ICD-10-CM | POA: Diagnosis not present

## 2016-01-12 DIAGNOSIS — I2510 Atherosclerotic heart disease of native coronary artery without angina pectoris: Secondary | ICD-10-CM | POA: Diagnosis not present

## 2016-01-13 ENCOUNTER — Encounter (INDEPENDENT_AMBULATORY_CARE_PROVIDER_SITE_OTHER): Payer: Medicare Other | Admitting: Ophthalmology

## 2016-01-13 DIAGNOSIS — H40052 Ocular hypertension, left eye: Secondary | ICD-10-CM | POA: Diagnosis not present

## 2016-01-13 DIAGNOSIS — H34832 Tributary (branch) retinal vein occlusion, left eye, with macular edema: Secondary | ICD-10-CM | POA: Diagnosis not present

## 2016-01-13 DIAGNOSIS — H35033 Hypertensive retinopathy, bilateral: Secondary | ICD-10-CM

## 2016-01-13 DIAGNOSIS — I10 Essential (primary) hypertension: Secondary | ICD-10-CM | POA: Diagnosis not present

## 2016-01-13 DIAGNOSIS — H43813 Vitreous degeneration, bilateral: Secondary | ICD-10-CM | POA: Diagnosis not present

## 2016-01-19 DIAGNOSIS — H40053 Ocular hypertension, bilateral: Secondary | ICD-10-CM | POA: Diagnosis not present

## 2016-01-22 ENCOUNTER — Other Ambulatory Visit: Payer: Self-pay

## 2016-01-22 MED ORDER — ENALAPRIL MALEATE 20 MG PO TABS: ORAL_TABLET | ORAL | Status: DC

## 2016-01-22 NOTE — Telephone Encounter (Signed)
Refilled rx

## 2016-03-08 DIAGNOSIS — M5126 Other intervertebral disc displacement, lumbar region: Secondary | ICD-10-CM | POA: Diagnosis not present

## 2016-03-15 DIAGNOSIS — E1165 Type 2 diabetes mellitus with hyperglycemia: Secondary | ICD-10-CM | POA: Diagnosis not present

## 2016-03-15 DIAGNOSIS — I2510 Atherosclerotic heart disease of native coronary artery without angina pectoris: Secondary | ICD-10-CM | POA: Diagnosis not present

## 2016-03-15 DIAGNOSIS — I10 Essential (primary) hypertension: Secondary | ICD-10-CM | POA: Diagnosis not present

## 2016-03-16 ENCOUNTER — Ambulatory Visit (INDEPENDENT_AMBULATORY_CARE_PROVIDER_SITE_OTHER): Payer: Medicare Other | Admitting: *Deleted

## 2016-03-16 DIAGNOSIS — I495 Sick sinus syndrome: Secondary | ICD-10-CM | POA: Diagnosis not present

## 2016-03-16 NOTE — Progress Notes (Signed)
Remote pacemaker transmission.   

## 2016-03-23 ENCOUNTER — Encounter (INDEPENDENT_AMBULATORY_CARE_PROVIDER_SITE_OTHER): Payer: Medicare Other | Admitting: Ophthalmology

## 2016-03-23 DIAGNOSIS — H43813 Vitreous degeneration, bilateral: Secondary | ICD-10-CM

## 2016-03-23 DIAGNOSIS — H40052 Ocular hypertension, left eye: Secondary | ICD-10-CM

## 2016-03-23 DIAGNOSIS — H35033 Hypertensive retinopathy, bilateral: Secondary | ICD-10-CM

## 2016-03-23 DIAGNOSIS — H34832 Tributary (branch) retinal vein occlusion, left eye, with macular edema: Secondary | ICD-10-CM

## 2016-03-23 DIAGNOSIS — I10 Essential (primary) hypertension: Secondary | ICD-10-CM | POA: Diagnosis not present

## 2016-04-14 DIAGNOSIS — I2510 Atherosclerotic heart disease of native coronary artery without angina pectoris: Secondary | ICD-10-CM | POA: Diagnosis not present

## 2016-04-14 DIAGNOSIS — E7800 Pure hypercholesterolemia, unspecified: Secondary | ICD-10-CM | POA: Diagnosis not present

## 2016-04-14 DIAGNOSIS — I10 Essential (primary) hypertension: Secondary | ICD-10-CM | POA: Diagnosis not present

## 2016-04-14 DIAGNOSIS — M159 Polyosteoarthritis, unspecified: Secondary | ICD-10-CM | POA: Diagnosis not present

## 2016-04-15 ENCOUNTER — Encounter: Payer: Self-pay | Admitting: Cardiology

## 2016-04-15 LAB — CUP PACEART REMOTE DEVICE CHECK
Battery Remaining Longevity: 110 mo
Battery Remaining Percentage: 95.5 %
Battery Voltage: 2.98 V
Brady Statistic RA Percent Paced: 94 %
Brady Statistic RV Percent Paced: 99 %
Date Time Interrogation Session: 20170322071548
Implantable Lead Implant Date: 20140919
Implantable Lead Location: 753860
Lead Channel Impedance Value: 360 Ohm
Lead Channel Pacing Threshold Amplitude: 1 V
Lead Channel Pacing Threshold Pulse Width: 0.5 ms
Lead Channel Setting Pacing Amplitude: 2 V
Lead Channel Setting Sensing Sensitivity: 2 mV
MDC IDC LEAD IMPLANT DT: 20140919
MDC IDC LEAD LOCATION: 753859
MDC IDC MSMT LEADCHNL RA SENSING INTR AMPL: 4.1 mV
MDC IDC MSMT LEADCHNL RV IMPEDANCE VALUE: 400 Ohm
MDC IDC MSMT LEADCHNL RV PACING THRESHOLD AMPLITUDE: 0.875 V
MDC IDC MSMT LEADCHNL RV PACING THRESHOLD PULSEWIDTH: 0.5 ms
MDC IDC MSMT LEADCHNL RV SENSING INTR AMPL: 5.6 mV
MDC IDC PG SERIAL: 7536055
MDC IDC SET LEADCHNL RV PACING AMPLITUDE: 1.125
MDC IDC SET LEADCHNL RV PACING PULSEWIDTH: 0.5 ms
MDC IDC STAT BRADY AP VP PERCENT: 94 %
MDC IDC STAT BRADY AP VS PERCENT: 1 %
MDC IDC STAT BRADY AS VP PERCENT: 5.5 %
MDC IDC STAT BRADY AS VS PERCENT: 1 %

## 2016-04-26 ENCOUNTER — Other Ambulatory Visit: Payer: Self-pay

## 2016-04-26 MED ORDER — FUROSEMIDE 20 MG PO TABS: 60.0000 mg | ORAL_TABLET | Freq: Every day | ORAL | Status: DC

## 2016-04-27 ENCOUNTER — Telehealth: Payer: Self-pay | Admitting: *Deleted

## 2016-04-27 MED ORDER — APIXABAN 5 MG PO TABS: 5.0000 mg | ORAL_TABLET | Freq: Two times a day (BID) | ORAL | Status: DC

## 2016-04-27 NOTE — Telephone Encounter (Signed)
Informed pt and wife that Dr.Taylor recommends that he start taking Eliquis 5mg  BID. I explained to them both what the medication is used for and how often he should take it in a day. Wife and patient voiced understanding of all information given and asked that the Rx be sent to [facility].   I encouraged them to call if they have any further questions. Again, wife voiced understanding.

## 2016-04-27 NOTE — Telephone Encounter (Signed)
-----   Message from Evans Lance, MD sent at 04/18/2016 10:02 AM EDT ----- Remote device check reviewed. Histograms appropriate. Leads and battery stable for patient. He will need to start a NOAC.  Follow up as outlined above. Please ask the patient to start Eliquis 5 mg bid.

## 2016-04-28 NOTE — Telephone Encounter (Signed)
Wife called back asking if it was ok for patient to continue using his Aspercream. I told her that there is probably not an interaction between this and the Eliquis, but I advised her to call the patient's PCP to make sure.   Wife voiced understanding and plans to call.

## 2016-04-28 NOTE — Addendum Note (Signed)
Addended by: Shiela Mayer on: 04/28/2016 08:58 AM   Modules accepted: Orders, Medications

## 2016-04-28 NOTE — Telephone Encounter (Signed)
Informed patient's wife that Dr.Taylor recommends that he discontinue his ASA since he's starting Eliquis. Wife voiced understanding.

## 2016-04-29 ENCOUNTER — Telehealth: Payer: Self-pay | Admitting: Internal Medicine

## 2016-04-29 NOTE — Telephone Encounter (Signed)
Will defer to Dr. Lovena Le for recommendation.

## 2016-04-29 NOTE — Telephone Encounter (Signed)
Spoke with pt wife, the pt has a leaking blood vessel in his eye that has been there for 2 years. The pt was given eliquis yesterday. They are calling to make sure it is okay for the pt to take. The wife reports he goes every month to 6 weeks to get an injection in his eye with dr Zigmund Daniel. When he last saw the pt, dr Zigmund Daniel reported to them the vessel was still leaking. Will forward for pharm md review and advise

## 2016-04-29 NOTE — Telephone Encounter (Signed)
Pt's wife called to report:  Blood vesal bleeding behind left eye for the pass 2 years - Dr. Rodena Piety eye specialist.     She has questions about pt's medications with this going on.

## 2016-05-02 ENCOUNTER — Encounter: Payer: Self-pay | Admitting: Cardiology

## 2016-05-02 NOTE — Telephone Encounter (Signed)
Patient calling back regarding leaking blood vessel in eye. He has not started taking his Eliquis yet and wants to be sure that he can start it. Pt does have an eye appt this Wednesday and asked him to discuss with optometrist. Will reroute to Dr. Lovena Le and Claiborne Billings.

## 2016-05-03 DIAGNOSIS — I2510 Atherosclerotic heart disease of native coronary artery without angina pectoris: Secondary | ICD-10-CM | POA: Diagnosis not present

## 2016-05-03 DIAGNOSIS — E7800 Pure hypercholesterolemia, unspecified: Secondary | ICD-10-CM | POA: Diagnosis not present

## 2016-05-03 DIAGNOSIS — M159 Polyosteoarthritis, unspecified: Secondary | ICD-10-CM | POA: Diagnosis not present

## 2016-05-03 DIAGNOSIS — I10 Essential (primary) hypertension: Secondary | ICD-10-CM | POA: Diagnosis not present

## 2016-05-03 NOTE — Telephone Encounter (Addendum)
[name], Our mutual patient [name] has PAF and needs to be on systemic anti-coagulation. We were going to start Eliquis. Is his eye condition such that this is possible? Cristopher Peru

## 2016-05-03 NOTE — Telephone Encounter (Signed)
Dr Lovena Le routed a note to Dr Zigmund Daniel.  Will await his response  Patient is seeing MD on Wed

## 2016-05-04 ENCOUNTER — Encounter (INDEPENDENT_AMBULATORY_CARE_PROVIDER_SITE_OTHER): Payer: Medicare Other | Admitting: Ophthalmology

## 2016-05-04 DIAGNOSIS — H43813 Vitreous degeneration, bilateral: Secondary | ICD-10-CM | POA: Diagnosis not present

## 2016-05-04 DIAGNOSIS — H35033 Hypertensive retinopathy, bilateral: Secondary | ICD-10-CM

## 2016-05-04 DIAGNOSIS — I10 Essential (primary) hypertension: Secondary | ICD-10-CM | POA: Diagnosis not present

## 2016-05-04 DIAGNOSIS — H34832 Tributary (branch) retinal vein occlusion, left eye, with macular edema: Secondary | ICD-10-CM | POA: Diagnosis not present

## 2016-05-09 ENCOUNTER — Telehealth: Payer: Self-pay | Admitting: Cardiology

## 2016-05-09 ENCOUNTER — Other Ambulatory Visit: Payer: Self-pay

## 2016-05-09 MED ORDER — PRAVASTATIN SODIUM 20 MG PO TABS: 20.0000 mg | ORAL_TABLET | Freq: Every evening | ORAL | Status: DC

## 2016-05-09 MED ORDER — FUROSEMIDE 20 MG PO TABS: 60.0000 mg | ORAL_TABLET | Freq: Every day | ORAL | Status: DC

## 2016-05-09 MED ORDER — CARVEDILOL 25 MG PO TABS: ORAL_TABLET | ORAL | Status: DC

## 2016-05-09 MED ORDER — AMLODIPINE BESYLATE 10 MG PO TABS: 10.0000 mg | ORAL_TABLET | Freq: Every day | ORAL | Status: DC

## 2016-05-09 NOTE — Telephone Encounter (Signed)
done

## 2016-05-09 NOTE — Telephone Encounter (Signed)
Pt is switching his pharmacy to [facility] order and would like to have 90 day supply

## 2016-05-09 NOTE — Telephone Encounter (Signed)
90 day refills forwarded to [facility] per pt

## 2016-05-17 DIAGNOSIS — H40023 Open angle with borderline findings, high risk, bilateral: Secondary | ICD-10-CM | POA: Diagnosis not present

## 2016-05-20 ENCOUNTER — Encounter: Payer: Self-pay | Admitting: Internal Medicine

## 2016-05-20 ENCOUNTER — Ambulatory Visit (INDEPENDENT_AMBULATORY_CARE_PROVIDER_SITE_OTHER): Payer: Medicare Other | Admitting: Internal Medicine

## 2016-05-20 ENCOUNTER — Ambulatory Visit (INDEPENDENT_AMBULATORY_CARE_PROVIDER_SITE_OTHER)
Admission: RE | Admit: 2016-05-20 | Discharge: 2016-05-20 | Disposition: A | Discharge status: Home / Self Care | Payer: Medicare Other | Source: Ambulatory Visit | Attending: Internal Medicine | Admitting: Internal Medicine

## 2016-05-20 VITALS — BP 118/68 | HR 60 | Ht 72.0 in | Wt 205.8 lb

## 2016-05-20 DIAGNOSIS — I5030 Unspecified diastolic (congestive) heart failure: Secondary | ICD-10-CM

## 2016-05-20 DIAGNOSIS — G4733 Obstructive sleep apnea (adult) (pediatric): Secondary | ICD-10-CM | POA: Diagnosis not present

## 2016-05-20 DIAGNOSIS — R0609 Other forms of dyspnea: Secondary | ICD-10-CM

## 2016-05-20 DIAGNOSIS — I4891 Unspecified atrial fibrillation: Secondary | ICD-10-CM | POA: Diagnosis not present

## 2016-05-20 DIAGNOSIS — R0602 Shortness of breath: Secondary | ICD-10-CM | POA: Diagnosis not present

## 2016-05-20 NOTE — Patient Instructions (Signed)
Order- schedule sleep study - prefer Home Test-   Dx OSA  Order- walk test on room air    Dx   Dyspnea on exertion  Order- office spirometry     Dx dyspnea on exertion, Afib  Order- CXR   Dx dyspnea on exertion, AFib

## 2016-05-20 NOTE — Progress Notes (Signed)
12/15/11- 78 yoM never smoker followed for Allergic rhinitis, hx OSA, dyspnea, complicated by DM, iron def anemia, HBP, CHF LOV-12/15/10 Had flu vax. His degenerative disk disease is his major limitation- inoperable per NSGY. Comfortable with CPAP 12/ [facility].  Notes marked dyspnea bending over- crowds him. Chronic edema- tried diuretics.   12/06/12- 79 yoM never smoker followed for Allergic rhinitis, hx OSA, dyspnea, complicated by DM, iron def anemia, HBP, CHF,  Degen Disk Dis FOLLOWS [id number] not sleeping well overall-recently had stomach surgery Reports laparotomy at [facility] in March for what may have been a volvulus- I don't have those records. No respiratory complications known to patient. OSA- not using CPAP regularly. Cites unable to lie comfortably to sleep due to chronic hip and back pain from known inoperable arthritis and disk disease, as well as frequent nocturia.  Rhinitis not big problem. Little dyspnea with no cough or wheeze. Feet have swollen for 2-3 years. His PCP manages this with lasix. Can't keep feet elevated enough of time to help.  Note weight 2011 was 202 lbs, now 196 lbs.  01/17/13- 79 yoM never smoker followed for Allergic rhinitis, hx OSA, dyspnea, complicated by DM, iron def anemia, HBP, CHF,  Degen Disk Dis FOLLOWS FOR: not using CPAP; [facility] D[facility]  faxing ONO results to [place] 12/26/2012- did not qualify for O2 with sleep.  He still occasionally uses CPAP but says he has too much arthritis and back pain. Because of this he changes position and is up and down between chair in bed a lot during the night. CPAP was too difficult. CXR 12/25/12 IMPRESSION:  No acute cardiopulmonary abnormality seen.  Original Report Authenticated By: Marijo Conception., M.D.   01/17/14- 66 yoM never smoker followed for Allergic rhinitis, hx OSA, dyspnea, complicated by DM, iron def anemia, HBP, CHF/ pacemaker,  Degen Disk Dis Follows for: pt has not been wearing cpap lately.  Pt has  had a pacemaker placed since LV.  Pt's wife states he snores occasionally. The patient admits he falls asleep if he sits. Does not wear CPAP because he is up and down all night due to back pain which contributes to daytime sleepiness. Frequent nocturia. CPAP was too confining. CXR 09/14/13 IMPRESSION:  Left subclavian dual lead pacemaker in satisfactory position. No  pneumothorax.  Electronically Signed  By: Julian Hy M.D.  On: 09/14/2013 10:04  05/20/2016-82 year old male never smoker followed for Allergic rhinitis, history OSA/quit CPAP, dyspnea, complicated by DM, iron deficiency anemia, HBP, CHF/pacemaker, degenerative disc disease FOLLOWS FOR: DME: [facility]. Pt states he has not been wearing CPAP at all due to mask issues. Has tried several masks and unable to tolerate.  Wife wanted him assessed because of dyspnea on exertion. No acute event and no obvious infection. On Eliquis for atrial fibrillation, with pacemaker Feet swell. Little cough or wheeze. Office Spirometry 05/20/2016-moderate restriction of exhaled volume-FVC 2.63/57%, FEV1 2.11/62%, FEV1/FVC 0.8 Walk test on room air 05/20/2016-oxygen saturation 98%, 96%, 91%, 95% after 185 feet 3. No significant oxygen desaturation with this exercise. He could never tolerate CPAP but is willing to consider evaluation for an oral appliance.  ROS-see HPI Constitutional:   No-  acute weight loss, night sweats, fevers, chills,  +fatigue, lassitude. HEENT:   No-  headaches, difficulty swallowing, tooth/dental problems, sore throat,       No-  sneezing, itching, ear ache, nasal congestion, post nasal drip,  CV:  No-   chest pain, orthopnea, PND, +swelling in lower extremities, No-anasarca,  dizziness,  palpitations Resp:  No change in mild chronic shortness of breath with exertion or at rest.              No-   productive cough,  No non-productive cough,  No- coughing up of blood.              No-   change in color of mucus.  No-  wheezing.   Skin: No-   rash or lesions. GI:  No-   heartburn, indigestion, abdominal pain, nausea, vomiting,  GU: +nocturia MS:  +   joint pain or swelling.  + decreased range of motion.  + back pain. Neuro-     nothing unusual Psych:  No- change in mood or affect. No depression or anxiety.  No memory loss.  OBJ General- Alert, Oriented, Affect-appropriate, Distress- none acute, rolling walker, overweight Skin- rash-none, lesions- none, excoriation- none Lymphadenopathy- none Head- atraumatic            Eyes- Gross vision intact, PERRLA, conjunctivae clear secretions            Ears- Hearing aid            Nose- Clear, no-Septal dev, mucus, polyps, erosion, perforation             Throat- Mallampati III-IV , mucosa clear , drainage- none, tonsils- atrophic. +Own teeth Neck- flexible , trachea midline, no stridor , thyroid nl, carotid no bruit Chest - symmetrical excursion , unlabored           Heart/CV- RRR , no murmur , no gallop  , no rub, nl s1 s2                           - JVD- none , edema- 3-4+, stasis changes- none, varices- none           Lung- clear to P&A, wheeze- none, cough- none , dullness-none, rub- none. No rales heard           Chest wall- +R pacemaker Abd-  Br/ Gen/ Rectal- Not done, not indicated Extrem- cyanosis- none, clubbing, none, atrophy- none, strength- nl Neuro- grossly intact to observation

## 2016-05-20 NOTE — Assessment & Plan Note (Signed)
Most of his exertional dyspnea is likely related to his chronic congestive heart failure with peripheral edema, complicated by obesity and deconditioning. He works with the cardiologist This Problem.

## 2016-05-20 NOTE — Assessment & Plan Note (Signed)
We need to requalify him and can probably get a home sleep test done. He never tolerated CPAP, partly because he had to be up and down so much at night with back pain and nocturia. He might be able to benefit from an oral appliance as discussed. Plan-schedule home sleep test

## 2016-05-22 ENCOUNTER — Observation Stay (HOSPITAL_COMMUNITY)
Admission: EM | Admit: 2016-05-22 | Discharge: 2016-05-23 | Disposition: A | Discharge status: Home / Self Care | Payer: Medicare Other | Attending: Internal Medicine | Admitting: Internal Medicine

## 2016-05-22 ENCOUNTER — Encounter (HOSPITAL_COMMUNITY): Payer: Self-pay | Admitting: Emergency Medicine

## 2016-05-22 ENCOUNTER — Emergency Department (HOSPITAL_COMMUNITY): Payer: Medicare Other

## 2016-05-22 DIAGNOSIS — Z79899 Other long term (current) drug therapy: Secondary | ICD-10-CM | POA: Insufficient documentation

## 2016-05-22 DIAGNOSIS — I481 Persistent atrial fibrillation: Secondary | ICD-10-CM

## 2016-05-22 DIAGNOSIS — Z6826 Body mass index (BMI) 26.0-26.9, adult: Secondary | ICD-10-CM | POA: Diagnosis not present

## 2016-05-22 DIAGNOSIS — F329 Major depressive disorder, single episode, unspecified: Secondary | ICD-10-CM | POA: Insufficient documentation

## 2016-05-22 DIAGNOSIS — E669 Obesity, unspecified: Secondary | ICD-10-CM | POA: Insufficient documentation

## 2016-05-22 DIAGNOSIS — R079 Chest pain, unspecified: Secondary | ICD-10-CM | POA: Diagnosis present

## 2016-05-22 DIAGNOSIS — I5030 Unspecified diastolic (congestive) heart failure: Secondary | ICD-10-CM | POA: Insufficient documentation

## 2016-05-22 DIAGNOSIS — I495 Sick sinus syndrome: Secondary | ICD-10-CM | POA: Diagnosis present

## 2016-05-22 DIAGNOSIS — R0789 Other chest pain: Secondary | ICD-10-CM | POA: Diagnosis not present

## 2016-05-22 DIAGNOSIS — I110 Hypertensive heart disease with heart failure: Secondary | ICD-10-CM | POA: Insufficient documentation

## 2016-05-22 DIAGNOSIS — I2510 Atherosclerotic heart disease of native coronary artery without angina pectoris: Secondary | ICD-10-CM | POA: Diagnosis present

## 2016-05-22 DIAGNOSIS — I4891 Unspecified atrial fibrillation: Secondary | ICD-10-CM | POA: Diagnosis present

## 2016-05-22 DIAGNOSIS — M1990 Unspecified osteoarthritis, unspecified site: Secondary | ICD-10-CM | POA: Insufficient documentation

## 2016-05-22 DIAGNOSIS — E876 Hypokalemia: Secondary | ICD-10-CM | POA: Diagnosis present

## 2016-05-22 DIAGNOSIS — E119 Type 2 diabetes mellitus without complications: Secondary | ICD-10-CM

## 2016-05-22 DIAGNOSIS — R031 Nonspecific low blood-pressure reading: Secondary | ICD-10-CM | POA: Diagnosis not present

## 2016-05-22 DIAGNOSIS — I10 Essential (primary) hypertension: Secondary | ICD-10-CM | POA: Diagnosis present

## 2016-05-22 LAB — COMPREHENSIVE METABOLIC PANEL
ALBUMIN: 4.1 g/dL (ref 3.5–5.0)
ALT: 18 U/L (ref 17–63)
ANION GAP: 7 (ref 5–15)
AST: 19 U/L (ref 15–41)
Alkaline Phosphatase: 54 U/L (ref 38–126)
BILIRUBIN TOTAL: 0.8 mg/dL (ref 0.3–1.2)
BUN: 26 mg/dL — ABNORMAL HIGH (ref 6–20)
CHLORIDE: 106 mmol/L (ref 101–111)
CO2: 27 mmol/L (ref 22–32)
Calcium: 9.7 mg/dL (ref 8.9–10.3)
Creatinine, Ser: 1.17 mg/dL (ref 0.61–1.24)
GFR calc Af Amer: >60 mL/min (ref >60)
GFR, EST NON AFRICAN AMERICAN: 57 mL/min — AB (ref >60)
GLUCOSE: 131 mg/dL — AB (ref 65–99)
POTASSIUM: 3.4 mmol/L — AB (ref 3.5–5.1)
Sodium: 140 mmol/L (ref 135–145)
TOTAL PROTEIN: 7.5 g/dL (ref 6.5–8.1)

## 2016-05-22 LAB — CBC WITH DIFFERENTIAL/PLATELET
BASOS ABS: 0 10*3/uL (ref 0.0–0.1)
BASOS PCT: 0 %
EOS PCT: 1 %
Eosinophils Absolute: 0.1 10*3/uL (ref 0.0–0.7)
HEMATOCRIT: 40.3 % (ref 39.0–52.0)
Hemoglobin: 14.1 g/dL (ref 13.0–17.0)
Lymphocytes Relative: 22 %
Lymphs Abs: 1.6 10*3/uL (ref 0.7–4.0)
MCH: 31.8 pg (ref 26.0–34.0)
MCHC: 35 g/dL (ref 30.0–36.0)
MCV: 91 fL (ref 78.0–100.0)
MONO ABS: 0.8 10*3/uL (ref 0.1–1.0)
Monocytes Relative: 12 %
NEUTROS ABS: 4.5 10*3/uL (ref 1.7–7.7)
Neutrophils Relative %: 65 %
PLATELETS: 221 10*3/uL (ref 150–400)
RBC: 4.43 MIL/uL (ref 4.22–5.81)
RDW: 13.6 % (ref 11.5–15.5)
WBC: 6.9 10*3/uL (ref 4.0–10.5)

## 2016-05-22 LAB — MAGNESIUM: Magnesium: 1.9 mg/dL (ref 1.7–2.4)

## 2016-05-22 LAB — TROPONIN I
Troponin I: <0.03 ng/mL (ref <0.031)
Troponin I: <0.03 ng/mL (ref <0.031)

## 2016-05-22 LAB — GLUCOSE, CAPILLARY: Glucose-Capillary: 241 mg/dL — ABNORMAL HIGH (ref 65–99)

## 2016-05-22 LAB — I-STAT TROPONIN, ED: TROPONIN I, POC: 0 ng/mL (ref 0.00–0.08)

## 2016-05-22 LAB — BRAIN NATRIURETIC PEPTIDE: B NATRIURETIC PEPTIDE 5: 167 pg/mL — AB (ref 0.0–100.0)

## 2016-05-22 MED ORDER — TAMSULOSIN HCL 0.4 MG PO CAPS
0.4000 mg | ORAL_CAPSULE | Freq: Every day | ORAL | Status: DC
Start: 2016-05-23 — End: 2016-05-23
  Administered 2016-05-23: 0.4 mg via ORAL
  Filled 2016-05-22: qty 1

## 2016-05-22 MED ORDER — AMLODIPINE BESYLATE 5 MG PO TABS
10.0000 mg | ORAL_TABLET | Freq: Every day | ORAL | Status: DC
  Administered 2016-05-23: 10 mg via ORAL
  Filled 2016-05-22: qty 2

## 2016-05-22 MED ORDER — GI COCKTAIL ~~LOC~~: 30.0000 mL | Freq: Four times a day (QID) | ORAL | Status: DC | PRN

## 2016-05-22 MED ORDER — MORPHINE SULFATE (PF) 2 MG/ML IV SOLN: 2.0000 mg | INTRAVENOUS | Status: DC | PRN

## 2016-05-22 MED ORDER — INSULIN ASPART 100 UNIT/ML ~~LOC~~ SOLN
0.0000 [IU] | Freq: Three times a day (TID) | SUBCUTANEOUS | Status: DC
  Administered 2016-05-22: 3 [IU] via SUBCUTANEOUS
  Administered 2016-05-23: 1 [IU] via SUBCUTANEOUS

## 2016-05-22 MED ORDER — OXYBUTYNIN CHLORIDE 5 MG PO TABS
5.0000 mg | ORAL_TABLET | Freq: Every day | ORAL | Status: DC
  Administered 2016-05-23: 5 mg via ORAL
  Filled 2016-05-22: qty 1

## 2016-05-22 MED ORDER — ACETAMINOPHEN 325 MG PO TABS: 650.0000 mg | ORAL_TABLET | ORAL | Status: DC | PRN

## 2016-05-22 MED ORDER — ASPIRIN EC 81 MG PO TBEC
81.0000 mg | DELAYED_RELEASE_TABLET | Freq: Every day | ORAL | Status: DC
  Administered 2016-05-23: 81 mg via ORAL
  Filled 2016-05-22: qty 1

## 2016-05-22 MED ORDER — POTASSIUM CHLORIDE CRYS ER 20 MEQ PO TBCR
40.0000 meq | EXTENDED_RELEASE_TABLET | Freq: Once | ORAL | Status: AC
  Administered 2016-05-22: 40 meq via ORAL
  Filled 2016-05-22: qty 2

## 2016-05-22 MED ORDER — ENALAPRIL MALEATE 5 MG PO TABS
20.0000 mg | ORAL_TABLET | Freq: Two times a day (BID) | ORAL | Status: DC
  Administered 2016-05-22 – 2016-05-23 (×2): 20 mg via ORAL
  Filled 2016-05-22 (×2): qty 4

## 2016-05-22 MED ORDER — PRAVASTATIN SODIUM 10 MG PO TABS
20.0000 mg | ORAL_TABLET | Freq: Every evening | ORAL | Status: DC
  Administered 2016-05-22: 20 mg via ORAL
  Filled 2016-05-22: qty 2

## 2016-05-22 MED ORDER — OMEGA-3 FATTY ACIDS 1000 MG PO CAPS: 1.0000 g | ORAL_CAPSULE | Freq: Two times a day (BID) | ORAL | Status: DC

## 2016-05-22 MED ORDER — BRIMONIDINE TARTRATE-TIMOLOL 0.2-0.5 % OP SOLN: 1.0000 [drp] | Freq: Two times a day (BID) | OPHTHALMIC | Status: DC

## 2016-05-22 MED ORDER — TIMOLOL MALEATE 0.5 % OP SOLN
1.0000 [drp] | Freq: Two times a day (BID) | OPHTHALMIC | Status: DC
  Administered 2016-05-22 – 2016-05-23 (×2): 1 [drp] via OPHTHALMIC
  Filled 2016-05-22: qty 5

## 2016-05-22 MED ORDER — ONDANSETRON 4 MG PO TBDP: 8.0000 mg | ORAL_TABLET | Freq: Three times a day (TID) | ORAL | Status: DC | PRN

## 2016-05-22 MED ORDER — BRIMONIDINE TARTRATE 0.2 % OP SOLN
1.0000 [drp] | Freq: Two times a day (BID) | OPHTHALMIC | Status: DC
  Administered 2016-05-22 – 2016-05-23 (×2): 1 [drp] via OPHTHALMIC
  Filled 2016-05-22: qty 5

## 2016-05-22 MED ORDER — FUROSEMIDE 40 MG PO TABS
60.0000 mg | ORAL_TABLET | Freq: Every day | ORAL | Status: DC
  Administered 2016-05-23: 60 mg via ORAL
  Filled 2016-05-22: qty 1

## 2016-05-22 MED ORDER — DORZOLAMIDE HCL 2 % OP SOLN
1.0000 [drp] | Freq: Three times a day (TID) | OPHTHALMIC | Status: DC
  Administered 2016-05-22 – 2016-05-23 (×3): 1 [drp] via OPHTHALMIC
  Filled 2016-05-22: qty 10

## 2016-05-22 MED ORDER — ADULT MULTIVITAMIN W/MINERALS CH
1.0000 | ORAL_TABLET | Freq: Every day | ORAL | Status: DC
  Administered 2016-05-23: 1 via ORAL
  Filled 2016-05-22: qty 1

## 2016-05-22 MED ORDER — POTASSIUM CHLORIDE CRYS ER 10 MEQ PO TBCR
10.0000 meq | EXTENDED_RELEASE_TABLET | Freq: Every day | ORAL | Status: DC
  Administered 2016-05-23: 10 meq via ORAL
  Filled 2016-05-22: qty 1

## 2016-05-22 MED ORDER — INSULIN ASPART 100 UNIT/ML ~~LOC~~ SOLN: 0.0000 [IU] | Freq: Every day | SUBCUTANEOUS | Status: DC

## 2016-05-22 MED ORDER — ONDANSETRON HCL 4 MG/2ML IJ SOLN: 4.0000 mg | Freq: Four times a day (QID) | INTRAMUSCULAR | Status: DC | PRN

## 2016-05-22 MED ORDER — NITROGLYCERIN 0.4 MG SL SUBL: 0.4000 mg | SUBLINGUAL_TABLET | SUBLINGUAL | Status: DC | PRN

## 2016-05-22 MED ORDER — CARVEDILOL 12.5 MG PO TABS
25.0000 mg | ORAL_TABLET | Freq: Two times a day (BID) | ORAL | Status: DC
  Administered 2016-05-22 – 2016-05-23 (×2): 25 mg via ORAL
  Filled 2016-05-22 (×2): qty 2

## 2016-05-22 MED ORDER — OMEGA-3-ACID ETHYL ESTERS 1 G PO CAPS
1.0000 g | ORAL_CAPSULE | Freq: Two times a day (BID) | ORAL | Status: DC
  Administered 2016-05-22 – 2016-05-23 (×2): 1 g via ORAL
  Filled 2016-05-22 (×2): qty 1

## 2016-05-22 MED ORDER — APIXABAN 5 MG PO TABS
5.0000 mg | ORAL_TABLET | Freq: Two times a day (BID) | ORAL | Status: DC
  Administered 2016-05-22 – 2016-05-23 (×2): 5 mg via ORAL
  Filled 2016-05-22 (×2): qty 1

## 2016-05-22 NOTE — H&P (Signed)
History and Physical    [name] [id number] DOB: 14-Dec-1933 DOA: 05/22/2016  Referring MD/NP/PA: Francine Graven, EDP PCP: Glenda Chroman, MD  Patient coming from: Home  Chief Complaint: Chest pain  HPI: [name] is a 82 y.o. male with multiple medical comorbidities including hypertension, coronary artery disease with last catheterization in 2014 that showedLM nl, LAD 24m, D1 80ost, 95p, LCX 37m, RCA 20d, EF 60%, Diet-controlled diabetes, osteoporosis who comes in today with chest pain. He states that this morning after eating breakfast he went to the living room and was sitting down watching TV when he developed substernal chest pain that felt like pressure, without radiation, no dizziness or diaphoresis that resolved after taking 2 nitroglycerin. He subsequently went to church and while there mentioned to one of his EMS friends that he had had chest pain earlier in the day and 911 was called to transport him to the hospital. We have been asked to admit him for further evaluation and management.  Past Medical/Surgical History: Past Medical History  Diagnosis Date  . Allergic rhinitis   . Obesity   . Essential hypertension, benign   . Diaphragmatic hernia without mention of obstruction or gangrene   . Anxiety   . Anemia   . Elevated liver function tests   . Diastolic congestive heart failure ([place])   . OSA on CPAP     "wears mask part of the time" (09/10/2013)  . Diabetes mellitus     "diet controlled" (09/10/2013)  . Arthritis     "all over my body" (09/10/2013)  . Osteoporosis   . Chronic lower back pain   . Depression   . BPH (benign prostatic hypertrophy)   . Sinus node dysfunction ([place])     a. 08/2013 s/p SJM DC PPM.  . CAD (coronary artery disease)     a. 08/2013 LM nl, LAD 35m, D1 80ost, 95p, LCX 15m, RCA 20d, EF 60%.  . Lacunar infarction ([place])     a. 08/2013 post-cath, MRI: sm acute lacunar infarcts in LPCA and LSCA, ? tiny RPICA lacunar infarct.    Past  Surgical History  Procedure Laterality Date  . Cardiac catheterization  1998  . Cataract extraction w/ intraocular lens  implant, bilateral  2010  . Stomach surgery  03/02/2012    ?volvus; "stomach came loose and twisted; went up under rib cage; had that corrected; tacked it up" (09/10/2013)  . Left and right heart catheterization with coronary angiogram N/A 09/11/2013    Procedure: LEFT AND RIGHT HEART CATHETERIZATION WITH CORONARY ANGIOGRAM;  Surgeon: Wellington Hampshire, MD;  Location: [facility] CATH LAB;  Service: Cardiovascular;  Laterality: N/A;  . Permanent pacemaker insertion N/A 09/13/2013    Procedure: PERMANENT PACEMAKER INSERTION;  Surgeon: Evans Lance, MD;  Location: [facility] CATH LAB;  Service: Cardiovascular;  Laterality: N/A;     reports that he has never smoked. He has never used smokeless tobacco. He reports that he does not drink alcohol or use illicit drugs.  Allergies: No Known Allergies  Family History:  Family History  Problem Relation Age of Onset  . Hypertension Mother   . Hypertension Father     Prior to Admission medications   Medication Sig Start Date End Date Taking? Authorizing Provider  acetaminophen (TYLENOL) 500 MG tablet Take 500 mg by mouth every 6 (six) hours as needed for pain.   Yes Historical Provider, MD  alendronate (FOSAMAX) 70 MG tablet Take 70 mg by mouth every 7 (seven) days.  Take with a full glass of water on an empty stomach. On Mondays   Yes Historical Provider, MD  amLODipine (NORVASC) 10 MG tablet Take 1 tablet (10 mg total) by mouth daily. 05/09/16  Yes Arnoldo Lenis, MD  apixaban (ELIQUIS) 5 MG TABS tablet Take 1 tablet (5 mg total) by mouth 2 (two) times daily. 04/27/16  Yes Evans Lance, MD  Calcium Carbonate-Vitamin D (CALCIUM 600 + D PO) Take 600 mg by mouth 2 (two) times daily.   Yes Historical Provider, MD  carvedilol (COREG) 25 MG tablet TAKE 1 & 1/2 TABLETS BY MOUTH TWICE DAILY 05/09/16  Yes Arnoldo Lenis, MD  COMBIGAN 0.2-0.5 %  ophthalmic solution Place 1 drop into both eyes 2 (two) times daily. 05/04/16  Yes Historical Provider, MD  dorzolamide (TRUSOPT) 2 % ophthalmic solution Place 1 drop into both eyes 3 (three) times daily. 05/12/16  Yes Historical Provider, MD  enalapril (VASOTEC) 20 MG tablet TAKE 1 TABLET BY MOUTH 2 TIMES DAILY. 01/22/16  Yes Arnoldo Lenis, MD  fish oil-omega-3 fatty acids 1000 MG capsule Take 1 g by mouth 2 (two) times daily.     Yes Historical Provider, MD  furosemide (LASIX) 20 MG tablet Take 3 tablets (60 mg total) by mouth daily. 05/09/16  Yes Arnoldo Lenis, MD  Multiple Vitamin (MULTIVITAMIN WITH MINERALS) TABS tablet Take 1 tablet by mouth daily.   Yes Historical Provider, MD  nitroGLYCERIN (NITROSTAT) 0.4 MG SL tablet Place 1 tablet (0.4 mg total) under the tongue every 5 (five) minutes x 3 doses as needed for chest pain. 02/18/15  Yes Arnoldo Lenis, MD  NON FORMULARY Uses 3-4 times daily. 3% amantadine 3% carbamazepine 6% Gabapentin 0.5%Pentoxifylline 3% compounded preparation   Yes Historical Provider, MD  oxybutynin (DITROPAN) 5 MG tablet Take 5 mg by mouth daily.   Yes Historical Provider, MD  potassium chloride SA (K-DUR,KLOR-CON) 20 MEQ tablet Take 10 mEq by mouth daily. 09/14/13  Yes Rogelia Mire, NP  pravastatin (PRAVACHOL) 20 MG tablet Take 1 tablet (20 mg total) by mouth every evening. 05/09/16  Yes Arnoldo Lenis, MD  tamsulosin (FLOMAX) 0.4 MG CAPS capsule Take 0.4 mg by mouth daily.  04/30/15  Yes Historical Provider, MD  traMADol (ULTRAM) 50 MG tablet Take 50 mg by mouth every 6 (six) hours as needed.    Yes Historical Provider, MD    Review of Systems:  Constitutional: Denies fever, chills, diaphoresis, appetite change and fatigue.  HEENT: Denies photophobia, eye pain, redness, hearing loss, ear pain, congestion, sore throat, rhinorrhea, sneezing, mouth sores, trouble swallowing, neck pain, neck stiffness and tinnitus.   Respiratory: Denies SOB, DOE, cough,  and  wheezing.   Cardiovascular: Denies chest pain, palpitations and leg swelling.  Gastrointestinal: Denies nausea, vomiting, abdominal pain, diarrhea, constipation, blood in stool and abdominal distention.  Genitourinary: Denies dysuria, urgency, frequency, hematuria, flank pain and difficulty urinating.  Endocrine: Denies: hot or cold intolerance, sweats, changes in hair or nails, polyuria, polydipsia. Musculoskeletal: Denies myalgias, back pain, joint swelling, arthralgias and gait problem.  Skin: Denies pallor, rash and wound.  Neurological: Denies dizziness, seizures, syncope, weakness, light-headedness, numbness and headaches.  Hematological: Denies adenopathy. Easy bruising, personal or family bleeding history  Psychiatric/Behavioral: Denies suicidal ideation, mood changes, confusion, nervousness, sleep disturbance and agitation    Physical Exam: Filed Vitals:   05/22/16 1344 05/22/16 1400 05/22/16 1530 05/22/16 1556  BP: 145/90 133/86 150/95 182/93  Pulse: 60 59 61 59  Temp:  98.2 F (36.8 C)  TempSrc:    Oral  Resp: 16 17 16 16   Height:    6\' 2"  (1.88 m)  Weight:    91.989 kg (202 lb 12.8 oz)  SpO2: 95% 93% 96% 99%     Constitutional: NAD, calm, comfortable Eyes: PERRL, lids and conjunctivae normal ENMT: Mucous membranes are moist. Posterior pharynx clear of any exudate or lesions.Normal dentition.  Neck: normal, supple, no masses, no thyromegaly Respiratory: clear to auscultation bilaterally, no wheezing, no crackles. Normal respiratory effort. No accessory muscle use.  Cardiovascular: Regular rate and rhythm, no murmurs / rubs / gallops. No extremity edema. 2+ pedal pulses. No carotid bruits.  Abdomen: no tenderness, no masses palpated. No hepatosplenomegaly. Bowel sounds positive.  Musculoskeletal: no clubbing / cyanosis. No joint deformity upper and lower extremities. Good ROM, no contractures. Normal muscle tone.  Skin: no rashes, lesions, ulcers. No  induration Neurologic: CN 2-12 grossly intact. Sensation intact, DTR normal. Strength 5/5 in all 4.  Psychiatric: Normal judgment and insight. Alert and oriented x 3. Normal mood.    Labs on Admission: I have personally reviewed the following labs and imaging studies  CBC:  Recent Labs Lab 05/22/16 1136  WBC 6.9  NEUTROABS 4.5  HGB 14.1  HCT 40.3  MCV 91.0  PLT [id number]   Basic Metabolic Panel:  Recent Labs Lab 05/22/16 1136  NA 140  K 3.4*  CL 106  CO2 27  GLUCOSE 131*  BUN 26*  CREATININE 1.17  CALCIUM 9.7   GFR: Estimated Creatinine Clearance: 58.5 mL/min (by C-G formula based on Cr of 1.17). Liver Function Tests:  Recent Labs Lab 05/22/16 1136  AST 19  ALT 18  ALKPHOS 54  BILITOT 0.8  PROT 7.5  ALBUMIN 4.1   No results for input(s): LIPASE, AMYLASE in the last 168 hours. No results for input(s): AMMONIA in the last 168 hours. Coagulation Profile: No results for input(s): INR, PROTIME in the last 168 hours. Cardiac Enzymes: No results for input(s): CKTOTAL, CKMB, CKMBINDEX, TROPONINI in the last 168 hours. BNP (last 3 results) No results for input(s): PROBNP in the last 8760 hours. HbA1C: No results for input(s): HGBA1C in the last 72 hours. CBG: No results for input(s): GLUCAP in the last 168 hours. Lipid Profile: No results for input(s): CHOL, HDL, LDLCALC, TRIG, CHOLHDL, LDLDIRECT in the last 72 hours. Thyroid Function Tests: No results for input(s): TSH, T4TOTAL, FREET4, T3FREE, THYROIDAB in the last 72 hours. Anemia Panel: No results for input(s): VITAMINB12, FOLATE, FERRITIN, TIBC, IRON, RETICCTPCT in the last 72 hours. Urine analysis:    Component Value Date/Time   COLORURINE YELLOW 09/11/2013 [place] 09/11/2013 2212   LABSPEC 1.016 09/11/2013 2212   PHURINE 8.0 09/11/2013 2212   GLUCOSEU 100* 09/11/2013 2212   HGBUR TRACE* 09/11/2013 2212   [place] 09/11/2013 2212   KETONESUR 15* 09/11/2013 2212    PROTEINUR 30* 09/11/2013 2212   UROBILINOGEN 1.0 09/11/2013 2212   NITRITE NEGATIVE 09/11/2013 2212   LEUKOCYTESUR NEGATIVE 09/11/2013 2212   Sepsis Labs: @LABRCNTIP (procalcitonin:4,lacticidven:4) )No results found for this or any previous visit (from the past 240 hour(s)).   Radiological Exams on Admission: Dg Chest 2 View  05/22/2016  CLINICAL DATA:  Chest pain today. EXAM: CHEST  2 VIEW COMPARISON:  05/20/2016. FINDINGS: The cardiac silhouette remains mildly enlarged. The aorta remains tortuous. Stable left subclavian pacemaker leads. Stable mild linear scarring at the left lung base. Otherwise, clear lungs. The hemidiaphragms remain flattened.  Diffuse osteopenia. Thoracic spine degenerative changes. IMPRESSION: 1. No acute abnormality. 2. Stable mild cardiomegaly and mild changes of COPD. Electronically Signed   By: Claudie Revering M.D.   On: 05/22/2016 12:52    EKG: Independently reviewed. Paced rhythm and as such cannot discern acute ischemic abnormalities  Assessment/Plan Principal Problem:   Chest pain Active Problems:   HYPERTENSION, BENIGN SYSTEMIC   Sinus node dysfunction (HCC)   CAD (coronary artery disease)   Atrial fibrillation (HCC)   Hypokalemia    Chest pain -Certainly high risk with history of coronary artery disease and a heart score of 5. -Admit to telemetry, cycle troponins, repeat EKG in a.m. -If troponins remain normal and he does not develop further chest pain, I would probably feel comfortable discharging him home with close outpatient follow-up with cardiology. -If troponins become positive or if he develops further chest pain, will need to consult cardiology as to next actions.  Hypokalemia -Repleted orally, check magnesium level.  Atrial fibrillation -Rate controlled, is anticoagulated on eloquent.  Sinus node dysfunction -Has a permanent pacemaker.  Hypertension -Well-controlled.  Diet-controlled diabetes -Check hemoglobin A1c, place on  sensitive sliding scale   DVT prophylaxis: Eliquis  Code Status: Full code  Family Communication: Wife at bedside updated on plan of care and all questions answered  Disposition Plan: Anticipate discharge home in 24 hours  Consults called: None  Admission status: Observation    Time Spent: 80 minutes  Lelon Frohlich MD [facility] Pager [phone]  If 7PM-7AM, please contact night-coverage [email or web address] Password [facility]  05/22/2016, 4:50 PM

## 2016-05-22 NOTE — ED Provider Notes (Signed)
CSN: [id number]     Arrival date & time 05/22/16  1054 History   First MD Initiated Contact with Patient 05/22/16 1212     Chief Complaint  Patient presents with  . Chest Pain      HPI Pt was seen at 1215. Per pt and his family, c/o gradual onset and resolution of 2 episodes of CP that occurred this morning PTA. Pt states one episode occurred while he was sitting in a chair, the other while getting ready to go to church. Pt describes the CP as "tightness" has been associated with SOB (especially on exertion). Pt took his own SL ntg with improvement in his symptoms. Pt states the episodes of CP today were "harder" and "worse" then his "usual CP" episodes. Denies palpitations, no cough, no abd pain, no N/V/D, no fevers.    Past Medical History  Diagnosis Date  . Allergic rhinitis   . Obesity   . Essential hypertension, benign   . Diaphragmatic hernia without mention of obstruction or gangrene   . Anxiety   . Anemia   . Elevated liver function tests   . Diastolic congestive heart failure ([place])   . OSA on CPAP     "wears mask part of the time" (09/10/2013)  . Diabetes mellitus     "diet controlled" (09/10/2013)  . Arthritis     "all over my body" (09/10/2013)  . Osteoporosis   . Chronic lower back pain   . Depression   . BPH (benign prostatic hypertrophy)   . Sinus node dysfunction ([place])     a. 08/2013 s/p SJM DC PPM.  . CAD (coronary artery disease)     a. 08/2013 LM nl, LAD 82m, D1 80ost, 95p, LCX 2m, RCA 20d, EF 60%.  . Lacunar infarction ([place])     a. 08/2013 post-cath, MRI: sm acute lacunar infarcts in LPCA and LSCA, ? tiny RPICA lacunar infarct.   Past Surgical History  Procedure Laterality Date  . Cardiac catheterization  1998  . Cataract extraction w/ intraocular lens  implant, bilateral  2010  . Stomach surgery  03/02/2012    ?volvus; "stomach came loose and twisted; went up under rib cage; had that corrected; tacked it up" (09/10/2013)  . Left and right heart  catheterization with coronary angiogram N/A 09/11/2013    Procedure: LEFT AND RIGHT HEART CATHETERIZATION WITH CORONARY ANGIOGRAM;  Surgeon: Wellington Hampshire, MD;  Location: [facility] CATH LAB;  Service: Cardiovascular;  Laterality: N/A;  . Permanent pacemaker insertion N/A 09/13/2013    Procedure: PERMANENT PACEMAKER INSERTION;  Surgeon: Evans Lance, MD;  Location: [facility] CATH LAB;  Service: Cardiovascular;  Laterality: N/A;   Family History  Problem Relation Age of Onset  . Hypertension Mother   . Hypertension Father    Social History  Substance Use Topics  . Smoking status: Never Smoker   . Smokeless tobacco: Never Used  . Alcohol Use: No    Review of Systems ROS: Statement: All systems negative except as marked or noted in the HPI; Constitutional: Negative for fever and chills. ; ; Eyes: Negative for eye pain, redness and discharge. ; ; ENMT: Negative for ear pain, hoarseness, nasal congestion, sinus pressure and sore throat. ; ; Cardiovascular: Negative for palpitations, diaphoresis, +CP, SOB. ; ; Respiratory: Negative for cough, wheezing and stridor. ; ; Gastrointestinal: Negative for nausea, vomiting, diarrhea, abdominal pain, blood in stool, hematemesis, jaundice and rectal bleeding. . ; ; Genitourinary: Negative for dysuria, flank pain and hematuria. ; ;  Musculoskeletal: Negative for back pain and neck pain. Negative for swelling and trauma.; ; Skin: Negative for pruritus, rash, abrasions, blisters, bruising and skin lesion.; ; Neuro: Negative for headache, lightheadedness and neck stiffness. Negative for weakness, altered level of consciousness, altered mental status, extremity weakness, paresthesias, involuntary movement, seizure and syncope.      Allergies  Review of patient's allergies indicates no known allergies.  Home Medications   Prior to Admission medications   Medication Sig Start Date End Date Taking? Authorizing Provider  acetaminophen (TYLENOL) 500 MG tablet Take 500 mg by  mouth every 6 (six) hours as needed for pain.   Yes Historical Provider, MD  alendronate (FOSAMAX) 70 MG tablet Take 70 mg by mouth every 7 (seven) days. Take with a full glass of water on an empty stomach. On Mondays   Yes Historical Provider, MD  amLODipine (NORVASC) 10 MG tablet Take 1 tablet (10 mg total) by mouth daily. 05/09/16  Yes Arnoldo Lenis, MD  apixaban (ELIQUIS) 5 MG TABS tablet Take 1 tablet (5 mg total) by mouth 2 (two) times daily. 04/27/16  Yes Evans Lance, MD  Calcium Carbonate-Vitamin D (CALCIUM 600 + D PO) Take 600 mg by mouth 2 (two) times daily.   Yes Historical Provider, MD  carvedilol (COREG) 25 MG tablet TAKE 1 & 1/2 TABLETS BY MOUTH TWICE DAILY 05/09/16  Yes Arnoldo Lenis, MD  COMBIGAN 0.2-0.5 % ophthalmic solution Place 1 drop into both eyes 2 (two) times daily. 05/04/16  Yes Historical Provider, MD  dorzolamide (TRUSOPT) 2 % ophthalmic solution Place 1 drop into both eyes 3 (three) times daily. 05/12/16  Yes Historical Provider, MD  enalapril (VASOTEC) 20 MG tablet TAKE 1 TABLET BY MOUTH 2 TIMES DAILY. 01/22/16  Yes Arnoldo Lenis, MD  fish oil-omega-3 fatty acids 1000 MG capsule Take 1 g by mouth 2 (two) times daily.     Yes Historical Provider, MD  furosemide (LASIX) 20 MG tablet Take 3 tablets (60 mg total) by mouth daily. 05/09/16  Yes Arnoldo Lenis, MD  Multiple Vitamin (MULTIVITAMIN WITH MINERALS) TABS tablet Take 1 tablet by mouth daily.   Yes Historical Provider, MD  nitroGLYCERIN (NITROSTAT) 0.4 MG SL tablet Place 1 tablet (0.4 mg total) under the tongue every 5 (five) minutes x 3 doses as needed for chest pain. 02/18/15  Yes Arnoldo Lenis, MD  NON FORMULARY Uses 3-4 times daily. 3% amantadine 3% carbamazepine 6% Gabapentin 0.5%Pentoxifylline 3% compounded preparation   Yes Historical Provider, MD  oxybutynin (DITROPAN) 5 MG tablet Take 5 mg by mouth daily.   Yes Historical Provider, MD  potassium chloride SA (K-DUR,KLOR-CON) 20 MEQ tablet Take 10 mEq  by mouth daily. 09/14/13  Yes Rogelia Mire, NP  pravastatin (PRAVACHOL) 20 MG tablet Take 1 tablet (20 mg total) by mouth every evening. 05/09/16  Yes Arnoldo Lenis, MD  tamsulosin (FLOMAX) 0.4 MG CAPS capsule Take 0.4 mg by mouth daily.  04/30/15  Yes Historical Provider, MD  traMADol (ULTRAM) 50 MG tablet Take 50 mg by mouth every 6 (six) hours as needed.    Yes Historical Provider, MD   BP 147/88 mmHg  Pulse 60  Temp(Src) 98.1 F (36.7 C) (Oral)  Resp 16  Ht 6\' 2"  (1.88 m)  Wt 205 lb (92.987 kg)  BMI 26.31 kg/m2  SpO2 97% Physical Exam  1220: Physical examination:  Nursing notes reviewed; Vital signs and O2 SAT reviewed;  Constitutional: Well developed, Well nourished, Well hydrated,  In no acute distress; Head:  Normocephalic, atraumatic; Eyes: EOMI, PERRL, No scleral icterus; ENMT: Mouth and pharynx normal, Mucous membranes moist; Neck: Supple, Full range of motion, No lymphadenopathy; Cardiovascular: Regular rate and rhythm, No gallop; Respiratory: Breath sounds clear & equal bilaterally, No wheezes.  Speaking full sentences with ease, Normal respiratory effort/excursion; Chest: Nontender, Movement normal; Abdomen: Soft, Nontender, Nondistended, Normal bowel sounds; Genitourinary: No CVA tenderness; Extremities: Pulses normal, No tenderness, +2 pedal edema bilat. No calf edema or asymmetry.; Neuro: AA&Ox3, Major CN grossly intact.  Speech clear. No gross focal motor or sensory deficits in extremities.; Skin: Color normal, Warm, Dry.   ED Course  Procedures (including critical care time) Labs Review   Imaging Review  I have personally reviewed and evaluated these images and lab results as part of my medical decision-making.   EKG Interpretation   Date/Time:  Sunday May 22 2016 11:07:55 EDT Ventricular Rate:  59 PR Interval:  68 QRS Duration: 185 QT Interval:  516 QTC Calculation: 511 R Axis:   -51 Text Interpretation:  Atrial-ventricular dual-paced complexes No  further  rhythm analysis attempted due to paced rhythm Left bundle branch block  Baseline wander When compared with ECG of 09/14/2013 Electronic ventricular  pacemaker has replaced Normal sinus rhythm Confirmed by [facility]  MD,  Nunzio Cory [phone]) on 05/22/2016 1:26:53 PM      MDM  MDM Reviewed: previous chart, nursing note and vitals Reviewed previous: labs and ECG Interpretation: labs, ECG and x-ray      Results for orders placed or performed during the hospital encounter of 05/22/16  CBC with Differential/Platelet  Result Value Ref Range   WBC 6.9 4.0 - 10.5 K/uL   RBC 4.43 4.22 - 5.81 MIL/uL   Hemoglobin 14.1 13.0 - 17.0 g/dL   HCT 40.3 39.0 - 52.0 %   MCV 91.0 78.0 - 100.0 fL   MCH 31.8 26.0 - 34.0 pg   MCHC 35.0 30.0 - 36.0 g/dL   RDW 13.6 11.5 - 15.5 %   Platelets 221 150 - 400 K/uL   Neutrophils Relative % 65 %   Neutro Abs 4.5 1.7 - 7.7 K/uL   Lymphocytes Relative 22 %   Lymphs Abs 1.6 0.7 - 4.0 K/uL   Monocytes Relative 12 %   Monocytes Absolute 0.8 0.1 - 1.0 K/uL   Eosinophils Relative 1 %   Eosinophils Absolute 0.1 0.0 - 0.7 K/uL   Basophils Relative 0 %   Basophils Absolute 0.0 0.0 - 0.1 K/uL  Comprehensive metabolic panel  Result Value Ref Range   Sodium 140 135 - 145 mmol/L   Potassium 3.4 (L) 3.5 - 5.1 mmol/L   Chloride 106 101 - 111 mmol/L   CO2 27 22 - 32 mmol/L   Glucose, Bld 131 (H) 65 - 99 mg/dL   BUN 26 (H) 6 - 20 mg/dL   Creatinine, Ser 1.17 0.61 - 1.24 mg/dL   Calcium 9.7 8.9 - 10.3 mg/dL   Total Protein 7.5 6.5 - 8.1 g/dL   Albumin 4.1 3.5 - 5.0 g/dL   AST 19 15 - 41 U/L   ALT 18 17 - 63 U/L   Alkaline Phosphatase 54 38 - 126 U/L   Total Bilirubin 0.8 0.3 - 1.2 mg/dL   GFR calc non Af Amer 57 (L) >60 mL/min   GFR calc Af Amer >60 >60 mL/min   Anion gap 7 5 - 15  Brain natriuretic peptide  Result Value Ref Range   B Natriuretic Peptide 167.0 (  H) 0.0 - 100.0 pg/mL  I-stat troponin, ED  Result Value Ref Range   Troponin i, poc 0.00 0.00  - 0.08 ng/mL   Comment 3           Dg Chest 2 View 05/22/2016  CLINICAL DATA:  Chest pain today. EXAM: CHEST  2 VIEW COMPARISON:  05/20/2016. FINDINGS: The cardiac silhouette remains mildly enlarged. The aorta remains tortuous. Stable left subclavian pacemaker leads. Stable mild linear scarring at the left lung base. Otherwise, clear lungs. The hemidiaphragms remain flattened. Diffuse osteopenia. Thoracic spine degenerative changes. IMPRESSION: 1. No acute abnormality. 2. Stable mild cardiomegaly and mild changes of COPD. Electronically Signed   By: Claudie Revering M.D.   On: 05/22/2016 12:52    1355:  Denies CP while in the ED. Pt endorses change in his usual anginal symptoms, will observation admit. Dx and testing d/w pt and family.  Questions answered.  Verb understanding, agreeable to admit. T/C to [facility]. Jerilee Hoh, case discussed, including:  HPI, pertinent PM/SHx, VS/PE, dx testing, ED course and treatment:  Agreeable to admit, requests to write temporary orders, obtain observation tele bed to team APAdmits.    Francine Graven, DO 05/23/16 2028

## 2016-05-22 NOTE — ED Notes (Signed)
Denies any pain, patient states he has multiple problems and see physicians often, no distress noted

## 2016-05-22 NOTE — ED Notes (Signed)
Patient arrives via EMS with c/o chest pain earlier this morning. No current chest pain. States took 2 nitro with relief of pain. Patient reports pacemaker.

## 2016-05-23 DIAGNOSIS — E119 Type 2 diabetes mellitus without complications: Secondary | ICD-10-CM

## 2016-05-23 DIAGNOSIS — I10 Essential (primary) hypertension: Secondary | ICD-10-CM | POA: Diagnosis not present

## 2016-05-23 DIAGNOSIS — I480 Paroxysmal atrial fibrillation: Secondary | ICD-10-CM

## 2016-05-23 DIAGNOSIS — M1990 Unspecified osteoarthritis, unspecified site: Secondary | ICD-10-CM | POA: Diagnosis not present

## 2016-05-23 DIAGNOSIS — R079 Chest pain, unspecified: Secondary | ICD-10-CM

## 2016-05-23 DIAGNOSIS — E876 Hypokalemia: Secondary | ICD-10-CM

## 2016-05-23 DIAGNOSIS — I482 Chronic atrial fibrillation: Secondary | ICD-10-CM

## 2016-05-23 DIAGNOSIS — R0789 Other chest pain: Secondary | ICD-10-CM | POA: Diagnosis not present

## 2016-05-23 DIAGNOSIS — E669 Obesity, unspecified: Secondary | ICD-10-CM | POA: Diagnosis not present

## 2016-05-23 LAB — BASIC METABOLIC PANEL
ANION GAP: 7 (ref 5–15)
BUN: 23 mg/dL — ABNORMAL HIGH (ref 6–20)
CALCIUM: 8.9 mg/dL (ref 8.9–10.3)
CO2: 26 mmol/L (ref 22–32)
CREATININE: 1.07 mg/dL (ref 0.61–1.24)
Chloride: 105 mmol/L (ref 101–111)
Glucose, Bld: 130 mg/dL — ABNORMAL HIGH (ref 65–99)
Potassium: 3.6 mmol/L (ref 3.5–5.1)
SODIUM: 138 mmol/L (ref 135–145)

## 2016-05-23 LAB — GLUCOSE, CAPILLARY
Glucose-Capillary: 130 mg/dL — ABNORMAL HIGH (ref 65–99)
Glucose-Capillary: 137 mg/dL — ABNORMAL HIGH (ref 65–99)

## 2016-05-23 LAB — TROPONIN I: Troponin I: <0.03 ng/mL (ref <0.031)

## 2016-05-23 MED ORDER — POTASSIUM CHLORIDE CRYS ER 20 MEQ PO TBCR: 20.0000 meq | EXTENDED_RELEASE_TABLET | Freq: Every day | ORAL | Status: DC

## 2016-05-23 MED ORDER — FAMOTIDINE 20 MG PO TABS: 20.0000 mg | ORAL_TABLET | Freq: Every day | ORAL | Status: DC

## 2016-05-23 MED ORDER — ISOSORBIDE MONONITRATE ER 30 MG PO TB24: 15.0000 mg | ORAL_TABLET | Freq: Every day | ORAL | Status: DC

## 2016-05-23 NOTE — Care Management Obs Status (Signed)
[facility] NOTIFICATION   Patient Details  Name: [name] MRN: [id number] Date of Birth: 03/02/1933   Medicare Observation Status Notification Given:  Yes    Sherald Barge, RN 05/23/2016, 8:09 AM

## 2016-05-23 NOTE — Progress Notes (Deleted)
Physician Discharge Summary  [name] [id number] DOB: Jul 27, 1933 DOA: 05/22/2016  PCP: Glenda Chroman, MD  Admit date: 05/22/2016 Discharge date: 05/23/2016  Time spent: Greater than 30 minutes  Recommendations for Outpatient Follow-up:  1. Recommend follow-up of the patient's serum potassium.  2. Hemoglobin A1c result was pending at the time of discharge.   Discharge Diagnoses:  1. Chest pain in a patient with CAD, myocardial infarction ruled out. 2. Hypokalemia. 3. Chronic atrial fibrillation, on Eliquis. 4. Essential hypertension. 5. Pacemaker in situ. 6. Diet-controlled diabetes mellitus.   Discharge Condition: Improved.  Diet recommendation: Heart healthy/carbohydrate modified.  Filed Weights   05/22/16 1106 05/22/16 1556  Weight: 92.987 kg (205 lb) 91.989 kg (202 lb 12.8 oz)    History of present illness:  Patient is an 82 year old man with a history of CAD, hypertension, diet-controlled diabetes mellitus, pacemaker in situ, atrial fibrillation, who presented to the ED on 05/22/2016 with a chief complaint of chest pain. In the ED, he was afebrile and hemodynamically stable. His EKG revealed a paced rhythm. His chest x-ray revealed no acute abnormalities. History [name] 9 was negative. His potassium was 3.4. His BMP was only 167. His glucose was 131. He was admitted for further evaluation and management.  Hospital Course:  The patient was restarted on all of his chronic medications. He was restarted on potassium chloride supplementation, titrated to achieve a normal serum potassium level. Sliding scale NovoLog was ordered for treatment of diet-controlled diabetes. For further evaluation, number studies were ordered. His troponin I was negative 4. His follow-up serum potassium improved to 3.6. His magnesium level was within normal limits. His hemoglobin A1c was pending at the time of discharge.  Patient improved clinically and symptomatically. He remained hemodynamically  stable. He had no complaints of chest pain during the hospitalization. Given his cardiac history, he was prescribed Imdur at 15 mg daily at the time of discharge. He was also instructed to take a higher dose of potassium chloride-20 mEq daily instead of 10 mEq he had been taking. He was also instructed to take Pepcid 20 g daily for possible gastroesophageal reflux symptoms. Patient was instructed to follow-up with his PCP and cardiologist in the next week or 2. He voiced understanding.  Procedures:  None  Consultations:  None  Discharge Exam: Filed Vitals:   05/22/16 2202 05/23/16 0643  BP: 128/72 128/84  Pulse: 60 60  Temp: 97.9 F (36.6 C) 97.7 F (36.5 C)  Resp: 16 16  Oxygen saturation 93% on room air.  General: Pleasant alert 82 year old man in no acute distress. Cardiovascular: S1, S2, with a soft systolic murmur. Respiratory: Clear to auscultation bilaterally.  Discharge Instructions   Discharge Instructions    Diet - low sodium heart healthy    Complete by:  As directed      Diet Carb Modified    Complete by:  As directed      Increase activity slowly    Complete by:  As directed           Current Discharge Medication List    START taking these medications   Details  famotidine (PEPCID) 20 MG tablet Take 1 tablet (20 mg total) by mouth daily.    isosorbide mononitrate (IMDUR) 30 MG 24 hr tablet Take 0.5 tablets (15 mg total) by mouth daily. Long acting nitrogylceride medication Qty: 15 tablet, Refills: 2      CONTINUE these medications which have CHANGED   Details  potassium chloride SA (K-DUR,KLOR-CON) 20  MEQ tablet Take 1 tablet (20 mEq total) by mouth daily. Qty: 30 tablet, Refills: 2      CONTINUE these medications which have NOT CHANGED   Details  acetaminophen (TYLENOL) 500 MG tablet Take 500 mg by mouth every 6 (six) hours as needed for pain.    alendronate (FOSAMAX) 70 MG tablet Take 70 mg by mouth every 7 (seven) days. Take with a full glass  of water on an empty stomach. On Mondays    amLODipine (NORVASC) 10 MG tablet Take 1 tablet (10 mg total) by mouth daily. Qty: 90 tablet, Refills: 1    apixaban (ELIQUIS) 5 MG TABS tablet Take 1 tablet (5 mg total) by mouth 2 (two) times daily. Qty: 60 tablet, Refills: 12    Calcium Carbonate-Vitamin D (CALCIUM 600 + D PO) Take 600 mg by mouth 2 (two) times daily.    carvedilol (COREG) 25 MG tablet TAKE 1 & 1/2 TABLETS BY MOUTH TWICE DAILY Qty: 270 tablet, Refills: 1    COMBIGAN 0.2-0.5 % ophthalmic solution Place 1 drop into both eyes 2 (two) times daily.    dorzolamide (TRUSOPT) 2 % ophthalmic solution Place 1 drop into both eyes 3 (three) times daily.    enalapril (VASOTEC) 20 MG tablet TAKE 1 TABLET BY MOUTH 2 TIMES DAILY. Qty: 60 tablet, Refills: 6    fish oil-omega-3 fatty acids 1000 MG capsule Take 1 g by mouth 2 (two) times daily.      furosemide (LASIX) 20 MG tablet Take 3 tablets (60 mg total) by mouth daily. Qty: 270 tablet, Refills: 1    Multiple Vitamin (MULTIVITAMIN WITH MINERALS) TABS tablet Take 1 tablet by mouth daily.    nitroGLYCERIN (NITROSTAT) 0.4 MG SL tablet Place 1 tablet (0.4 mg total) under the tongue every 5 (five) minutes x 3 doses as needed for chest pain. Qty: 25 tablet, Refills: 3    NON FORMULARY Uses 3-4 times daily. 3% amantadine 3% carbamazepine 6% Gabapentin 0.5%Pentoxifylline 3% compounded preparation    oxybutynin (DITROPAN) 5 MG tablet Take 5 mg by mouth daily.    pravastatin (PRAVACHOL) 20 MG tablet Take 1 tablet (20 mg total) by mouth every evening. Qty: 90 tablet, Refills: 1    tamsulosin (FLOMAX) 0.4 MG CAPS capsule Take 0.4 mg by mouth daily.     traMADol (ULTRAM) 50 MG tablet Take 50 mg by mouth every 6 (six) hours as needed.        No Known Allergies Follow-up Information    Follow up with Carlyle Dolly, MD. Schedule an appointment as soon as possible for a visit in 1 week.   Specialty:  Cardiology   Contact information:    [address] [place] [address] [phone]       Follow up with Glenda Chroman, MD. Schedule an appointment as soon as possible for a visit in 2 weeks.   Specialty:  Internal Medicine   Contact information:   [place] [address] [phone]        The results of significant diagnostics from this hospitalization (including imaging, microbiology, ancillary and laboratory) are listed below for reference.    Significant Diagnostic Studies: Dg Chest 2 View  05/22/2016  CLINICAL DATA:  Chest pain today. EXAM: CHEST  2 VIEW COMPARISON:  05/20/2016. FINDINGS: The cardiac silhouette remains mildly enlarged. The aorta remains tortuous. Stable left subclavian pacemaker leads. Stable mild linear scarring at the left lung base. Otherwise, clear lungs. The hemidiaphragms remain flattened. Diffuse osteopenia.  Thoracic spine degenerative changes. IMPRESSION: 1. No acute abnormality. 2. Stable mild cardiomegaly and mild changes of COPD. Electronically Signed   By: Claudie Revering M.D.   On: 05/22/2016 12:52   Dg Chest 2 View  05/20/2016  CLINICAL DATA:  Shortness of breath.  Hypertension. EXAM: CHEST  2 VIEW COMPARISON:  09/14/2013. FINDINGS: Cardiac pacer with lead tips in right atrium right ventricle. Cardiomegaly. No pulmonary venous congestion. No focal pulmonary infiltrate. Low lung volumes with mild bibasilar atelectasis and/or scarring. No pleural effusion or pneumothorax . IMPRESSION: 1. Cardiac pacer with lead tips in right atrium right ventricle. Cardiomegaly with normal pulmonary vascularity. 2. Low lung volumes with mild bibasilar atelectasis and/or scarring. Electronically Signed   By: Marcello Moores  Register   On: 05/20/2016 12:36    Microbiology: No results found for this or any previous visit (from the past 240 hour(s)).   Labs: Basic Metabolic Panel:  Recent Labs Lab 05/22/16 1136 05/22/16 1712 05/23/16 0746  NA 140  --  138  K 3.4*  --  3.6  CL 106  --  105  CO2 27   --  26  GLUCOSE 131*  --  130*  BUN 26*  --  23*  CREATININE 1.17  --  1.07  CALCIUM 9.7  --  8.9  MG  --  1.9  --    Liver Function Tests:  Recent Labs Lab 05/22/16 1136  AST 19  ALT 18  ALKPHOS 54  BILITOT 0.8  PROT 7.5  ALBUMIN 4.1   No results for input(s): LIPASE, AMYLASE in the last 168 hours. No results for input(s): AMMONIA in the last 168 hours. CBC:  Recent Labs Lab 05/22/16 1136  WBC 6.9  NEUTROABS 4.5  HGB 14.1  HCT 40.3  MCV 91.0  PLT 221   Cardiac Enzymes:  Recent Labs Lab 05/22/16 1712 05/22/16 1955 05/22/16 2323  TROPONINI <0.03 <0.03 <0.03   BNP: BNP (last 3 results)  Recent Labs  05/22/16 1136  BNP 167.0*    ProBNP (last 3 results) No results for input(s): PROBNP in the last 8760 hours.  CBG:  Recent Labs Lab 05/22/16 1741 05/23/16 0604 05/23/16 0727  GLUCAP 241* 130* 137*       Signed:  Mavi Un MD.  [facility] 05/23/2016, 9:55 AM

## 2016-05-23 NOTE — Discharge Summary (Signed)
Physician Discharge Summary  [name] [id number] DOB: 29-Sep-1933 DOA: 05/22/2016  PCP: Frank Chroman, MD  Admit date: 05/22/2016 Discharge date: 05/23/2016  Time spent: Greater than 30 minutes  Recommendations for Outpatient Follow-up:  1. Recommend follow-up of the patient's serum potassium.  2. Hemoglobin A1c result was pending at the time of discharge.   Discharge Diagnoses:  1. Chest pain in a patient with CAD, myocardial infarction ruled out. 2. Hypokalemia. 3. Chronic atrial fibrillation, on Eliquis. 4. Essential hypertension. 5. Pacemaker in situ. 6. Diet-controlled diabetes mellitus.   Discharge Condition: Improved  Diet recommendation: Heart healthy/carbohydrate modified  Filed Weights   05/22/16 1106 05/22/16 1556  Weight: 92.987 kg (205 lb) 91.989 kg (202 lb 12.8 oz)    History of present illness:  Patient is an 82 year old man with a history of CAD, hypertension, diet-controlled diabetes mellitus, pacemaker in situ, atrial fibrillation, who presented to the ED on 05/22/2016 with a chief complaint of chest pain. In the ED, he was afebrile and hemodynamically stable. His EKG revealed a paced rhythm. His chest x-ray revealed no acute abnormalities. History [name] 9 was negative. His potassium was 3.4. His BMP was only 167. His glucose was 131. He was admitted for further evaluation and management.  Hospital Course:  The patient was restarted on all of his chronic medications. He was restarted on potassium chloride supplementation, titrated to achieve a normal serum potassium level. Sliding scale NovoLog was ordered for treatment of diet-controlled diabetes. For further evaluation, number studies were ordered. His troponin I was negative 4. His follow-up serum potassium improved to 3.6. His magnesium level was within normal limits. His hemoglobin A1c was pending at the time of discharge.  Patient improved clinically and symptomatically. He remained hemodynamically  stable. He had no complaints of chest pain during the hospitalization. Given his cardiac history, he was prescribed Imdur at 15 mg daily at the time of discharge. He was also instructed to take a higher dose of potassium chloride-20 mEq daily instead of 10 mEq he had been taking. He was also instructed to take Pepcid 20 g daily for possible gastroesophageal reflux symptoms. Patient was instructed to follow-up with his PCP and cardiologist in the next week or 2. He voiced understanding.   Procedures:  None  Consultations:  None  Discharge Exam: Filed Vitals:   05/22/16 2202 05/23/16 0643  BP: 128/72 128/84  Pulse: 60 60  Temp: 97.9 F (36.6 C) 97.7 F (36.5 C)  Resp: 16 16  Oxygen saturation 93% on room air General: Pleasant alert 82 year old man in no acute distress. Cardiovascular: S1, S2, with a soft systolic murmur. Respiratory: Clear to auscultation bilaterally.  Discharge Instructions   Discharge Instructions    Diet - low sodium heart healthy    Complete by:  As directed      Diet Carb Modified    Complete by:  As directed      Increase activity slowly    Complete by:  As directed           Discharge Medication List as of 05/23/2016 10:41 AM    START taking these medications   Details  famotidine (PEPCID) 20 MG tablet Take 1 tablet (20 mg total) by mouth daily., Starting 05/23/2016, Until Discontinued, OTC    isosorbide mononitrate (IMDUR) 30 MG 24 hr tablet Take 0.5 tablets (15 mg total) by mouth daily. Long acting nitrogylceride medication, Starting 05/23/2016, Until Discontinued, Print      CONTINUE these medications which have CHANGED  Details  potassium chloride SA (K-DUR,KLOR-CON) 20 MEQ tablet Take 1 tablet (20 mEq total) by mouth daily., Starting 05/23/2016, Until Discontinued, Print      CONTINUE these medications which have NOT CHANGED   Details  acetaminophen (TYLENOL) 500 MG tablet Take 500 mg by mouth every 6 (six) hours as needed for pain., Until  Discontinued, Historical Med    alendronate (FOSAMAX) 70 MG tablet Take 70 mg by mouth every 7 (seven) days. Take with a full glass of water on an empty stomach. On Mondays, Until Discontinued, Historical Med    amLODipine (NORVASC) 10 MG tablet Take 1 tablet (10 mg total) by mouth daily., Starting 05/09/2016, Until Discontinued, Normal    apixaban (ELIQUIS) 5 MG TABS tablet Take 1 tablet (5 mg total) by mouth 2 (two) times daily., Starting 04/27/2016, Until Discontinued, Normal    Calcium Carbonate-Vitamin D (CALCIUM 600 + D PO) Take 600 mg by mouth 2 (two) times daily., Until Discontinued, Historical Med    carvedilol (COREG) 25 MG tablet TAKE 1 & 1/2 TABLETS BY MOUTH TWICE DAILY, Normal    COMBIGAN 0.2-0.5 % ophthalmic solution Place 1 drop into both eyes 2 (two) times daily., Starting 05/04/2016, Until Discontinued, Historical Med    dorzolamide (TRUSOPT) 2 % ophthalmic solution Place 1 drop into both eyes 3 (three) times daily., Starting 05/12/2016, Until Discontinued, Historical Med    enalapril (VASOTEC) 20 MG tablet TAKE 1 TABLET BY MOUTH 2 TIMES DAILY., Normal    fish oil-omega-3 fatty acids 1000 MG capsule Take 1 g by mouth 2 (two) times daily.  , Until Discontinued, Historical Med    furosemide (LASIX) 20 MG tablet Take 3 tablets (60 mg total) by mouth daily., Starting 05/09/2016, Until Discontinued, Normal    Multiple Vitamin (MULTIVITAMIN WITH MINERALS) TABS tablet Take 1 tablet by mouth daily., Until Discontinued, Historical Med    nitroGLYCERIN (NITROSTAT) 0.4 MG SL tablet Place 1 tablet (0.4 mg total) under the tongue every 5 (five) minutes x 3 doses as needed for chest pain., Starting 02/18/2015, Until Discontinued, Normal    NON FORMULARY Uses 3-4 times daily. 3% amantadine 3% carbamazepine 6% Gabapentin 0.5%Pentoxifylline 3% compounded preparation, Until Discontinued, Historical Med    oxybutynin (DITROPAN) 5 MG tablet Take 5 mg by mouth daily., Until Discontinued, Historical  Med    pravastatin (PRAVACHOL) 20 MG tablet Take 1 tablet (20 mg total) by mouth every evening., Starting 05/09/2016, Until Discontinued, Normal    tamsulosin (FLOMAX) 0.4 MG CAPS capsule Take 0.4 mg by mouth daily. , Starting 04/30/2015, Until Discontinued, Historical Med    traMADol (ULTRAM) 50 MG tablet Take 50 mg by mouth every 6 (six) hours as needed. , Until Discontinued, Historical Med       No Known Allergies Follow-up Information    Follow up with Carlyle Dolly, MD. Schedule an appointment as soon as possible for a visit in 1 week.   Specialty:  Cardiology   Contact information:   [address] [place] [address] [phone]       Follow up with Frank Chroman, MD. Schedule an appointment as soon as possible for a visit in 2 weeks.   Specialty:  Internal Medicine   Contact information:   [place] [address] [phone]        The results of significant diagnostics from this hospitalization (including imaging, microbiology, ancillary and laboratory) are listed below for reference.    Significant Diagnostic Studies: Dg Chest 2 View  05/22/2016  CLINICAL DATA:  Chest pain today. EXAM: CHEST  2 VIEW COMPARISON:  05/20/2016. FINDINGS: The cardiac silhouette remains mildly enlarged. The aorta remains tortuous. Stable left subclavian pacemaker leads. Stable mild linear scarring at the left lung base. Otherwise, clear lungs. The hemidiaphragms remain flattened. Diffuse osteopenia. Thoracic spine degenerative changes. IMPRESSION: 1. No acute abnormality. 2. Stable mild cardiomegaly and mild changes of COPD. Electronically Signed   By: Claudie Revering M.D.   On: 05/22/2016 12:52   Dg Chest 2 View  05/20/2016  CLINICAL DATA:  Shortness of breath.  Hypertension. EXAM: CHEST  2 VIEW COMPARISON:  09/14/2013. FINDINGS: Cardiac pacer with lead tips in right atrium right ventricle. Cardiomegaly. No pulmonary venous congestion. No focal pulmonary infiltrate. Low lung volumes  with mild bibasilar atelectasis and/or scarring. No pleural effusion or pneumothorax . IMPRESSION: 1. Cardiac pacer with lead tips in right atrium right ventricle. Cardiomegaly with normal pulmonary vascularity. 2. Low lung volumes with mild bibasilar atelectasis and/or scarring. Electronically Signed   By: Marcello Moores  Register   On: 05/20/2016 12:36    Microbiology: No results found for this or any previous visit (from the past 240 hour(s)).   Labs: Basic Metabolic Panel:  Recent Labs Lab 05/22/16 1136 05/22/16 1712 05/23/16 0746  NA 140  --  138  K 3.4*  --  3.6  CL 106  --  105  CO2 27  --  26  GLUCOSE 131*  --  130*  BUN 26*  --  23*  CREATININE 1.17  --  1.07  CALCIUM 9.7  --  8.9  MG  --  1.9  --    Liver Function Tests:  Recent Labs Lab 05/22/16 1136  AST 19  ALT 18  ALKPHOS 54  BILITOT 0.8  PROT 7.5  ALBUMIN 4.1   No results for input(s): LIPASE, AMYLASE in the last 168 hours. No results for input(s): AMMONIA in the last 168 hours. CBC:  Recent Labs Lab 05/22/16 1136  WBC 6.9  NEUTROABS 4.5  HGB 14.1  HCT 40.3  MCV 91.0  PLT 221   Cardiac Enzymes:  Recent Labs Lab 05/22/16 1712 05/22/16 1955 05/22/16 2323  TROPONINI <0.03 <0.03 <0.03   BNP: BNP (last 3 results)  Recent Labs  05/22/16 1136  BNP 167.0*    ProBNP (last 3 results) No results for input(s): PROBNP in the last 8760 hours.  CBG:  Recent Labs Lab 05/22/16 1741 05/23/16 0604 05/23/16 0727  GLUCAP 241* 130* 137*       Signed:  Indya Oliveria MD.  [facility] 05/23/2016, 4:26 PM

## 2016-05-23 NOTE — Care Management Note (Signed)
Case Management Note  Patient Details  Name: [name] MRN: [id number] Date of Birth: 06-24-1933  Subjective/Objective:                  Pt admitted with chest pain. From home, ind with ADL's. Pt has PCP, transportation and no difficulty affording medications. Pt plans to return home with self care at DC.   Action/Plan: No CM needs anticipated.   Expected Discharge Date:    05/23/2016              Expected Discharge Plan:  Home/Self Care  In-House Referral:  NA  Discharge planning Services  CM Consult  Post Acute Care Choice:  NA Choice offered to:  NA  DME Arranged:    DME Agency:     HH Arranged:    HH Agency:     Status of Service:  Completed, signed off  Medicare Important Message Given:    Date Medicare IM Given:    Medicare IM give by:    Date Additional Medicare IM Given:    Additional Medicare Important Message give by:     If discussed at [place] of Stay Meetings, dates discussed:    Additional Comments:  Sherald Barge, RN 05/23/2016, 8:09 AM

## 2016-05-23 NOTE — Progress Notes (Signed)
Patient discharged with instructions, prescription, and care notes.  Verbalized understanding via teach back.  IV was removed and the site was WNL. Patient voiced no further complaints or concerns at the time of discharge.  Appointments scheduled per instructions.  Patient left the floor via w/c with staff and family in stable condition. 

## 2016-05-24 DIAGNOSIS — H34232 Retinal artery branch occlusion, left eye: Secondary | ICD-10-CM | POA: Diagnosis not present

## 2016-05-24 DIAGNOSIS — Z961 Presence of intraocular lens: Secondary | ICD-10-CM | POA: Diagnosis not present

## 2016-05-24 DIAGNOSIS — H401123 Primary open-angle glaucoma, left eye, severe stage: Secondary | ICD-10-CM | POA: Diagnosis not present

## 2016-05-24 LAB — HEMOGLOBIN A1C
HEMOGLOBIN A1C: 7.1 % — AB (ref 4.8–5.6)
MEAN PLASMA GLUCOSE: 157 mg/dL

## 2016-05-26 DIAGNOSIS — E785 Hyperlipidemia, unspecified: Secondary | ICD-10-CM | POA: Diagnosis not present

## 2016-05-26 DIAGNOSIS — Z950 Presence of cardiac pacemaker: Secondary | ICD-10-CM | POA: Diagnosis not present

## 2016-05-26 DIAGNOSIS — R262 Difficulty in walking, not elsewhere classified: Secondary | ICD-10-CM | POA: Diagnosis not present

## 2016-05-26 DIAGNOSIS — S52591A Other fractures of lower end of right radius, initial encounter for closed fracture: Secondary | ICD-10-CM | POA: Diagnosis not present

## 2016-05-26 DIAGNOSIS — N400 Enlarged prostate without lower urinary tract symptoms: Secondary | ICD-10-CM | POA: Diagnosis not present

## 2016-05-26 DIAGNOSIS — S62101A Fracture of unspecified carpal bone, right wrist, initial encounter for closed fracture: Secondary | ICD-10-CM | POA: Diagnosis not present

## 2016-05-26 DIAGNOSIS — M84333A Stress fracture, right radius, initial encounter for fracture: Secondary | ICD-10-CM | POA: Diagnosis not present

## 2016-05-26 DIAGNOSIS — S52201A Unspecified fracture of shaft of right ulna, initial encounter for closed fracture: Secondary | ICD-10-CM | POA: Diagnosis not present

## 2016-05-26 DIAGNOSIS — R279 Unspecified lack of coordination: Secondary | ICD-10-CM | POA: Diagnosis not present

## 2016-05-26 DIAGNOSIS — M25521 Pain in right elbow: Secondary | ICD-10-CM | POA: Diagnosis not present

## 2016-05-26 DIAGNOSIS — X58XXXD Exposure to other specified factors, subsequent encounter: Secondary | ICD-10-CM | POA: Diagnosis not present

## 2016-05-26 DIAGNOSIS — M25531 Pain in right wrist: Secondary | ICD-10-CM | POA: Diagnosis not present

## 2016-05-26 DIAGNOSIS — M25551 Pain in right hip: Secondary | ICD-10-CM | POA: Diagnosis not present

## 2016-05-26 DIAGNOSIS — S8001XA Contusion of right knee, initial encounter: Secondary | ICD-10-CM | POA: Diagnosis not present

## 2016-05-26 DIAGNOSIS — S52691A Other fracture of lower end of right ulna, initial encounter for closed fracture: Secondary | ICD-10-CM | POA: Diagnosis not present

## 2016-05-26 DIAGNOSIS — S72141A Displaced intertrochanteric fracture of right femur, initial encounter for closed fracture: Secondary | ICD-10-CM | POA: Diagnosis not present

## 2016-05-26 DIAGNOSIS — M6281 Muscle weakness (generalized): Secondary | ICD-10-CM | POA: Diagnosis not present

## 2016-05-26 DIAGNOSIS — S72101A Unspecified trochanteric fracture of right femur, initial encounter for closed fracture: Secondary | ICD-10-CM | POA: Diagnosis not present

## 2016-05-26 DIAGNOSIS — R079 Chest pain, unspecified: Secondary | ICD-10-CM | POA: Diagnosis not present

## 2016-05-26 DIAGNOSIS — S52201D Unspecified fracture of shaft of right ulna, subsequent encounter for closed fracture with routine healing: Secondary | ICD-10-CM | POA: Diagnosis not present

## 2016-05-26 DIAGNOSIS — T148 Other injury of unspecified body region: Secondary | ICD-10-CM | POA: Diagnosis not present

## 2016-05-26 DIAGNOSIS — S52601A Unspecified fracture of lower end of right ulna, initial encounter for closed fracture: Secondary | ICD-10-CM | POA: Diagnosis present

## 2016-05-26 DIAGNOSIS — S72144A Nondisplaced intertrochanteric fracture of right femur, initial encounter for closed fracture: Secondary | ICD-10-CM | POA: Diagnosis not present

## 2016-05-26 DIAGNOSIS — M810 Age-related osteoporosis without current pathological fracture: Secondary | ICD-10-CM | POA: Diagnosis not present

## 2016-05-26 DIAGNOSIS — G4730 Sleep apnea, unspecified: Secondary | ICD-10-CM | POA: Diagnosis not present

## 2016-05-26 DIAGNOSIS — Z7401 Bed confinement status: Secondary | ICD-10-CM | POA: Diagnosis not present

## 2016-05-26 DIAGNOSIS — M79606 Pain in leg, unspecified: Secondary | ICD-10-CM | POA: Diagnosis not present

## 2016-05-26 DIAGNOSIS — E1140 Type 2 diabetes mellitus with diabetic neuropathy, unspecified: Secondary | ICD-10-CM | POA: Diagnosis not present

## 2016-05-26 DIAGNOSIS — S8991XA Unspecified injury of right lower leg, initial encounter: Secondary | ICD-10-CM | POA: Diagnosis not present

## 2016-05-26 DIAGNOSIS — S7291XD Unspecified fracture of right femur, subsequent encounter for closed fracture with routine healing: Secondary | ICD-10-CM | POA: Diagnosis not present

## 2016-05-26 DIAGNOSIS — S5291XA Unspecified fracture of right forearm, initial encounter for closed fracture: Secondary | ICD-10-CM | POA: Diagnosis not present

## 2016-05-26 DIAGNOSIS — H409 Unspecified glaucoma: Secondary | ICD-10-CM | POA: Diagnosis not present

## 2016-05-26 DIAGNOSIS — W19XXXA Unspecified fall, initial encounter: Secondary | ICD-10-CM | POA: Diagnosis not present

## 2016-05-26 DIAGNOSIS — I10 Essential (primary) hypertension: Secondary | ICD-10-CM | POA: Diagnosis not present

## 2016-05-26 DIAGNOSIS — S59901A Unspecified injury of right elbow, initial encounter: Secondary | ICD-10-CM | POA: Diagnosis not present

## 2016-05-26 DIAGNOSIS — S72001A Fracture of unspecified part of neck of right femur, initial encounter for closed fracture: Secondary | ICD-10-CM | POA: Diagnosis not present

## 2016-05-26 DIAGNOSIS — S5291XD Unspecified fracture of right forearm, subsequent encounter for closed fracture with routine healing: Secondary | ICD-10-CM | POA: Diagnosis not present

## 2016-05-26 DIAGNOSIS — S299XXA Unspecified injury of thorax, initial encounter: Secondary | ICD-10-CM | POA: Diagnosis not present

## 2016-05-26 DIAGNOSIS — S7221XA Displaced subtrochanteric fracture of right femur, initial encounter for closed fracture: Secondary | ICD-10-CM | POA: Diagnosis not present

## 2016-05-26 DIAGNOSIS — W010XXA Fall on same level from slipping, tripping and stumbling without subsequent striking against object, initial encounter: Secondary | ICD-10-CM | POA: Diagnosis not present

## 2016-05-26 DIAGNOSIS — S5001XA Contusion of right elbow, initial encounter: Secondary | ICD-10-CM | POA: Diagnosis not present

## 2016-05-26 HISTORY — PX: OTHER SURGICAL HISTORY: SHX169

## 2016-06-01 ENCOUNTER — Telehealth: Payer: Self-pay | Admitting: Internal Medicine

## 2016-06-01 ENCOUNTER — Encounter: Payer: Medicare Other | Admitting: Physician Assistant

## 2016-06-01 NOTE — Telephone Encounter (Signed)
Result Notes     Notes Recorded by Lorane Gell, CMA on 05/31/2016 at 4:13 PM LMTCB ------  Notes Recorded by Deneise Lever, MD on 05/20/2016 at 3:06 PM CXR- lungs are shallow, with a little crowding in the bases. Heart is enlarged with pacemaker in position, no heart failure.   Spoke with pt's wife. She is aware of pt's results. States that he is currently in the hospital with a broken hip and ribs. Home sleep study needs to be canceled at this time.

## 2016-06-02 DIAGNOSIS — S52201D Unspecified fracture of shaft of right ulna, subsequent encounter for closed fracture with routine healing: Secondary | ICD-10-CM | POA: Diagnosis not present

## 2016-06-02 DIAGNOSIS — I10 Essential (primary) hypertension: Secondary | ICD-10-CM | POA: Diagnosis not present

## 2016-06-02 DIAGNOSIS — I951 Orthostatic hypotension: Secondary | ICD-10-CM | POA: Diagnosis not present

## 2016-06-02 DIAGNOSIS — E860 Dehydration: Secondary | ICD-10-CM | POA: Diagnosis not present

## 2016-06-02 DIAGNOSIS — W19XXXA Unspecified fall, initial encounter: Secondary | ICD-10-CM | POA: Diagnosis not present

## 2016-06-02 DIAGNOSIS — I959 Hypotension, unspecified: Secondary | ICD-10-CM | POA: Diagnosis not present

## 2016-06-02 DIAGNOSIS — E785 Hyperlipidemia, unspecified: Secondary | ICD-10-CM | POA: Diagnosis not present

## 2016-06-02 DIAGNOSIS — R55 Syncope and collapse: Secondary | ICD-10-CM | POA: Diagnosis not present

## 2016-06-02 DIAGNOSIS — R262 Difficulty in walking, not elsewhere classified: Secondary | ICD-10-CM | POA: Diagnosis not present

## 2016-06-02 DIAGNOSIS — M810 Age-related osteoporosis without current pathological fracture: Secondary | ICD-10-CM | POA: Diagnosis not present

## 2016-06-02 DIAGNOSIS — X58XXXD Exposure to other specified factors, subsequent encounter: Secondary | ICD-10-CM | POA: Diagnosis not present

## 2016-06-02 DIAGNOSIS — Z950 Presence of cardiac pacemaker: Secondary | ICD-10-CM | POA: Diagnosis not present

## 2016-06-02 DIAGNOSIS — S5291XD Unspecified fracture of right forearm, subsequent encounter for closed fracture with routine healing: Secondary | ICD-10-CM | POA: Diagnosis not present

## 2016-06-02 DIAGNOSIS — Z79899 Other long term (current) drug therapy: Secondary | ICD-10-CM | POA: Diagnosis not present

## 2016-06-02 DIAGNOSIS — J9811 Atelectasis: Secondary | ICD-10-CM | POA: Diagnosis not present

## 2016-06-02 DIAGNOSIS — G4730 Sleep apnea, unspecified: Secondary | ICD-10-CM | POA: Diagnosis not present

## 2016-06-02 DIAGNOSIS — R279 Unspecified lack of coordination: Secondary | ICD-10-CM | POA: Diagnosis not present

## 2016-06-02 DIAGNOSIS — M6281 Muscle weakness (generalized): Secondary | ICD-10-CM | POA: Diagnosis not present

## 2016-06-02 DIAGNOSIS — I2699 Other pulmonary embolism without acute cor pulmonale: Secondary | ICD-10-CM | POA: Diagnosis not present

## 2016-06-02 DIAGNOSIS — Z7902 Long term (current) use of antithrombotics/antiplatelets: Secondary | ICD-10-CM | POA: Diagnosis not present

## 2016-06-02 DIAGNOSIS — S7291XD Unspecified fracture of right femur, subsequent encounter for closed fracture with routine healing: Secondary | ICD-10-CM | POA: Diagnosis not present

## 2016-06-02 DIAGNOSIS — H409 Unspecified glaucoma: Secondary | ICD-10-CM | POA: Diagnosis not present

## 2016-06-02 DIAGNOSIS — Z7401 Bed confinement status: Secondary | ICD-10-CM | POA: Diagnosis not present

## 2016-06-02 DIAGNOSIS — S72141A Displaced intertrochanteric fracture of right femur, initial encounter for closed fracture: Secondary | ICD-10-CM | POA: Diagnosis not present

## 2016-06-02 DIAGNOSIS — R748 Abnormal levels of other serum enzymes: Secondary | ICD-10-CM | POA: Diagnosis not present

## 2016-06-02 DIAGNOSIS — E1140 Type 2 diabetes mellitus with diabetic neuropathy, unspecified: Secondary | ICD-10-CM | POA: Diagnosis not present

## 2016-06-02 DIAGNOSIS — N400 Enlarged prostate without lower urinary tract symptoms: Secondary | ICD-10-CM | POA: Diagnosis not present

## 2016-06-02 DIAGNOSIS — E049 Nontoxic goiter, unspecified: Secondary | ICD-10-CM | POA: Diagnosis not present

## 2016-06-02 DIAGNOSIS — M84333A Stress fracture, right radius, initial encounter for fracture: Secondary | ICD-10-CM | POA: Diagnosis not present

## 2016-06-04 DIAGNOSIS — S72141A Displaced intertrochanteric fracture of right femur, initial encounter for closed fracture: Secondary | ICD-10-CM | POA: Diagnosis not present

## 2016-06-04 DIAGNOSIS — I10 Essential (primary) hypertension: Secondary | ICD-10-CM | POA: Diagnosis not present

## 2016-06-04 DIAGNOSIS — W19XXXA Unspecified fall, initial encounter: Secondary | ICD-10-CM | POA: Diagnosis not present

## 2016-06-07 ENCOUNTER — Ambulatory Visit: Payer: Medicare Other | Admitting: Cardiology

## 2016-06-09 ENCOUNTER — Telehealth: Payer: Self-pay | Admitting: Cardiology

## 2016-06-09 ENCOUNTER — Telehealth: Payer: Self-pay | Admitting: Internal Medicine

## 2016-06-09 DIAGNOSIS — Z950 Presence of cardiac pacemaker: Secondary | ICD-10-CM | POA: Diagnosis not present

## 2016-06-09 DIAGNOSIS — J9811 Atelectasis: Secondary | ICD-10-CM | POA: Diagnosis not present

## 2016-06-09 DIAGNOSIS — R748 Abnormal levels of other serum enzymes: Secondary | ICD-10-CM | POA: Diagnosis not present

## 2016-06-09 DIAGNOSIS — E049 Nontoxic goiter, unspecified: Secondary | ICD-10-CM | POA: Diagnosis not present

## 2016-06-09 DIAGNOSIS — E860 Dehydration: Secondary | ICD-10-CM | POA: Diagnosis not present

## 2016-06-09 DIAGNOSIS — I2699 Other pulmonary embolism without acute cor pulmonale: Secondary | ICD-10-CM | POA: Diagnosis not present

## 2016-06-09 DIAGNOSIS — I951 Orthostatic hypotension: Secondary | ICD-10-CM | POA: Diagnosis not present

## 2016-06-09 DIAGNOSIS — R55 Syncope and collapse: Secondary | ICD-10-CM | POA: Diagnosis not present

## 2016-06-09 DIAGNOSIS — M810 Age-related osteoporosis without current pathological fracture: Secondary | ICD-10-CM | POA: Diagnosis not present

## 2016-06-09 DIAGNOSIS — I10 Essential (primary) hypertension: Secondary | ICD-10-CM | POA: Diagnosis not present

## 2016-06-09 NOTE — Telephone Encounter (Signed)
Spoke with Tammy at [facility]. I recommended that they call 911 to have the patient evaluated in the ER based on the symptoms described. I would not recommend waiting on the wife to bring the monitor. She says she agrees and is waiting on a call back from Mr. Lillia Corporal doctor for his recommendations. I told her that I would be happy to review a transmission if they got the monitor quickly but I'm not sure that I would change my recommendations regardless of findings on transmission. She verbalizes understanding and is appreciative.

## 2016-06-09 NOTE — Telephone Encounter (Signed)
Pt nurse at [facility] called and stated that pt will be sitting there fine one minute and then out of it the next minute. Pt reported to nurse that right before it happens he feels a lot of pressure on his chest and really dizzy. She stated that when she listens to his apical pulse she can hear real slow heart beats and then it will speed up and an occasional skipped beat. Pt is not short of breath. His O2 stats have been 92-98 %. She stated that pt BP has been as low as 62/40 in the last 24 hours. Nurse is going to get wife to bring home monitor to the facility so she can send a remote transmission. I explained to her that a device tech RN may call back and request that pt be evaluated at ER sooner. Gave her the number to [facility] tech support in case the office is closed before she gets the monitor. Pt nurse verbalized understanding. Pt nurse name is Mcarthur Rossetti. At [phone]. Pt is only at the facility for rehab.

## 2016-06-09 NOTE — Telephone Encounter (Signed)
Did not need this encounter,caller was connected to [facility] in [place].

## 2016-06-10 DIAGNOSIS — S72009A Fracture of unspecified part of neck of unspecified femur, initial encounter for closed fracture: Secondary | ICD-10-CM | POA: Diagnosis not present

## 2016-06-10 DIAGNOSIS — M6281 Muscle weakness (generalized): Secondary | ICD-10-CM | POA: Diagnosis not present

## 2016-06-10 DIAGNOSIS — I951 Orthostatic hypotension: Secondary | ICD-10-CM | POA: Diagnosis not present

## 2016-06-10 DIAGNOSIS — R262 Difficulty in walking, not elsewhere classified: Secondary | ICD-10-CM | POA: Diagnosis not present

## 2016-06-10 DIAGNOSIS — S52201A Unspecified fracture of shaft of right ulna, initial encounter for closed fracture: Secondary | ICD-10-CM | POA: Diagnosis not present

## 2016-06-10 DIAGNOSIS — M160 Bilateral primary osteoarthritis of hip: Secondary | ICD-10-CM | POA: Diagnosis not present

## 2016-06-10 DIAGNOSIS — X58XXXD Exposure to other specified factors, subsequent encounter: Secondary | ICD-10-CM | POA: Diagnosis not present

## 2016-06-10 DIAGNOSIS — S7291XD Unspecified fracture of right femur, subsequent encounter for closed fracture with routine healing: Secondary | ICD-10-CM | POA: Diagnosis not present

## 2016-06-10 DIAGNOSIS — I10 Essential (primary) hypertension: Secondary | ICD-10-CM | POA: Diagnosis not present

## 2016-06-10 DIAGNOSIS — S52591A Other fractures of lower end of right radius, initial encounter for closed fracture: Secondary | ICD-10-CM | POA: Diagnosis not present

## 2016-06-10 DIAGNOSIS — H409 Unspecified glaucoma: Secondary | ICD-10-CM | POA: Diagnosis not present

## 2016-06-10 DIAGNOSIS — E785 Hyperlipidemia, unspecified: Secondary | ICD-10-CM | POA: Diagnosis not present

## 2016-06-10 DIAGNOSIS — W19XXXA Unspecified fall, initial encounter: Secondary | ICD-10-CM | POA: Diagnosis not present

## 2016-06-10 DIAGNOSIS — G4730 Sleep apnea, unspecified: Secondary | ICD-10-CM | POA: Diagnosis not present

## 2016-06-10 DIAGNOSIS — S5291XA Unspecified fracture of right forearm, initial encounter for closed fracture: Secondary | ICD-10-CM | POA: Diagnosis not present

## 2016-06-10 DIAGNOSIS — S72141A Displaced intertrochanteric fracture of right femur, initial encounter for closed fracture: Secondary | ICD-10-CM | POA: Diagnosis not present

## 2016-06-10 DIAGNOSIS — R278 Other lack of coordination: Secondary | ICD-10-CM | POA: Diagnosis not present

## 2016-06-10 DIAGNOSIS — N400 Enlarged prostate without lower urinary tract symptoms: Secondary | ICD-10-CM | POA: Diagnosis not present

## 2016-06-10 DIAGNOSIS — E860 Dehydration: Secondary | ICD-10-CM | POA: Diagnosis not present

## 2016-06-10 DIAGNOSIS — E1140 Type 2 diabetes mellitus with diabetic neuropathy, unspecified: Secondary | ICD-10-CM | POA: Diagnosis not present

## 2016-06-10 DIAGNOSIS — I2699 Other pulmonary embolism without acute cor pulmonale: Secondary | ICD-10-CM | POA: Diagnosis not present

## 2016-06-10 DIAGNOSIS — Z9889 Other specified postprocedural states: Secondary | ICD-10-CM | POA: Diagnosis not present

## 2016-06-10 DIAGNOSIS — M810 Age-related osteoporosis without current pathological fracture: Secondary | ICD-10-CM | POA: Diagnosis not present

## 2016-06-10 DIAGNOSIS — M19031 Primary osteoarthritis, right wrist: Secondary | ICD-10-CM | POA: Diagnosis not present

## 2016-06-10 DIAGNOSIS — S52201D Unspecified fracture of shaft of right ulna, subsequent encounter for closed fracture with routine healing: Secondary | ICD-10-CM | POA: Diagnosis not present

## 2016-06-10 DIAGNOSIS — S5291XD Unspecified fracture of right forearm, subsequent encounter for closed fracture with routine healing: Secondary | ICD-10-CM | POA: Diagnosis not present

## 2016-06-10 DIAGNOSIS — Z950 Presence of cardiac pacemaker: Secondary | ICD-10-CM | POA: Diagnosis not present

## 2016-06-10 NOTE — Progress Notes (Signed)
Quick Note:  LMTCB ______ 

## 2016-06-13 DIAGNOSIS — S72141A Displaced intertrochanteric fracture of right femur, initial encounter for closed fracture: Secondary | ICD-10-CM | POA: Diagnosis not present

## 2016-06-13 DIAGNOSIS — I10 Essential (primary) hypertension: Secondary | ICD-10-CM | POA: Diagnosis not present

## 2016-06-13 DIAGNOSIS — W19XXXA Unspecified fall, initial encounter: Secondary | ICD-10-CM | POA: Diagnosis not present

## 2016-06-14 ENCOUNTER — Ambulatory Visit: Payer: Medicare Other | Admitting: Cardiology

## 2016-06-15 ENCOUNTER — Telehealth: Payer: Self-pay | Admitting: Cardiology

## 2016-06-15 ENCOUNTER — Encounter: Payer: Medicare Other | Admitting: *Deleted

## 2016-06-15 NOTE — Telephone Encounter (Signed)
LMOVM reminding pt to send remote transmission.   

## 2016-06-16 ENCOUNTER — Encounter (INDEPENDENT_AMBULATORY_CARE_PROVIDER_SITE_OTHER): Payer: Medicare Other | Admitting: Ophthalmology

## 2016-06-17 ENCOUNTER — Encounter: Payer: Self-pay | Admitting: Cardiology

## 2016-06-21 ENCOUNTER — Ambulatory Visit: Payer: Medicare Other | Admitting: Internal Medicine

## 2016-06-21 DIAGNOSIS — S72141A Displaced intertrochanteric fracture of right femur, initial encounter for closed fracture: Secondary | ICD-10-CM | POA: Diagnosis not present

## 2016-06-21 DIAGNOSIS — S5291XA Unspecified fracture of right forearm, initial encounter for closed fracture: Secondary | ICD-10-CM | POA: Diagnosis not present

## 2016-06-21 DIAGNOSIS — S52591A Other fractures of lower end of right radius, initial encounter for closed fracture: Secondary | ICD-10-CM | POA: Diagnosis not present

## 2016-06-21 DIAGNOSIS — M19031 Primary osteoarthritis, right wrist: Secondary | ICD-10-CM | POA: Diagnosis not present

## 2016-06-21 DIAGNOSIS — Z9889 Other specified postprocedural states: Secondary | ICD-10-CM | POA: Diagnosis not present

## 2016-06-21 DIAGNOSIS — M160 Bilateral primary osteoarthritis of hip: Secondary | ICD-10-CM | POA: Diagnosis not present

## 2016-07-12 DIAGNOSIS — S72141A Displaced intertrochanteric fracture of right femur, initial encounter for closed fracture: Secondary | ICD-10-CM | POA: Diagnosis not present

## 2016-07-12 DIAGNOSIS — I10 Essential (primary) hypertension: Secondary | ICD-10-CM | POA: Diagnosis not present

## 2016-07-12 DIAGNOSIS — S52591A Other fractures of lower end of right radius, initial encounter for closed fracture: Secondary | ICD-10-CM | POA: Diagnosis not present

## 2016-07-12 DIAGNOSIS — W19XXXA Unspecified fall, initial encounter: Secondary | ICD-10-CM | POA: Diagnosis not present

## 2016-07-26 DIAGNOSIS — S72141A Displaced intertrochanteric fracture of right femur, initial encounter for closed fracture: Secondary | ICD-10-CM | POA: Diagnosis not present

## 2016-07-26 DIAGNOSIS — S52201A Unspecified fracture of shaft of right ulna, initial encounter for closed fracture: Secondary | ICD-10-CM | POA: Diagnosis not present

## 2016-08-09 DIAGNOSIS — I10 Essential (primary) hypertension: Secondary | ICD-10-CM | POA: Diagnosis not present

## 2016-08-09 DIAGNOSIS — S72009A Fracture of unspecified part of neck of unspecified femur, initial encounter for closed fracture: Secondary | ICD-10-CM | POA: Diagnosis not present

## 2016-08-09 DIAGNOSIS — I2699 Other pulmonary embolism without acute cor pulmonale: Secondary | ICD-10-CM | POA: Diagnosis not present

## 2016-08-10 ENCOUNTER — Ambulatory Visit (INDEPENDENT_AMBULATORY_CARE_PROVIDER_SITE_OTHER): Payer: Medicare Other | Admitting: *Deleted

## 2016-08-10 DIAGNOSIS — I495 Sick sinus syndrome: Secondary | ICD-10-CM

## 2016-08-11 ENCOUNTER — Encounter: Payer: Self-pay | Admitting: Cardiology

## 2016-08-11 NOTE — Progress Notes (Signed)
Remote pacemaker transmission.   

## 2016-08-12 LAB — CUP PACEART REMOTE DEVICE CHECK
Battery Remaining Longevity: 118 mo
Battery Voltage: 2.99 V
Brady Statistic AS VP Percent: 68 %
Brady Statistic RA Percent Paced: 32 %
Brady Statistic RV Percent Paced: 99 %
Date Time Interrogation Session: 20170816110811
Implantable Lead Implant Date: 20140919
Implantable Lead Location: 753860
Lead Channel Pacing Threshold Amplitude: 1.125 V
Lead Channel Pacing Threshold Pulse Width: 0.5 ms
Lead Channel Sensing Intrinsic Amplitude: 6.3 mV
Lead Channel Setting Pacing Amplitude: 2.125
Lead Channel Setting Sensing Sensitivity: 2 mV
MDC IDC LEAD IMPLANT DT: 20140919
MDC IDC LEAD LOCATION: 753859
MDC IDC MSMT BATTERY REMAINING PERCENTAGE: 95.5 %
MDC IDC MSMT LEADCHNL RA IMPEDANCE VALUE: 360 Ohm
MDC IDC MSMT LEADCHNL RA SENSING INTR AMPL: 4.3 mV
MDC IDC MSMT LEADCHNL RV IMPEDANCE VALUE: 410 Ohm
MDC IDC MSMT LEADCHNL RV PACING THRESHOLD AMPLITUDE: 0.75 V
MDC IDC MSMT LEADCHNL RV PACING THRESHOLD PULSEWIDTH: 0.5 ms
MDC IDC PG SERIAL: 7536055
MDC IDC SET LEADCHNL RV PACING AMPLITUDE: 1 V
MDC IDC SET LEADCHNL RV PACING PULSEWIDTH: 0.5 ms
MDC IDC STAT BRADY AP VP PERCENT: 32 %
MDC IDC STAT BRADY AP VS PERCENT: 1 %
MDC IDC STAT BRADY AS VS PERCENT: 1 %

## 2016-08-17 DIAGNOSIS — I4891 Unspecified atrial fibrillation: Secondary | ICD-10-CM | POA: Diagnosis not present

## 2016-08-17 DIAGNOSIS — I10 Essential (primary) hypertension: Secondary | ICD-10-CM | POA: Diagnosis not present

## 2016-08-17 DIAGNOSIS — I429 Cardiomyopathy, unspecified: Secondary | ICD-10-CM | POA: Diagnosis not present

## 2016-08-17 DIAGNOSIS — S72001A Fracture of unspecified part of neck of right femur, initial encounter for closed fracture: Secondary | ICD-10-CM | POA: Diagnosis not present

## 2016-08-30 DIAGNOSIS — Z961 Presence of intraocular lens: Secondary | ICD-10-CM | POA: Diagnosis not present

## 2016-08-30 DIAGNOSIS — H401123 Primary open-angle glaucoma, left eye, severe stage: Secondary | ICD-10-CM | POA: Diagnosis not present

## 2016-09-07 ENCOUNTER — Encounter (INDEPENDENT_AMBULATORY_CARE_PROVIDER_SITE_OTHER): Payer: Medicare Other | Admitting: Ophthalmology

## 2016-09-07 DIAGNOSIS — H43813 Vitreous degeneration, bilateral: Secondary | ICD-10-CM

## 2016-09-07 DIAGNOSIS — H35033 Hypertensive retinopathy, bilateral: Secondary | ICD-10-CM

## 2016-09-07 DIAGNOSIS — I10 Essential (primary) hypertension: Secondary | ICD-10-CM

## 2016-09-07 DIAGNOSIS — H34832 Tributary (branch) retinal vein occlusion, left eye, with macular edema: Secondary | ICD-10-CM | POA: Diagnosis not present

## 2016-09-13 DIAGNOSIS — M5126 Other intervertebral disc displacement, lumbar region: Secondary | ICD-10-CM | POA: Diagnosis not present

## 2016-09-13 DIAGNOSIS — M810 Age-related osteoporosis without current pathological fracture: Secondary | ICD-10-CM | POA: Diagnosis not present

## 2016-09-20 DIAGNOSIS — E1165 Type 2 diabetes mellitus with hyperglycemia: Secondary | ICD-10-CM | POA: Diagnosis not present

## 2016-10-17 ENCOUNTER — Encounter: Payer: Self-pay | Admitting: Internal Medicine

## 2016-10-17 ENCOUNTER — Ambulatory Visit (INDEPENDENT_AMBULATORY_CARE_PROVIDER_SITE_OTHER): Payer: Medicare Other | Admitting: Internal Medicine

## 2016-10-17 VITALS — BP 200/104 | HR 62 | Wt 200.0 lb

## 2016-10-17 DIAGNOSIS — I10 Essential (primary) hypertension: Secondary | ICD-10-CM

## 2016-10-17 DIAGNOSIS — Z950 Presence of cardiac pacemaker: Secondary | ICD-10-CM

## 2016-10-17 LAB — CUP PACEART INCLINIC DEVICE CHECK
Battery Remaining Longevity: 120 mo
Brady Statistic RV Percent Paced: 99.46 %
Date Time Interrogation Session: 20171023133241
Implantable Lead Implant Date: 20140919
Implantable Lead Location: 753859
Lead Channel Pacing Threshold Amplitude: 0.75 V
Lead Channel Pacing Threshold Amplitude: 0.75 V
Lead Channel Pacing Threshold Pulse Width: 0.5 ms
Lead Channel Setting Pacing Amplitude: 0.875
Lead Channel Setting Sensing Sensitivity: 2 mV
MDC IDC LEAD IMPLANT DT: 20140919
MDC IDC LEAD LOCATION: 753860
MDC IDC MSMT BATTERY VOLTAGE: 2.98 V
MDC IDC MSMT LEADCHNL RA IMPEDANCE VALUE: 375 Ohm
MDC IDC MSMT LEADCHNL RA PACING THRESHOLD PULSEWIDTH: 0.5 ms
MDC IDC MSMT LEADCHNL RA SENSING INTR AMPL: 3.1 mV
MDC IDC MSMT LEADCHNL RV IMPEDANCE VALUE: 450 Ohm
MDC IDC MSMT LEADCHNL RV SENSING INTR AMPL: 6.5 mV
MDC IDC PG SERIAL: 7536055
MDC IDC SET LEADCHNL RA PACING AMPLITUDE: 2 V
MDC IDC SET LEADCHNL RV PACING PULSEWIDTH: 0.5 ms
MDC IDC STAT BRADY RA PERCENT PACED: 42 %
Pulse Gen Model: 2240

## 2016-10-17 MED ORDER — NITROGLYCERIN 0.4 MG SL SUBL: 0.4000 mg | SUBLINGUAL_TABLET | SUBLINGUAL | 3 refills | Status: DC | PRN

## 2016-10-17 NOTE — Addendum Note (Signed)
Addended by: Levonne Hubert on: 10/17/2016 11:17 AM   Modules accepted: Orders

## 2016-10-17 NOTE — Patient Instructions (Addendum)

## 2016-10-17 NOTE — Progress Notes (Signed)
HPI [name] returns today for followup. He is a pleasant 82 yo man with a h/o symptomatic bradycardia, s/p PPM insertion, HTN, and peripheral edema. He has chronic back pain which limits his ability to ambulate. He denies dietary indiscretion.Marland Kitchen He notes that at home his blood pressure has been well controlled. He has had a fall where he broke his wrist and hip and was in rehab for 2 months. He is better. No chest pain or sob.  No Known Allergies   Current Outpatient Prescriptions  Medication Sig Dispense Refill  . acetaminophen (TYLENOL) 500 MG tablet Take 500 mg by mouth every 6 (six) hours as needed for pain.    Marland Kitchen alendronate (FOSAMAX) 70 MG tablet Take 70 mg by mouth every 7 (seven) days. Take with a full glass of water on an empty stomach. On Mondays    . apixaban (ELIQUIS) 5 MG TABS tablet Take 1 tablet (5 mg total) by mouth 2 (two) times daily. 60 tablet 12  . Calcium Carbonate-Vitamin D (CALCIUM 600 + D PO) Take 600 mg by mouth 2 (two) times daily.    . COMBIGAN 0.2-0.5 % ophthalmic solution Place 1 drop into both eyes 2 (two) times daily.    . dorzolamide (TRUSOPT) 2 % ophthalmic solution Place 1 drop into both eyes 3 (three) times daily.    . famotidine (PEPCID) 20 MG tablet Take 1 tablet (20 mg total) by mouth daily.    . fish oil-omega-3 fatty acids 1000 MG capsule Take 1 g by mouth 2 (two) times daily.      . Multiple Vitamin (MULTIVITAMIN WITH MINERALS) TABS tablet Take 1 tablet by mouth daily.    . nitroGLYCERIN (NITROSTAT) 0.4 MG SL tablet Place 1 tablet (0.4 mg total) under the tongue every 5 (five) minutes x 3 doses as needed for chest pain. 25 tablet 3  . oxybutynin (DITROPAN) 5 MG tablet Take 5 mg by mouth daily.    . pravastatin (PRAVACHOL) 20 MG tablet Take 1 tablet (20 mg total) by mouth every evening. 90 tablet 1  . tamsulosin (FLOMAX) 0.4 MG CAPS capsule Take 0.4 mg by mouth daily.     . traMADol (ULTRAM) 50 MG tablet Take 50 mg by mouth every 6 (six) hours as  needed.      No current facility-administered medications for this visit.      Past Medical History:  Diagnosis Date  . Allergic rhinitis   . Anemia   . Anxiety   . Arthritis    "all over my body" (09/10/2013)  . BPH (benign prostatic hypertrophy)   . CAD (coronary artery disease)    a. 08/2013 LM nl, LAD 27m, D1 80ost, 95p, LCX 6m, RCA 20d, EF 60%.  . Chronic lower back pain   . Depression   . Diabetes mellitus    "diet controlled" (09/10/2013)  . Diaphragmatic hernia without mention of obstruction or gangrene   . Diastolic congestive heart failure ([place])   . Elevated liver function tests   . Essential hypertension, benign   . Lacunar infarction ([place])    a. 08/2013 post-cath, MRI: sm acute lacunar infarcts in LPCA and LSCA, ? tiny RPICA lacunar infarct.  . Obesity   . OSA on CPAP    "wears mask part of the time" (09/10/2013)  . Osteoporosis   . Sinus node dysfunction ([place])    a. 08/2013 s/p SJM DC PPM.    ROS:   All systems reviewed and negative except  as noted in the HPI.   Past Surgical History:  Procedure Laterality Date  . CARDIAC CATHETERIZATION  1998  . CATARACT EXTRACTION W/ INTRAOCULAR LENS  IMPLANT, BILATERAL  2010  . LEFT AND RIGHT HEART CATHETERIZATION WITH CORONARY ANGIOGRAM N/A 09/11/2013   Procedure: LEFT AND RIGHT HEART CATHETERIZATION WITH CORONARY ANGIOGRAM;  Surgeon: Wellington Hampshire, MD;  Location: [facility] CATH LAB;  Service: Cardiovascular;  Laterality: N/A;  . PERMANENT PACEMAKER INSERTION N/A 09/13/2013   Procedure: PERMANENT PACEMAKER INSERTION;  Surgeon: Evans Lance, MD;  Location[facility] C[facility] LAB;  Service: Cardiovascular;  Laterality: N/A;  . STOMACH SURGERY  03/02/2012   ?volvus; "stomach came loose and twisted; went up under rib cage; had that corrected; tacked it up" (09/10/2013)     Family History  Problem Relation Age of Onset  . Hypertension Mother   . Hypertension Father      Social History   Social History  . Marital status: Married     Spouse name: N/A  . Number of children: 2  . Years of education: N/A   Occupational History  . 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 Smoking status: Never Smoker  . Smokeless tobacco: Never Used  . Alcohol use No  . Drug use: No  . Sexual activity: No   Other Topics Concern  . Not on file   Social History Narrative  . No narrative on file     BP (!) 200/104   Pulse 62   Wt 200 lb (90.7 kg)   SpO2 97%   BMI 25.68 kg/m  BP - 140/95 Physical Exam:  Well appearing elderly man, NAD HEENT: Unremarkable Neck:  6 cm JVD, no thyromegally Back:  No CVA tenderness Lungs:  Clear with no wheezes, rales, or rhonchi HEART:  Regular rate rhythm, no murmurs, no rubs, no clicks, soft S4. Abd:  soft, positive bowel sounds, no organomegally, no rebound, no guarding Ext:  2 plus pulses, no edema, no cyanosis, no clubbing Skin:  No rashes no nodules Neuro:  CN II through XII intact, motor grossly intact   DEVICE  Normal device function.  See PaceArt for details.   Assess/Plan: 1. PAF - he is mostly maintaining NSR. He will continue his Eliquis 2. HTN - his blood pressure is elevated but better on my recheck. I have asked him to reduce his salt intake 3. PPM - his St. Jude device is working normally.  4. Falls - he tripped and fell back in the summer but has had none since. If he has many more, will need to stop his Eliquis.  Mikle Bosworth.D.

## 2016-10-19 ENCOUNTER — Encounter (INDEPENDENT_AMBULATORY_CARE_PROVIDER_SITE_OTHER): Payer: Medicare Other | Admitting: Ophthalmology

## 2016-10-19 DIAGNOSIS — H43813 Vitreous degeneration, bilateral: Secondary | ICD-10-CM

## 2016-10-19 DIAGNOSIS — H35033 Hypertensive retinopathy, bilateral: Secondary | ICD-10-CM

## 2016-10-19 DIAGNOSIS — H34832 Tributary (branch) retinal vein occlusion, left eye, with macular edema: Secondary | ICD-10-CM | POA: Diagnosis not present

## 2016-10-19 DIAGNOSIS — I10 Essential (primary) hypertension: Secondary | ICD-10-CM

## 2016-10-25 DIAGNOSIS — H02831 Dermatochalasis of right upper eyelid: Secondary | ICD-10-CM | POA: Diagnosis not present

## 2016-10-25 DIAGNOSIS — Z961 Presence of intraocular lens: Secondary | ICD-10-CM | POA: Diagnosis not present

## 2016-10-25 DIAGNOSIS — H401123 Primary open-angle glaucoma, left eye, severe stage: Secondary | ICD-10-CM | POA: Diagnosis not present

## 2016-10-25 DIAGNOSIS — H02834 Dermatochalasis of left upper eyelid: Secondary | ICD-10-CM | POA: Diagnosis not present

## 2016-10-28 DIAGNOSIS — H401123 Primary open-angle glaucoma, left eye, severe stage: Secondary | ICD-10-CM | POA: Diagnosis not present

## 2016-11-11 DIAGNOSIS — H401123 Primary open-angle glaucoma, left eye, severe stage: Secondary | ICD-10-CM | POA: Diagnosis not present

## 2016-11-11 DIAGNOSIS — Z961 Presence of intraocular lens: Secondary | ICD-10-CM | POA: Diagnosis not present

## 2016-11-15 DIAGNOSIS — Z23 Encounter for immunization: Secondary | ICD-10-CM | POA: Diagnosis not present

## 2016-11-16 DIAGNOSIS — H401123 Primary open-angle glaucoma, left eye, severe stage: Secondary | ICD-10-CM | POA: Diagnosis not present

## 2016-11-30 ENCOUNTER — Encounter (INDEPENDENT_AMBULATORY_CARE_PROVIDER_SITE_OTHER): Payer: Medicare Other | Admitting: Ophthalmology

## 2016-11-30 DIAGNOSIS — Z961 Presence of intraocular lens: Secondary | ICD-10-CM | POA: Diagnosis not present

## 2016-11-30 DIAGNOSIS — H35033 Hypertensive retinopathy, bilateral: Secondary | ICD-10-CM | POA: Diagnosis not present

## 2016-11-30 DIAGNOSIS — H34832 Tributary (branch) retinal vein occlusion, left eye, with macular edema: Secondary | ICD-10-CM | POA: Diagnosis not present

## 2016-11-30 DIAGNOSIS — H43813 Vitreous degeneration, bilateral: Secondary | ICD-10-CM | POA: Diagnosis not present

## 2016-11-30 DIAGNOSIS — I10 Essential (primary) hypertension: Secondary | ICD-10-CM | POA: Diagnosis not present

## 2016-11-30 DIAGNOSIS — H401123 Primary open-angle glaucoma, left eye, severe stage: Secondary | ICD-10-CM | POA: Diagnosis not present

## 2016-12-14 DIAGNOSIS — Z79899 Other long term (current) drug therapy: Secondary | ICD-10-CM | POA: Diagnosis not present

## 2016-12-14 DIAGNOSIS — E7800 Pure hypercholesterolemia, unspecified: Secondary | ICD-10-CM | POA: Diagnosis not present

## 2016-12-14 DIAGNOSIS — Z6829 Body mass index (BMI) 29.0-29.9, adult: Secondary | ICD-10-CM | POA: Diagnosis not present

## 2016-12-14 DIAGNOSIS — Z125 Encounter for screening for malignant neoplasm of prostate: Secondary | ICD-10-CM | POA: Diagnosis not present

## 2016-12-14 DIAGNOSIS — R5383 Other fatigue: Secondary | ICD-10-CM | POA: Diagnosis not present

## 2016-12-14 DIAGNOSIS — Z7189 Other specified counseling: Secondary | ICD-10-CM | POA: Diagnosis not present

## 2016-12-14 DIAGNOSIS — Z1389 Encounter for screening for other disorder: Secondary | ICD-10-CM | POA: Diagnosis not present

## 2016-12-14 DIAGNOSIS — Z87891 Personal history of nicotine dependence: Secondary | ICD-10-CM | POA: Diagnosis not present

## 2016-12-14 DIAGNOSIS — Z1211 Encounter for screening for malignant neoplasm of colon: Secondary | ICD-10-CM | POA: Diagnosis not present

## 2016-12-14 DIAGNOSIS — Z299 Encounter for prophylactic measures, unspecified: Secondary | ICD-10-CM | POA: Diagnosis not present

## 2016-12-14 DIAGNOSIS — Z0000 Encounter for general adult medical examination without abnormal findings: Secondary | ICD-10-CM | POA: Diagnosis not present

## 2016-12-16 ENCOUNTER — Other Ambulatory Visit: Payer: Self-pay | Admitting: Cardiology

## 2016-12-27 ENCOUNTER — Encounter (INDEPENDENT_AMBULATORY_CARE_PROVIDER_SITE_OTHER): Payer: Medicare Other | Admitting: Ophthalmology

## 2016-12-27 DIAGNOSIS — H401123 Primary open-angle glaucoma, left eye, severe stage: Secondary | ICD-10-CM | POA: Diagnosis not present

## 2016-12-27 DIAGNOSIS — H353111 Nonexudative age-related macular degeneration, right eye, early dry stage: Secondary | ICD-10-CM

## 2016-12-27 DIAGNOSIS — I10 Essential (primary) hypertension: Secondary | ICD-10-CM | POA: Diagnosis not present

## 2016-12-27 DIAGNOSIS — H43813 Vitreous degeneration, bilateral: Secondary | ICD-10-CM | POA: Diagnosis not present

## 2016-12-27 DIAGNOSIS — Z961 Presence of intraocular lens: Secondary | ICD-10-CM | POA: Diagnosis not present

## 2016-12-27 DIAGNOSIS — H35033 Hypertensive retinopathy, bilateral: Secondary | ICD-10-CM

## 2016-12-27 DIAGNOSIS — H34832 Tributary (branch) retinal vein occlusion, left eye, with macular edema: Secondary | ICD-10-CM

## 2017-01-05 DIAGNOSIS — Z299 Encounter for prophylactic measures, unspecified: Secondary | ICD-10-CM | POA: Diagnosis not present

## 2017-01-05 DIAGNOSIS — I10 Essential (primary) hypertension: Secondary | ICD-10-CM | POA: Diagnosis not present

## 2017-01-05 DIAGNOSIS — I429 Cardiomyopathy, unspecified: Secondary | ICD-10-CM | POA: Diagnosis not present

## 2017-01-05 DIAGNOSIS — E1165 Type 2 diabetes mellitus with hyperglycemia: Secondary | ICD-10-CM | POA: Diagnosis not present

## 2017-01-05 DIAGNOSIS — I4891 Unspecified atrial fibrillation: Secondary | ICD-10-CM | POA: Diagnosis not present

## 2017-01-16 ENCOUNTER — Ambulatory Visit (INDEPENDENT_AMBULATORY_CARE_PROVIDER_SITE_OTHER): Payer: Medicare Other | Admitting: *Deleted

## 2017-01-16 DIAGNOSIS — I495 Sick sinus syndrome: Secondary | ICD-10-CM | POA: Diagnosis not present

## 2017-01-17 LAB — CUP PACEART REMOTE DEVICE CHECK
Battery Remaining Longevity: 118 mo
Brady Statistic AP VS Percent: 1 %
Brady Statistic AS VP Percent: 52 %
Brady Statistic AS VS Percent: 1 %
Brady Statistic RV Percent Paced: 99 %
Date Time Interrogation Session: 20180122130531
Implantable Lead Implant Date: 20140919
Implantable Lead Location: 753859
Lead Channel Impedance Value: 380 Ohm
Lead Channel Pacing Threshold Amplitude: 0.75 V
Lead Channel Pacing Threshold Pulse Width: 0.5 ms
Lead Channel Sensing Intrinsic Amplitude: 4.3 mV
Lead Channel Sensing Intrinsic Amplitude: 6.8 mV
Lead Channel Setting Pacing Amplitude: 1 V
Lead Channel Setting Pacing Amplitude: 2 V
Lead Channel Setting Pacing Pulse Width: 0.5 ms
MDC IDC LEAD IMPLANT DT: 20140919
MDC IDC LEAD LOCATION: 753860
MDC IDC MSMT BATTERY REMAINING PERCENTAGE: 95.5 %
MDC IDC MSMT BATTERY VOLTAGE: 2.98 V
MDC IDC MSMT LEADCHNL RA PACING THRESHOLD AMPLITUDE: 1 V
MDC IDC MSMT LEADCHNL RA PACING THRESHOLD PULSEWIDTH: 0.5 ms
MDC IDC MSMT LEADCHNL RV IMPEDANCE VALUE: 440 Ohm
MDC IDC PG IMPLANT DT: 20140919
MDC IDC PG SERIAL: 7536055
MDC IDC SET LEADCHNL RV SENSING SENSITIVITY: 2 mV
MDC IDC STAT BRADY AP VP PERCENT: 48 %
MDC IDC STAT BRADY RA PERCENT PACED: 48 %

## 2017-01-17 NOTE — Progress Notes (Signed)
Remote pacemaker transmission.   

## 2017-01-18 ENCOUNTER — Encounter: Payer: Self-pay | Admitting: Cardiology

## 2017-01-20 ENCOUNTER — Ambulatory Visit (INDEPENDENT_AMBULATORY_CARE_PROVIDER_SITE_OTHER): Payer: Medicare Other | Admitting: Adult Health

## 2017-01-20 ENCOUNTER — Encounter: Payer: Self-pay | Admitting: Adult Health

## 2017-01-20 VITALS — BP 202/90 | HR 60 | Ht 73.0 in | Wt 201.0 lb

## 2017-01-20 DIAGNOSIS — I481 Persistent atrial fibrillation: Secondary | ICD-10-CM

## 2017-01-20 DIAGNOSIS — I10 Essential (primary) hypertension: Secondary | ICD-10-CM | POA: Diagnosis not present

## 2017-01-20 DIAGNOSIS — I4819 Other persistent atrial fibrillation: Secondary | ICD-10-CM

## 2017-01-20 MED ORDER — LISINOPRIL 20 MG PO TABS: 20.0000 mg | ORAL_TABLET | Freq: Two times a day (BID) | ORAL | 3 refills | Status: DC

## 2017-01-20 MED ORDER — PRAVASTATIN SODIUM 20 MG PO TABS: 20.0000 mg | ORAL_TABLET | Freq: Every evening | ORAL | 3 refills | Status: DC

## 2017-01-20 NOTE — Patient Instructions (Addendum)
Your physician recommends that you schedule a follow-up appointment in: [place] physician recommends that you schedule a follow-up appointment in: 1 week for Blood Pressure Check.    Your physician recommends that you continue on your current medications as directed. Please refer to the Current Medication list given to you today.  Lisinopril 20 mg Two Times Daily   Your physician has requested that you have an echocardiogram. Echocardiography is a painless test that uses sound waves to create images of your heart. It provides your doctor with information about the size and shape of your heart and how well your heart's chambers and valves are working. This procedure takes approximately one hour. There are no restrictions for this procedure.  If you need a refill on your cardiac medications before your next appointment, please call your pharmacy.  Thank you for choosing [facility]!

## 2017-01-20 NOTE — Progress Notes (Signed)
Name: [name]    DOB: 12-06-1933  Age: 83 y.o.  MR#: [id number]       PCP:  Glenda Chroman, MD      Insurance: Payor: MEDICARE / Plan: MEDICARE PART A AND B / Product Type: *No Product type* /   CC:   No chief complaint on file.   VS Vitals:   01/20/17 1535  BP: (!) 202/90  Pulse: 60  SpO2: 96%  Weight: 201 lb (91.2 kg)  Height: 6\' 1"  (1.854 m)    Weights Current Weight  01/20/17 201 lb (91.2 kg)  10/17/16 200 lb (90.7 kg)  05/22/16 202 lb 12.8 oz (92 kg)    Blood Pressure  BP Readings from Last 3 Encounters:  01/20/17 (!) 202/90  10/17/16 (!) 200/104  05/23/16 128/84     Admit date:  (Not on file) Last encounter with RMR:  Visit date not found   Allergy Fentanyl  Current Outpatient Prescriptions  Medication Sig Dispense Refill  . acetaminophen (TYLENOL) 500 MG tablet Take 500 mg by mouth every 6 (six) hours as needed for pain.    Marland Kitchen alendronate (FOSAMAX) 70 MG tablet Take 70 mg by mouth every 7 (seven) days. Take with a full glass of water on an empty stomach. On Mondays    . apixaban (ELIQUIS) 5 MG TABS tablet Take 1 tablet (5 mg total) by mouth 2 (two) times daily. 60 tablet 12  . Calcium Carbonate-Vitamin D (CALCIUM 600 + D PO) Take 600 mg by mouth 2 (two) times daily.    . COMBIGAN 0.2-0.5 % ophthalmic solution Place 1 drop into both eyes 2 (two) times daily.    . dorzolamide (TRUSOPT) 2 % ophthalmic solution Place 1 drop into both eyes 3 (three) times daily.    . enalapril (VASOTEC) 20 MG tablet Take 20 mg by mouth daily.    . fish oil-omega-3 fatty acids 1000 MG capsule Take 1 g by mouth 2 (two) times daily.      . isosorbide mononitrate (IMDUR) 30 MG 24 hr tablet Take 30 mg by mouth daily.    . Multiple Vitamin (MULTIVITAMIN WITH MINERALS) TABS tablet Take 1 tablet by mouth daily.    . nitroGLYCERIN (NITROSTAT) 0.4 MG SL tablet Place 1 tablet (0.4 mg total) under the tongue every 5 (five) minutes x 3 doses as needed for chest pain. 25 tablet 3  . oxybutynin  (DITROPAN) 5 MG tablet Take 5 mg by mouth daily.    . pravastatin (PRAVACHOL) 20 MG tablet TAKE 1 TABLET (20 MG TOTAL) BY MOUTH EVERY EVENING. 30 tablet 0  . tamsulosin (FLOMAX) 0.4 MG CAPS capsule Take 0.4 mg by mouth daily.     . traMADol (ULTRAM) 50 MG tablet Take 50 mg by mouth every 6 (six) hours as needed.      No current facility-administered medications for this visit.     Discontinued Meds:    Medications Discontinued During This Encounter  Medication Reason  . famotidine (PEPCID) 20 MG tablet Error    Patient Active Problem List   Diagnosis Date Noted  . Diet-controlled diabetes mellitus ([place]) 05/23/2016  . Pain in the chest   . Chest pain 05/22/2016  . Atrial fibrillation ([place]) 05/22/2016  . Hypokalemia 05/22/2016  . Pacemaker 12/02/2013  . Sinus node dysfunction ([place]) 09/14/2013  . Multiple lacunar infarcts ([place]) 09/14/2013  . CAD (coronary artery disease) 09/14/2013  . PSVT (paroxysmal supraventricular tachycardia) ([place]) 09/14/2013  . Altered mental status 09/11/2013  .  Peripheral edema 01/27/2013  . CHF 02/22/2010  . DM 02/19/2010  . HYPOPOTASSEMIA 02/19/2010  . Essential hypertension 02/19/2010  . Diastolic heart failure ([place]) 02/19/2010  . CHEST PAIN UNSPECIFIED 02/19/2010  . Obstructive sleep apnea 11/13/2007  . ALLERGIC RHINITIS 11/13/2007  . DYSPNEA 11/13/2007  . OBESITY, NOS 02/22/2007  . ANEMIA, IRON DEFICIENCY, UNSPEC. 02/22/2007  . ANXIETY 02/22/2007  . HYPERTENSION, BENIGN SYSTEMIC 02/22/2007  . HERNIA, HIATAL, NONCONGENITAL 02/22/2007    LABS    Component Value Date/Time   NA 138 05/23/2016 0746   NA 140 05/22/2016 1136   NA 139 09/13/2013 0450   K 3.6 05/23/2016 0746   K 3.4 (L) 05/22/2016 1136   K 3.2 (L) 09/13/2013 0450   CL 105 05/23/2016 0746   CL 106 05/22/2016 1136   CL 105 09/13/2013 0450   CO2 26 05/23/2016 0746   CO2 27 05/22/2016 1136   CO2 17 (L) 09/13/2013 0450   GLUCOSE 130 (H) 05/23/2016 0746   GLUCOSE 131 (H)  05/22/2016 1136   GLUCOSE 111 (H) 09/13/2013 0450   BUN 23 (H) 05/23/2016 0746   BUN 26 (H) 05/22/2016 1136   BUN 21 09/13/2013 0450   CREATININE 1.07 05/23/2016 0746   CREATININE 1.17 05/22/2016 1136   CREATININE 0.84 09/13/2013 0450   CALCIUM 8.9 05/23/2016 0746   CALCIUM 9.7 05/22/2016 1136   CALCIUM 8.2 (L) 09/13/2013 0450   GFRNONAA >60 05/23/2016 0746   GFRNONAA 57 (L) 05/22/2016 1136   GFRNONAA 82 (L) 09/13/2013 0450   GFRAA >60 05/23/2016 0746   GFRAA >60 05/22/2016 1136   GFRAA >90 09/13/2013 0450   CMP     Component Value Date/Time   NA 138 05/23/2016 0746   K 3.6 05/23/2016 0746   CL 105 05/23/2016 0746   CO2 26 05/23/2016 0746   GLUCOSE 130 (H) 05/23/2016 0746   BUN 23 (H) 05/23/2016 0746   CREATININE 1.07 05/23/2016 0746   CALCIUM 8.9 05/23/2016 0746   PROT 7.5 05/22/2016 1136   ALBUMIN 4.1 05/22/2016 1136   AST 19 05/22/2016 1136   ALT 18 05/22/2016 1136   ALKPHOS 54 05/22/2016 1136   BILITOT 0.8 05/22/2016 1136   GFRNONAA >60 05/23/2016 0746   GFRAA >60 05/23/2016 0746       Component Value Date/Time   WBC 6.9 05/22/2016 1136   WBC 11.5 (H) 09/13/2013 0450   WBC 10.7 (H) 09/11/2013 2229   HGB 14.1 05/22/2016 1136   HGB 16.3 09/13/2013 0450   HGB 17.1 (H) 09/11/2013 2229   HCT 40.3 05/22/2016 1136   HCT 45.1 09/13/2013 0450   HCT 46.9 09/11/2013 2229   MCV 91.0 05/22/2016 1136   MCV 90.4 09/13/2013 0450   MCV 87.7 09/11/2013 2229    Lipid Panel     Component Value Date/Time   CHOL 125 09/14/2013 0625   TRIG 49 09/14/2013 0625   HDL 61 09/14/2013 0625   CHOLHDL 2.0 09/14/2013 0625   VLDL 10 09/14/2013 0625   LDLCALC 54 09/14/2013 0625    ABG    Component Value Date/Time   PHART 7.569 (H) 09/11/2013 2049   PCO2ART 22.4 (L) 09/11/2013 2049   PO2ART 67.0 (L) 09/11/2013 2049   HCO3 20.6 09/11/2013 2049   TCO2 21 09/11/2013 2049   O2SAT 96.0 09/11/2013 2049     Lab Results  Component Value Date   TSH 0.964 09/10/2013   BNP (last  3 results)  Recent Labs  05/22/16 1136  BNP 167.0*  ProBNP (last 3 results) No results for input(s): PROBNP in the last 8760 hours.  Cardiac Panel (last 3 results) No results for input(s): CKTOTAL, CKMB, TROPONINI, RELINDX in the last 72 hours.  Iron/TIBC/Ferritin/ %Sat No results found for: IRON, TIBC, FERRITIN, IRONPCTSAT   EKG Orders placed or performed during the hospital encounter of 05/22/16  . EKG 12-Lead  . EKG 12-Lead  . EKG 12-Lead (at 6am)  . EKG 12-Lead (at 6am)     Prior Assessment and Plan Problem List as of 01/20/2017 Reviewed: 10/17/2016 11:14 AM by Cristopher Peru, MD     Cardiovascular and Mediastinum   HYPERTENSION, BENIGN SYSTEMIC   Last Assessment & Plan 12/02/2013 Office Visit Written 12/02/2013  2:25 PM by Evans Lance, MD    His  Blood pressure is elevated and he has peripheral edema. I have asked him to stop amlodipine and increase his dose of coreg to 25 mg twice daily. He will reduce his sodium intake.      Essential hypertension   Last Assessment & Plan 09/16/2015 Office Visit Written 09/16/2015  9:08 AM by Evans Lance, MD    His blood pressure is well controlled. Will follow.       CHF   Diastolic heart failure [facility])   Last Assessment & Plan 05/20/2016 Office Visit Written 05/20/2016  4:55 PM by Deneise Lever, MD    Most of his exertional dyspnea is likely related to his chronic congestive heart failure with peripheral edema, complicated by obesity and deconditioning. He works with the cardiologist [facility].      Sinus node dysfunction (HCC)   CAD (coronary artery disease)   Last Assessment & Plan 09/16/2015 Office Visit Written 09/16/2015  9:07 AM by Evans Lance, MD    He denies anginal symptoms. He is sedentary. Will follow. No change in meds.      PSVT (paroxysmal supraventricular tachycardia) [facility])   Last Assessment & Plan 09/16/2015 Office Visit Written 09/16/2015  9:11 AM by Evans Lance, MD    PM interogation demonstrates  both atrial tachy and one episode of atrial fib lasting 2 minutes. I discussed watchful waiting and the use of anti-coagulation. If he has more atrial fib, then we would strongly consider adding systemic anti-coagulation to his regimen.       Atrial fibrillation [facility])     Respiratory   Obstructive sleep apnea   Last Assessment & Plan 05/20/2016 Office Visit Written 05/20/2016  4:56 PM by Deneise Lever, MD    We need to requalify him and can probably get a home sleep test done. He never tolerated CPAP, partly because he had to be up and down so much at night with back pain and nocturia. He might be able to benefit from an oral appliance as discussed. Plan-schedule home sleep test      ALLERGIC RHINITIS   Last Assessment & Plan 12/06/2012 Office Visit Written 12/06/2012 10:32 AM by Deneise Lever, MD    Not a peak pollen season, but he isn't indicating discomfort now.      HERNIA, HIATAL, NONCONGENITAL     Endocrine   DM     Nervous and Auditory   Multiple lacunar infarcts ([place])     Other   HYPOPOTASSEMIA   OBESITY, NOS   Last Assessment & Plan 01/17/2014 Office Visit Written 02/15/2014  8:19 PM by Deneise Lever, MD    Weight loss encouraged, explaining that obesity aggravates sleep apnea  ANEMIA, IRON DEFICIENCY, UNSPEC.   ANXIETY   DYSPNEA   Last Assessment & Plan 12/06/2012 Office Visit Written 12/06/2012 10:38 AM by Deneise Lever, MD    Despite his ankle edema, he says breathing is comfortable. Arthritis problems are much more limiting.       CHEST PAIN UNSPECIFIED   Peripheral edema   Last Assessment & Plan 01/17/2013 Office Visit Written 01/27/2013 10:30 PM by Deneise Lever, MD    He has had peripheral edema for a long time. I don't know how much may be related to his cardiac status, peripheral venous insufficiency, or fluid overload.      Altered mental status   Pacemaker   Last Assessment & Plan 09/16/2015 Office Visit Written 09/16/2015  9:09 AM by Evans Lance, MD    His St. Jude DDD PM is working normally. Will recheck in several months.       Chest pain   Hypokalemia   Pain in the chest   Diet-controlled diabetes mellitus ([place])       Imaging: No results found.

## 2017-01-20 NOTE — Progress Notes (Signed)
[name], [place] 11-26-1933, MRN [id number]  Cardiology Office Note   Date:  01/20/2017   PCP:  Glenda Chroman, MD  Cardiologist: Branch/Taylor/  Jory Sims, NP   No chief complaint on file.   History of Present Illness: [name] is a 83 y.o. male who presents for Iongoing assessment and management of symptomatic bradycardia, status post permanent pacemaker insertion, hypertension, chronic peripheral edema. The patient aso has a history of limited ambulation secondary to chronic back pain. The patient did have a fall where he broke his wrist and hip and was in rehabilitation for 2 months prior to being seen last.lHe is also then seen by pulmonology, Dr. Baird Lyons, and is being considered for CPAP.  Other history includes coronary artery disease, hypertension, diabetes, arthritis, anemia, anxiety, and chronic diastolic heart failure.  He has not been seen due to recent hip fracture and wrist fracture on the left after a mechanical fall. The patient has been home and has been doing well but continues to have recurrent discomfort in his chest and shortness of breath. His medications were adjusted while he was in rehabilitation have, but now that he is home his blood pressure has elevated and he has some mild chest discomfort.   Past Medical History:  Diagnosis Date  . Allergic rhinitis   . Anemia   . Anxiety   . Arthritis    "all over my body" (09/10/2013)  . BPH (benign prostatic hypertrophy)   . CAD (coronary artery disease)    a. 08/2013 LM nl, LAD 60m, D1 80ost, 95p, LCX 24m, RCA 20d, EF 60%.  . Chronic lower back pain   . Depression   . Diabetes mellitus    "diet controlled" (09/10/2013)  . Diaphragmatic hernia without mention of obstruction or gangrene   . Diastolic congestive heart failure ([place])   . Elevated liver function tests   . Essential hypertension, benign   . Lacunar infarction ([place])    a. 08/2013 post-cath, MRI: sm acute lacunar infarcts in LPCA and  LSCA, ? tiny RPICA lacunar infarct.  . Obesity   . OSA on CPAP    "wears mask part of the time" (09/10/2013)  . Osteoporosis   . Sinus node dysfunction ([place])    a. 08/2013 s/p SJM DC PPM.    Past Surgical History:  Procedure Laterality Date  . CARDIAC CATHETERIZATION  1998  . CATARACT EXTRACTION W/ INTRAOCULAR LENS  IMPLANT, BILATERAL  2010  . LEFT AND RIGHT HEART CATHETERIZATION WITH CORONARY ANGIOGRAM N/A 09/11/2013   Procedure: LEFT AND RIGHT HEART CATHETERIZATION WITH CORONARY ANGIOGRAM;  Surgeon: Wellington Hampshire, MD;  Location: [facility] CATH LAB;  Service: Cardiovascular;  Laterality: N/A;  . PERMANENT PACEMAKER INSERTION N/A 09/13/2013   Procedure: PERMANENT PACEMAKER INSERTION;  Surgeon: Evans Lance, MD;  Location: [facility] CATH LAB;  Service: Cardiovascular;  Laterality: N/A;  . right hip fx  05/26/2016  . right wrist fx  05/26/2016  . STOMACH SURGERY  03/02/2012   ?volvus; "stomach came loose and twisted; went up under rib cage; had that corrected; tacked it up" (09/10/2013)     Current Outpatient Prescriptions  Medication Sig Dispense Refill  . acetaminophen (TYLENOL) 500 MG tablet Take 500 mg by mouth every 6 (six) hours as needed for pain.    Marland Kitchen alendronate (FOSAMAX) 70 MG tablet Take 70 mg by mouth every 7 (seven) days. Take with a full glass of water on an empty stomach. On Mondays    . apixaban (  ELIQUIS) 5 MG TABS tablet Take 1 tablet (5 mg total) by mouth 2 (two) times daily. 60 tablet 12  . Calcium Carbonate-Vitamin D (CALCIUM 600 + D PO) Take 600 mg by mouth 2 (two) times daily.    . COMBIGAN 0.2-0.5 % ophthalmic solution Place 1 drop into both eyes 2 (two) times daily.    . dorzolamide (TRUSOPT) 2 % ophthalmic solution Place 1 drop into both eyes 3 (three) times daily.    . enalapril (VASOTEC) 20 MG tablet Take 20 mg by mouth daily.    . fish oil-omega-3 fatty acids 1000 MG capsule Take 1 g by mouth 2 (two) times daily.      . isosorbide mononitrate (IMDUR) 30 MG 24 hr tablet  Take 30 mg by mouth daily.    . Multiple Vitamin (MULTIVITAMIN WITH MINERALS) TABS tablet Take 1 tablet by mouth daily.    . nitroGLYCERIN (NITROSTAT) 0.4 MG SL tablet Place 1 tablet (0.4 mg total) under the tongue every 5 (five) minutes x 3 doses as needed for chest pain. 25 tablet 3  . oxybutynin (DITROPAN) 5 MG tablet Take 5 mg by mouth daily.    . pravastatin (PRAVACHOL) 20 MG tablet Take 1 tablet (20 mg total) by mouth every evening. 90 tablet 3  . tamsulosin (FLOMAX) 0.4 MG CAPS capsule Take 0.4 mg by mouth daily.     . traMADol (ULTRAM) 50 MG tablet Take 50 mg by mouth every 6 (six) hours as needed.     Marland Kitchen lisinopril (PRINIVIL,ZESTRIL) 20 MG tablet Take 1 tablet (20 mg total) by mouth 2 (two) times daily. 180 tablet 3   No current facility-administered medications for this visit.     Allergies:   Fentanyl    Social History:  The patient  reports that he has never smoked. He has never used smokeless tobacco. He reports that he does not drink alcohol or use drugs.   Family History:  The patient's family history includes Hypertension in his father and mother.    ROS: All other systems are reviewed and negative. Unless otherwise mentioned in H&P    PHYSICAL EXAM: VS:  BP (!) 202/90 (BP Location: Right Arm)   Pulse 60   Ht 6\' 1"  (1.854 m)   Wt 201 lb (91.2 kg)   SpO2 96%   BMI 26.52 kg/m  , BMI Body mass index is 26.52 kg/m. GEN: Well nourished, well developed, in no acute distress  HEENT: normal  Neck: no JVD, carotid bruits, or masses Cardiac:RRR; S4  murmur, no rubs, or gallops,no edema  Respiratory:  Clear to auscultation bilaterally, normal work of breathing GI: soft, nontender, nondistended, + BS MS: no deformity or atrophy  Skin: warm and dry, no rash Neuro:  Strength and sensation are intact Psych: euthymic mood, full affect  Recent Labs: 05/22/2016: ALT 18; B Natriuretic Peptide 167.0; Hemoglobin 14.1; Magnesium 1.9; Platelets 221 05/23/2016: BUN 23; Creatinine,  Ser 1.07; Potassium 3.6; Sodium 138    Lipid Panel    Component Value Date/Time   CHOL 125 09/14/2013 0625   TRIG 49 09/14/2013 0625   HDL 61 09/14/2013 0625   CHOLHDL 2.0 09/14/2013 0625   VLDL 10 09/14/2013 0625   LDLCALC 54 09/14/2013 0625      Wt Readings from Last 3 Encounters:  01/20/17 201 lb (91.2 kg)  10/17/16 200 lb (90.7 kg)  05/22/16 202 lb 12.8 oz (92 kg)      Other studies Reviewed: Additional studies/ records that were reviewed  today include:   ASSESSMENT AND PLAN:  1. Hypertension: Significantly elevated on this visit. I rechecked it and found it to be 200/98. I will increase his lisinopril to 20 mg twice a day. The patient will have an echocardiogram to evaluate LV systolic function. I've explained to him the necessity of keeping his blood pressure much better controlled especially in the setting of atrial fibrillation on Eliquis to avoid any intracranial bleeding. The patient is to keep a record of his blood pressures at home, and have a follow-up nurse visit for blood pressure evaluation on increased dose of lisinopril.  2. Hypercholesterolemia: The patient will continue on pravastatin 20 mg daily.Prescription is provided. He is to continue fish oil as directed.  3. Atrial fibrillation: Heart rate is well controlled currently. He is not on any rate control medications. Continue ELIQUIS.  4. Pacemaker in situ: Recently checked remotely and found to be functioning correctly. Continue remote checks and annual in person follow-up with Dr. Lovena Le.   Current medicines are reviewed at length with the patient today.    Labs/ tests ordered today include:   Orders Placed This Encounter  Procedures  . ECHOCARDIOGRAM COMPLETE     Disposition:   FU with 2-3 weeks.  Signed, Jory Sims, NP  01/20/2017 5:31 PM    [facility] [address], [place], [place] [address] Phone: [phone]; Fax: [phone]

## 2017-01-23 ENCOUNTER — Encounter (INDEPENDENT_AMBULATORY_CARE_PROVIDER_SITE_OTHER): Payer: Medicare Other | Admitting: Ophthalmology

## 2017-01-23 ENCOUNTER — Telehealth: Payer: Self-pay | Admitting: Adult Health

## 2017-01-23 DIAGNOSIS — I10 Essential (primary) hypertension: Secondary | ICD-10-CM | POA: Diagnosis not present

## 2017-01-23 DIAGNOSIS — H35033 Hypertensive retinopathy, bilateral: Secondary | ICD-10-CM

## 2017-01-23 DIAGNOSIS — H43813 Vitreous degeneration, bilateral: Secondary | ICD-10-CM

## 2017-01-23 DIAGNOSIS — H34832 Tributary (branch) retinal vein occlusion, left eye, with macular edema: Secondary | ICD-10-CM | POA: Diagnosis not present

## 2017-01-23 MED ORDER — ENALAPRIL MALEATE 20 MG PO TABS: 20.0000 mg | ORAL_TABLET | Freq: Two times a day (BID) | ORAL | 1 refills | Status: DC

## 2017-01-23 NOTE — Telephone Encounter (Signed)
Per Arnold Long NP, d/c lisinopril and take Enalapril 20 mg twice a day, wife verbalized understanding

## 2017-01-23 NOTE — Telephone Encounter (Signed)
Patient has questions regarding medications from [facility]. / tg

## 2017-01-27 ENCOUNTER — Ambulatory Visit (INDEPENDENT_AMBULATORY_CARE_PROVIDER_SITE_OTHER): Payer: Medicare Other

## 2017-01-27 VITALS — BP 140/78 | HR 79

## 2017-01-27 DIAGNOSIS — I10 Essential (primary) hypertension: Secondary | ICD-10-CM | POA: Diagnosis not present

## 2017-01-27 NOTE — Patient Instructions (Signed)
Your physician recommends that you continue on your current medications as directed. Please refer to the Current Medication list given to you today.  Thanks for choosing [facility]!!!   

## 2017-01-27 NOTE — Progress Notes (Signed)
Pt has no complaints today. States he feels fine minus his pain from arthritis.

## 2017-01-30 ENCOUNTER — Ambulatory Visit (HOSPITAL_COMMUNITY)
Admission: RE | Admit: 2017-01-30 | Discharge: 2017-01-30 | Disposition: A | Discharge status: Home / Self Care | Payer: Medicare Other | Source: Ambulatory Visit | Attending: Adult Health | Admitting: Adult Health

## 2017-01-30 DIAGNOSIS — I10 Essential (primary) hypertension: Secondary | ICD-10-CM | POA: Diagnosis not present

## 2017-01-30 LAB — ECHOCARDIOGRAM COMPLETE
EERAT: 14.22
EWDT: 458 ms
FS: 32 % (ref 28–44)
IVS/LV PW RATIO, ED: 0.95
LA ID, A-P, ES: 46 mm
LA diam end sys: 46 mm
LA diam index: 2.11 cm/m2
LA vol index: 40.6 mL/m2
LAVOL: 88.5 mL
LAVOLA4C: 86.7 mL
LDCA: 3.14 cm2
LV E/e'average: 14.22
LV SIMPSON'S DISK: 65
LV TDI E'LATERAL: 5.33
LV dias vol: 70 mL (ref 62–150)
LV e' LATERAL: 5.33 cm/s
LVDIAVOLIN: 32 mL/m2
LVEEMED: 14.22
LVOT SV: 75 mL
LVOT VTI: 23.9 cm
LVOT diameter: 20 mm
LVOT peak grad rest: 5 mmHg
LVOTPV: 113 cm/s
LVSYSVOL: 25 mL (ref 21–61)
LVSYSVOLIN: 11 mL/m2
MV Dec: 458
MV Peak grad: 2 mmHg
MV pk E vel: 75.8 m/s
MVPKAVEL: 108 m/s
PW: 14.2 mm — AB (ref 0.6–1.1)
RV LATERAL S' VELOCITY: 13.1 cm/s
RV TAPSE: 17.5 mm
Stroke v: 45 ml
TDI e' medial: 4.57

## 2017-01-30 NOTE — Progress Notes (Signed)
*  PRELIMINARY RESULTS* Echocardiogram 2D Echocardiogram has been performed.  [name] 01/30/2017, 2:28 PM

## 2017-02-01 DIAGNOSIS — G4733 Obstructive sleep apnea (adult) (pediatric): Secondary | ICD-10-CM | POA: Diagnosis not present

## 2017-02-03 DIAGNOSIS — G4733 Obstructive sleep apnea (adult) (pediatric): Secondary | ICD-10-CM | POA: Diagnosis not present

## 2017-02-08 ENCOUNTER — Other Ambulatory Visit: Payer: Self-pay | Admitting: *Deleted

## 2017-02-08 DIAGNOSIS — G4733 Obstructive sleep apnea (adult) (pediatric): Secondary | ICD-10-CM

## 2017-02-14 ENCOUNTER — Encounter (INDEPENDENT_AMBULATORY_CARE_PROVIDER_SITE_OTHER): Payer: Medicare Other | Admitting: Ophthalmology

## 2017-02-14 DIAGNOSIS — H43813 Vitreous degeneration, bilateral: Secondary | ICD-10-CM

## 2017-02-14 DIAGNOSIS — I10 Essential (primary) hypertension: Secondary | ICD-10-CM

## 2017-02-14 DIAGNOSIS — H35033 Hypertensive retinopathy, bilateral: Secondary | ICD-10-CM

## 2017-02-14 DIAGNOSIS — H34832 Tributary (branch) retinal vein occlusion, left eye, with macular edema: Secondary | ICD-10-CM

## 2017-02-15 ENCOUNTER — Ambulatory Visit (INDEPENDENT_AMBULATORY_CARE_PROVIDER_SITE_OTHER): Payer: Medicare Other | Admitting: Internal Medicine

## 2017-02-15 ENCOUNTER — Encounter: Payer: Self-pay | Admitting: Internal Medicine

## 2017-02-15 VITALS — BP 210/100 | HR 60 | Ht 72.0 in | Wt 204.4 lb

## 2017-02-15 DIAGNOSIS — I482 Chronic atrial fibrillation, unspecified: Secondary | ICD-10-CM

## 2017-02-15 DIAGNOSIS — I10 Essential (primary) hypertension: Secondary | ICD-10-CM

## 2017-02-15 DIAGNOSIS — G4733 Obstructive sleep apnea (adult) (pediatric): Secondary | ICD-10-CM | POA: Diagnosis not present

## 2017-02-15 DIAGNOSIS — I5032 Chronic diastolic (congestive) heart failure: Secondary | ICD-10-CM | POA: Diagnosis not present

## 2017-02-15 NOTE — Patient Instructions (Signed)
Order- referral to Orthodontist, Dr Ron Parker     Consider oral appliance for OSA  You need to be comfortable so you can sleep well. We can consider getting you refitted for a different CPAP mask, but we agreed to let you learn about mouth pieces for sleep apnea first.    Please be sure to check with your other doctors about your blood pressure, which was 210/ 100 today

## 2017-02-15 NOTE — Assessment & Plan Note (Signed)
Note chronic pedal edema, followed by cardiology.

## 2017-02-15 NOTE — Assessment & Plan Note (Signed)
I expressed concern and we discussed his high blood pressure. He was in this range are last cardiology evaluation and he returns to cardiology early next week.

## 2017-02-15 NOTE — Progress Notes (Signed)
HPI male never smoker followed for Allergic rhinitis, history OSA/quit CPAP, dyspnea, complicated by DM, iron deficiency anemia, HBP, CHF/pacemaker, degenerative disc disease ONOX 12/26/2012- did not qualify for O2 with sleep.  Office Spirometry 05/20/2016-moderate restriction of exhaled volume-FVC 2.63/57%, FEV1 2.11/62%, FEV1/FVC 0.8 Walk test on room air 05/20/2016-oxygen saturation 98%, 96%, 91%, 95% after 185 feet 3. No significant oxygen desaturation with this exercise. Unattended Home Sleep Test 02/01/2017- AHI 15.4/hour, desaturation to 86%, body weight 205 pounds ----------------------------------------------------------------------------------------  05/20/2016-83 year old male never smoker followed for Allergic rhinitis, history OSA/quit CPAP, dyspnea, complicated by DM, iron deficiency anemia, HBP, CHF/pacemaker, degenerative disc disease FOLLOWS FOR: DME: [facility]. Pt states he has not been wearing CPAP at all due to mask issues. Has tried several masks and unable to tolerate.  Wife wanted him assessed because of dyspnea on exertion. No acute event and no obvious infection. On Eliquis for atrial fibrillation, with pacemaker Feet swell. Little cough or wheeze. Office Spirometry 05/20/2016-moderate restriction of exhaled volume-FVC 2.63/57%, FEV1 2.11/62%, FEV1/FVC 0.8 He could never tolerate CPAP but is willing to consider evaluation for an oral appliance.  02/15/2017-83 year old male never smoker followed for OSA, allergic rhinitis, dyspnea, Treated by DM, iron deficiency anemia, HBP, CHF/pacemaker, glaucoma Unattended Home Sleep Test 02/01/2017- AHI 15.4/hour, desaturation to 86%, body weight 205 pounds Follows For: OSA, not using CPAP at this time, hard for him to sleep on his back, home sleep test done, PCP follows for HTN, took some of his bp meds this morning Arrival BP today 210/100 he describes labile hypertension which can drop quickly. Cardiology appointment pending in a few  days. He denies headache today. We reviewed his sleep study. He was not comfortable previously with CPAP-mask never fit well. He would be interested in learning about oral appliances.  ROS-see HPI Constitutional:   No-  acute weight loss, night sweats, fevers, chills,  +fatigue, lassitude. HEENT:   No-  headaches, difficulty swallowing, tooth/dental problems, sore throat,       No-  sneezing, itching, ear ache, nasal congestion, post nasal drip,  CV:  No-   chest pain, orthopnea, PND, +swelling in lower extremities, No-anasarca,  dizziness, palpitations Resp:  No change in mild chronic shortness of breath with exertion or at rest.              No-   productive cough,  No non-productive cough,  No- coughing up of blood.              No-   change in color of mucus.  No- wheezing.   Skin: No-   rash or lesions. GI:  No-   heartburn, indigestion, abdominal pain, nausea, vomiting,  GU: +nocturia MS:  +   joint pain or swelling.  + decreased range of motion.  + back pain. Neuro-     nothing unusual Psych:  No- change in mood or affect. No depression or anxiety.  No memory loss.  OBJ General- Alert, Oriented, Affect-appropriate, Distress- none acute, rolling walker, overweight Skin- rash-none, lesions- none, excoriation- none Lymphadenopathy- none Head- atraumatic            Eyes- Gross vision intact, PERRLA, conjunctivae clear secretions            Ears- Hearing aid            Nose- Clear, no-Septal dev, mucus, polyps, erosion, perforation             Throat- Mallampati III-IV , mucosa clear , drainage- none, tonsils- atrophic. +Own teeth  Neck- flexible , trachea midline, no stridor , thyroid nl, carotid no bruit Chest - symmetrical excursion , unlabored           Heart/CV- RRR , no murmur , no gallop  , no rub, nl s1 s2                           - JVD- none , edema- 2-3+, stasis changes- none, varices- none           Lung- clear to P&A, wheeze- none, cough- none , dullness-none, rub- none.  No rales heard           Chest wall- +R pacemaker Abd-  Br/ Gen/ Rectal- Not done, not indicated Extrem- cyanosis- none, clubbing, none, atrophy- none, strength- nl Neuro- grossly intact to observation

## 2017-02-15 NOTE — Assessment & Plan Note (Signed)
He is not using CPAP now, saying he couldn't get comfortable mask fit. Also CPAP restricts his sleep position some. He can't sleep on back because of back pain and rotates from side to side. He has his own teeth so we're going to let him learn about option of oral appliance with referral. I think we could get him a CPAP mask fitting session at the sleep disorder center if he doesn't want an oral appliance.

## 2017-02-20 ENCOUNTER — Other Ambulatory Visit (HOSPITAL_COMMUNITY)
Admission: RE | Admit: 2017-02-20 | Discharge: 2017-02-20 | Disposition: A | Discharge status: Home / Self Care | Payer: Medicare Other | Source: Ambulatory Visit | Attending: Adult Health | Admitting: Adult Health

## 2017-02-20 ENCOUNTER — Ambulatory Visit (INDEPENDENT_AMBULATORY_CARE_PROVIDER_SITE_OTHER): Payer: Medicare Other | Admitting: Adult Health

## 2017-02-20 ENCOUNTER — Encounter: Payer: Self-pay | Admitting: Adult Health

## 2017-02-20 VITALS — BP 152/86 | HR 60 | Ht 72.0 in | Wt 202.0 lb

## 2017-02-20 DIAGNOSIS — E7800 Pure hypercholesterolemia, unspecified: Secondary | ICD-10-CM

## 2017-02-20 DIAGNOSIS — I482 Chronic atrial fibrillation, unspecified: Secondary | ICD-10-CM

## 2017-02-20 DIAGNOSIS — I10 Essential (primary) hypertension: Secondary | ICD-10-CM

## 2017-02-20 LAB — BASIC METABOLIC PANEL
ANION GAP: 8 (ref 5–15)
BUN: 19 mg/dL (ref 6–20)
CHLORIDE: 106 mmol/L (ref 101–111)
CO2: 26 mmol/L (ref 22–32)
Calcium: 9.5 mg/dL (ref 8.9–10.3)
Creatinine, Ser: 1.07 mg/dL (ref 0.61–1.24)
GFR calc non Af Amer: >60 mL/min (ref >60)
Glucose, Bld: 149 mg/dL — ABNORMAL HIGH (ref 65–99)
POTASSIUM: 3.7 mmol/L (ref 3.5–5.1)
SODIUM: 140 mmol/L (ref 135–145)

## 2017-02-20 NOTE — Progress Notes (Signed)
Name: [name]    DOB: 1932/12/31  Age: 83 y.o.  MR#: [id number]       PCP:  Glenda Chroman, MD      Insurance: Payor: MEDICARE / Plan: MEDICARE PART A AND B / Product Type: *No Product type* /   CC:   No chief complaint on file.   VS Vitals:   02/20/17 1322  BP: (!) 152/86  Pulse: 60  SpO2: 97%  Weight: 202 lb (91.6 kg)  Height: 6' (1.829 m)    Weights Current Weight  02/20/17 202 lb (91.6 kg)  02/15/17 204 lb 6.4 oz (92.7 kg)  01/20/17 201 lb (91.2 kg)    Blood Pressure  BP Readings from Last 3 Encounters:  02/20/17 (!) 152/86  02/15/17 (!) 210/100  01/27/17 140/78     Admit date:  (Not on file) Last encounter with RMR:  01/23/2017   Allergy Fentanyl  Current Outpatient Prescriptions  Medication Sig Dispense Refill  . acetaminophen (TYLENOL) 500 MG tablet Take 500 mg by mouth every 6 (six) hours as needed for pain.    Marland Kitchen alendronate (FOSAMAX) 70 MG tablet Take 70 mg by mouth every 7 (seven) days. Take with a full glass of water on an empty stomach. On Mondays    . apixaban (ELIQUIS) 5 MG TABS tablet Take 1 tablet (5 mg total) by mouth 2 (two) times daily. 60 tablet 12  . Calcium Carbonate-Vitamin D (CALCIUM 600 + D PO) Take 600 mg by mouth 2 (two) times daily.    . COMBIGAN 0.2-0.5 % ophthalmic solution Place 1 drop into both eyes 2 (two) times daily.    . dorzolamide (TRUSOPT) 2 % ophthalmic solution Place 1 drop into both eyes 3 (three) times daily.    . enalapril (VASOTEC) 20 MG tablet Take 1 tablet (20 mg total) by mouth 2 (two) times daily. 90 tablet 1  . fish oil-omega-3 fatty acids 1000 MG capsule Take 1 g by mouth 2 (two) times daily.      . isosorbide mononitrate (IMDUR) 30 MG 24 hr tablet Take 30 mg by mouth daily.    . Multiple Vitamin (MULTIVITAMIN WITH MINERALS) TABS tablet Take 1 tablet by mouth daily.    . nitroGLYCERIN (NITROSTAT) 0.4 MG SL tablet Place 1 tablet (0.4 mg total) under the tongue every 5 (five) minutes x 3 doses as needed for chest pain.  25 tablet 3  . oxybutynin (DITROPAN) 5 MG tablet Take 5 mg by mouth daily.    . pravastatin (PRAVACHOL) 20 MG tablet Take 1 tablet (20 mg total) by mouth every evening. 90 tablet 3  . tamsulosin (FLOMAX) 0.4 MG CAPS capsule Take 0.4 mg by mouth daily.     . traMADol (ULTRAM) 50 MG tablet Take 50 mg by mouth every 6 (six) hours as needed.      No current facility-administered medications for this visit.     Discontinued Meds:   There are no discontinued medications.  Patient Active Problem List   Diagnosis Date Noted  . Diet-controlled diabetes mellitus ([place]) 05/23/2016  . Pain in the chest   . Chest pain 05/22/2016  . Atrial fibrillation ([place]) 05/22/2016  . Hypokalemia 05/22/2016  . Pacemaker 12/02/2013  . Sinus node dysfunction ([place]) 09/14/2013  . Multiple lacunar infarcts ([place]) 09/14/2013  . CAD (coronary artery disease) 09/14/2013  . PSVT (paroxysmal supraventricular tachycardia) ([place]) 09/14/2013  . Altered mental status 09/11/2013  . Peripheral edema 01/27/2013  . CHF 02/22/2010  . DM  02/19/2010  . HYPOPOTASSEMIA 02/19/2010  . Essential hypertension 02/19/2010  . Diastolic heart failure ([place]) 02/19/2010  . CHEST PAIN UNSPECIFIED 02/19/2010  . Obstructive sleep apnea 11/13/2007  . ALLERGIC RHINITIS 11/13/2007  . DYSPNEA 11/13/2007  . OBESITY, NOS 02/22/2007  . ANEMIA, IRON DEFICIENCY, UNSPEC. 02/22/2007  . ANXIETY 02/22/2007  . HERNIA, HIATAL, NONCONGENITAL 02/22/2007    LABS    Component Value Date/Time   NA 138 05/23/2016 0746   NA 140 05/22/2016 1136   NA 139 09/13/2013 0450   K 3.6 05/23/2016 0746   K 3.4 (L) 05/22/2016 1136   K 3.2 (L) 09/13/2013 0450   CL 105 05/23/2016 0746   CL 106 05/22/2016 1136   CL 105 09/13/2013 0450   CO2 26 05/23/2016 0746   CO2 27 05/22/2016 1136   CO2 17 (L) 09/13/2013 0450   GLUCOSE 130 (H) 05/23/2016 0746   GLUCOSE 131 (H) 05/22/2016 1136   GLUCOSE 111 (H) 09/13/2013 0450   BUN 23 (H) 05/23/2016 0746   BUN 26 (H)  05/22/2016 1136   BUN 21 09/13/2013 0450   CREATININE 1.07 05/23/2016 0746   CREATININE 1.17 05/22/2016 1136   CREATININE 0.84 09/13/2013 0450   CALCIUM 8.9 05/23/2016 0746   CALCIUM 9.7 05/22/2016 1136   CALCIUM 8.2 (L) 09/13/2013 0450   GFRNONAA >60 05/23/2016 0746   GFRNONAA 57 (L) 05/22/2016 1136   GFRNONAA 82 (L) 09/13/2013 0450   GFRAA >60 05/23/2016 0746   GFRAA >60 05/22/2016 1136   GFRAA >90 09/13/2013 0450   CMP     Component Value Date/Time   NA 138 05/23/2016 0746   K 3.6 05/23/2016 0746   CL 105 05/23/2016 0746   CO2 26 05/23/2016 0746   GLUCOSE 130 (H) 05/23/2016 0746   BUN 23 (H) 05/23/2016 0746   CREATININE 1.07 05/23/2016 0746   CALCIUM 8.9 05/23/2016 0746   PROT 7.5 05/22/2016 1136   ALBUMIN 4.1 05/22/2016 1136   AST 19 05/22/2016 1136   ALT 18 05/22/2016 1136   ALKPHOS 54 05/22/2016 1136   BILITOT 0.8 05/22/2016 1136   GFRNONAA >60 05/23/2016 0746   GFRAA >60 05/23/2016 0746       Component Value Date/Time   WBC 6.9 05/22/2016 1136   WBC 11.5 (H) 09/13/2013 0450   WBC 10.7 (H) 09/11/2013 2229   HGB 14.1 05/22/2016 1136   HGB 16.3 09/13/2013 0450   HGB 17.1 (H) 09/11/2013 2229   HCT 40.3 05/22/2016 1136   HCT 45.1 09/13/2013 0450   HCT 46.9 09/11/2013 2229   MCV 91.0 05/22/2016 1136   MCV 90.4 09/13/2013 0450   MCV 87.7 09/11/2013 2229    Lipid Panel     Component Value Date/Time   CHOL 125 09/14/2013 0625   TRIG 49 09/14/2013 0625   HDL 61 09/14/2013 0625   CHOLHDL 2.0 09/14/2013 0625   VLDL 10 09/14/2013 0625   LDLCALC 54 09/14/2013 0625    ABG    Component Value Date/Time   PHART 7.569 (H) 09/11/2013 2049   PCO2ART 22.4 (L) 09/11/2013 2049   PO2ART 67.0 (L) 09/11/2013 2049   HCO3 20.6 09/11/2013 2049   TCO2 21 09/11/2013 2049   O2SAT 96.0 09/11/2013 2049     Lab Results  Component Value Date   TSH 0.964 09/10/2013   BNP (last 3 results)  Recent Labs  05/22/16 1136  BNP 167.0*    ProBNP (last 3 results) No  results for input(s): PROBNP in the last 8760 hours.  Cardiac Panel (last 3 results) No results for input(s): CKTOTAL, CKMB, TROPONINI, RELINDX in the last 72 hours.  Iron/TIBC/Ferritin/ %Sat No results found for: IRON, TIBC, FERRITIN, IRONPCTSAT   EKG Orders placed or performed during the hospital encounter of 09/12/13  . EKG 12-Lead  . EKG 12-Lead  . EKG 12-Lead  . EKG 12-Lead     Prior Assessment and Plan Problem List as of 02/20/2017 Reviewed: 02/15/2017  4:26 PM by Deneise Lever, MD     Cardiovascular and Mediastinum   Essential hypertension   Last Assessment & Plan 02/15/2017 Office Visit Written 02/15/2017  4:23 PM by Deneise Lever, MD    I expressed concern and we discussed his high blood pressure. He was in this range are last cardiology evaluation and he returns to cardiology early next week.      CHF   Diastolic heart failure [facility])   Last Assessment & Plan 02/15/2017 Office Visit Written 02/15/2017  4:22 PM by Deneise Lever, MD    Note chronic pedal edema, followed by cardiology.      Sinus node dysfunction (HCC)   CAD (coronary artery disease)   Last Assessment & Plan 09/16/2015 Office Visit Written 09/16/2015  9:07 AM by Evans Lance, MD    He denies anginal symptoms. He is sedentary. Will follow. No change in meds.      PSVT (paroxysmal supraventricular tachycardia) [facility])   Last Assessment & Plan 09/16/2015 Office Visit Written 09/16/2015  9:11 AM by Evans Lance, MD    PM interogation demonstrates both atrial tachy and one episode of atrial fib lasting 2 minutes. I discussed watchful waiting and the use of anti-coagulation. If he has more atrial fib, then we would strongly consider adding systemic anti-coagulation to his regimen.       Atrial fibrillation [facility])     Respiratory   Obstructive sleep apnea   Last Assessment & Plan 02/15/2017 Office Visit Written 02/15/2017  4:21 PM by Deneise Lever, MD    He is not using CPAP now, saying he couldn't get  comfortable mask fit. Also CPAP restricts his sleep position some. He can't sleep on back because of back pain and rotates from side to side. He has his own teeth so we're going to let him learn about option of oral appliance with referral. I think we could get him a CPAP mask fitting session at the sleep disorder center if he doesn't want an oral appliance.      ALLERGIC RHINITIS   Last Assessment & Plan 12/06/2012 Office Visit Written 12/06/2012 10:32 AM by Deneise Lever, MD    Not a peak pollen season, but he isn't indicating discomfort now.      HERNIA, HIATAL, NONCONGENITAL     Endocrine   DM     Nervous and Auditory   Multiple lacunar infarcts ([place])     Other   HYPOPOTASSEMIA   OBESITY, NOS   Last Assessment & Plan 01/17/2014 Office Visit Written 02/15/2014  8:19 PM by Deneise Lever, MD    Weight loss encouraged, explaining that obesity aggravates sleep apnea      ANEMIA, IRON DEFICIENCY, UNSPEC.   ANXIETY   DYSPNEA   Last Assessment & Plan 12/06/2012 Office Visit Written 12/06/2012 10:38 AM by Deneise Lever, MD    Despite his ankle edema, he says breathing is comfortable. Arthritis problems are much more limiting.       CHEST PAIN UNSPECIFIED   Peripheral edema  Last Assessment & Plan 01/17/2013 Office Visit Written 01/27/2013 10:30 PM by Deneise Lever, MD    He has had peripheral edema for a long time. I don't know how much may be related to his cardiac status, peripheral venous insufficiency, or fluid overload.      Altered mental status   Pacemaker   Last Assessment & Plan 09/16/2015 Office Visit Written 09/16/2015  9:09 AM by Evans Lance, MD    His [employer] DDD PM is working normally. Will recheck in several months.       Chest pain   Hypokalemia   Pain in the chest   Diet-controlled diabetes mellitus ([place])       Imaging: No results found.

## 2017-02-20 NOTE — Progress Notes (Signed)
Cardiology Office Note   Date:  02/20/2017   ID:  VEER JUBY, DOB September 06, 1933, MRN [id number]  PCP:  Glenda Chroman, MD  Cardiologist: Cloria Spring, NP   Chief Complaint  Patient presents with  . Hypertension    History of Present Illness: [name] is a 83 y.o. male who presents for ongoing assessment and management of symptomatic bradycardia permanent pacemaker in situ, hypertension, and chronic peripheral edema. Other history includes coronary artery disease, diabetes, arthritis, anemia, anxiety, and chronic diastolic heart failure. The patient was last seen in the office on 01/20/2017 . Blood pressure was significantly elevated on last visit, lisinopril was increased to 20 mg twice a day. Echocardiogram was ordered. He was advised to take his blood pressure at home and keep a record. No other medication changes were made.  Echocardiogram: 01/30/2017. Left ventricle: Abnormal septal motion from pacing The cavity   size was mildly dilated. Wall thickness was increased in a   pattern of mild LVH. Systolic function was normal. The estimated   ejection fraction was in the range of 55% to 60%. Wall motion was   normal; there were no regional wall motion abnormalities. Doppler   parameters are consistent with abnormal left ventricular   relaxation (grade 1 diastolic dysfunction). - Left atrium: The atrium was moderately dilated.  He comes today with complaints of generalized pain related to his arthritis. He brings with him a copy of a blood pressure recording with blood pressures ranging between 150/93  To 138/76. He is not restricting his salt but does not add salt to his foods. He really refuses any additional medication or testing.  Past Medical History:  Diagnosis Date  . Allergic rhinitis   . Anemia   . Anxiety   . Arthritis    "all over my body" (09/10/2013)  . BPH (benign prostatic hypertrophy)   . CAD (coronary artery disease)    a. 08/2013 LM nl, LAD 63m,  D1 80ost, 95p, LCX 75m, RCA 20d, EF 60%.  . Chronic lower back pain   . Depression   . Diabetes mellitus    "diet controlled" (09/10/2013)  . Diaphragmatic hernia without mention of obstruction or gangrene   . Diastolic congestive heart failure ([place])   . Elevated liver function tests   . Essential hypertension, benign   . Lacunar infarction ([place])    a. 08/2013 post-cath, MRI: sm acute lacunar infarcts in LPCA and LSCA, ? tiny RPICA lacunar infarct.  . Obesity   . OSA on CPAP    "wears mask part of the time" (09/10/2013)  . Osteoporosis   . Sinus node dysfunction ([place])    a. 08/2013 s/p SJM DC PPM.    Past Surgical History:  Procedure Laterality Date  . CARDIAC CATHETERIZATION  1998  . CATARACT EXTRACTION W/ INTRAOCULAR LENS  IMPLANT, BILATERAL  2010  . LEFT AND RIGHT HEART CATHETERIZATION WITH CORONARY ANGIOGRAM N/A 09/11/2013   Procedure: LEFT AND RIGHT HEART CATHETERIZATION WITH CORONARY ANGIOGRAM;  Surgeon: Wellington Hampshire, MD;  Location: [facility] CATH [facility];  Service: Cardiovascular;  Laterality: N/A;  . PERMANENT PACEMAKER INSERTION N/A 09/13/2013   Procedure: PERMANENT PACEMAKER INSERTION;  Surgeon: Evans Lance, MD;  Location: [facility] CATH LAB;  Service: Cardiovascular;  Laterality: N/A;  . right hip fx  05/26/2016  . right wrist fx  05/26/2016  . STOMACH SURGERY  03/02/2012   ?volvus; "stomach came loose and twisted; went up under rib cage; had that corrected; tacked it  up" (09/10/2013)     Current Outpatient Prescriptions  Medication Sig Dispense Refill  . acetaminophen (TYLENOL) 500 MG tablet Take 500 mg by mouth every 6 (six) hours as needed for pain.    Marland Kitchen alendronate (FOSAMAX) 70 MG tablet Take 70 mg by mouth every 7 (seven) days. Take with a full glass of water on an empty stomach. On Mondays    . apixaban (ELIQUIS) 5 MG TABS tablet Take 1 tablet (5 mg total) by mouth 2 (two) times daily. 60 tablet 12  . Calcium Carbonate-Vitamin D (CALCIUM 600 + D PO) Take 600 mg by mouth 2 (two)  times daily.    . COMBIGAN 0.2-0.5 % ophthalmic solution Place 1 drop into both eyes 2 (two) times daily.    . dorzolamide (TRUSOPT) 2 % ophthalmic solution Place 1 drop into both eyes 3 (three) times daily.    . enalapril (VASOTEC) 20 MG tablet Take 1 tablet (20 mg total) by mouth 2 (two) times daily. 90 tablet 1  . fish oil-omega-3 fatty acids 1000 MG capsule Take 1 g by mouth 2 (two) times daily.      . isosorbide mononitrate (IMDUR) 30 MG 24 hr tablet Take 30 mg by mouth daily.    . Multiple Vitamin (MULTIVITAMIN WITH MINERALS) TABS tablet Take 1 tablet by mouth daily.    . nitroGLYCERIN (NITROSTAT) 0.4 MG SL tablet Place 1 tablet (0.4 mg total) under the tongue every 5 (five) minutes x 3 doses as needed for chest pain. 25 tablet 3  . oxybutynin (DITROPAN) 5 MG tablet Take 5 mg by mouth daily.    . pravastatin (PRAVACHOL) 20 MG tablet Take 1 tablet (20 mg total) by mouth every evening. 90 tablet 3  . tamsulosin (FLOMAX) 0.4 MG CAPS capsule Take 0.4 mg by mouth daily.     . traMADol (ULTRAM) 50 MG tablet Take 50 mg by mouth every 6 (six) hours as needed.      No current facility-administered medications for this visit.     Allergies:   Fentanyl    Social History:  The patient  reports that he has never smoked. He has never used smokeless tobacco. He reports that he does not drink alcohol or use drugs.   Family History:  The patient's family history includes Hypertension in his father and mother.    ROS: All other systems are reviewed and negative. Unless otherwise mentioned in H&P    PHYSICAL EXAM: VS:  BP (!) 152/86   Pulse 60   Ht 6' (1.829 m)   Wt 202 lb (91.6 kg)   SpO2 97%   BMI 27.40 kg/m  , BMI Body mass index is 27.4 kg/m. GEN: Well nourished, well developed, in no acute distress  HEENT: normal  Neck: no JVD, carotid bruits, or masses Cardiac: RRR; 1/6 systolic  murmur rubs, or gallops,no edema  Respiratory:  clear to auscultation bilaterally, normal work of  breathing GI: soft, nontender, nondistended, + BS MS: Arthritic deformity noted in his hands. Skin: warm and dry, no rash Neuro:  Strength and sensation are intact Psych: euthymic mood, full affect    Recent Labs: 05/22/2016: ALT 18; B Natriuretic Peptide 167.0; Hemoglobin 14.1; Magnesium 1.9; Platelets 221 05/23/2016: BUN 23; Creatinine, Ser 1.07; Potassium 3.6; Sodium 138    Lipid Panel    Component Value Date/Time   CHOL 125 09/14/2013 0625   TRIG 49 09/14/2013 0625   HDL 61 09/14/2013 0625   CHOLHDL 2.0 09/14/2013 0625   VLDL  10 09/14/2013 0625   LDLCALC 54 09/14/2013 0625      Wt Readings from Last 3 Encounters:  02/20/17 202 lb (91.6 kg)  02/15/17 204 lb 6.4 oz (92.7 kg)  01/20/17 201 lb (91.2 kg)     ASSESSMENT AND PLAN:  1.  Hypertension: Improved with increased dose of lisinopril to 20 mg twice a day, but not always optimal. May need to adjust up further, but patient refuses to accept any further changes. He states he feels fatigued enough as it is on all the medications that he is taking. I will check a BMET to evaluate for kidney function.  2. Hypercholesterolemia: Patient continues on pravastatin. He has follow-up labs by his primary care physician in April with ongoing annual labs.  3. Atrial fibrillation: Heart rate is regular and well controlled currently. He will continue ELIQUIS. BMET is ordered.   Current medicines are reviewed at length with the patient today.    Labs/ tests ordered today include:  No orders of the defined types were placed in this encounter.    Disposition:   FU with 6 months,  Signed, Jory Sims, NP  02/20/2017 1:47 PM    [facility] [address], [place], [place] [address] Phone: [phone]; Fax: [phone]

## 2017-02-20 NOTE — Patient Instructions (Signed)
Your physician wants you to follow-up in: 6 Months with Dr. Harl Bowie. You will receive a reminder letter in the mail two months in advance. If you don't receive a letter, please call our office to schedule the follow-up appointment.  Your physician recommends that you continue on your current medications as directed. Please refer to the Current Medication list given to you today.  Your physician recommends that you have lab work done today.  If you need a refill on your cardiac medications before your next appointment, please call your pharmacy.  Thank you for choosing [facility]!

## 2017-03-14 ENCOUNTER — Encounter (INDEPENDENT_AMBULATORY_CARE_PROVIDER_SITE_OTHER): Payer: Medicare Other | Admitting: Ophthalmology

## 2017-03-14 DIAGNOSIS — H35033 Hypertensive retinopathy, bilateral: Secondary | ICD-10-CM | POA: Diagnosis not present

## 2017-03-14 DIAGNOSIS — I10 Essential (primary) hypertension: Secondary | ICD-10-CM | POA: Diagnosis not present

## 2017-03-14 DIAGNOSIS — H34832 Tributary (branch) retinal vein occlusion, left eye, with macular edema: Secondary | ICD-10-CM | POA: Diagnosis not present

## 2017-03-14 DIAGNOSIS — H43813 Vitreous degeneration, bilateral: Secondary | ICD-10-CM

## 2017-03-23 DIAGNOSIS — M47816 Spondylosis without myelopathy or radiculopathy, lumbar region: Secondary | ICD-10-CM | POA: Diagnosis not present

## 2017-04-17 ENCOUNTER — Ambulatory Visit (INDEPENDENT_AMBULATORY_CARE_PROVIDER_SITE_OTHER): Payer: Medicare Other | Admitting: *Deleted

## 2017-04-17 DIAGNOSIS — I495 Sick sinus syndrome: Secondary | ICD-10-CM

## 2017-04-18 ENCOUNTER — Encounter (INDEPENDENT_AMBULATORY_CARE_PROVIDER_SITE_OTHER): Payer: Medicare Other | Admitting: Ophthalmology

## 2017-04-18 NOTE — Progress Notes (Signed)
Remote pacemaker transmission.   

## 2017-04-20 ENCOUNTER — Encounter: Payer: Self-pay | Admitting: Cardiology

## 2017-04-20 LAB — CUP PACEART REMOTE DEVICE CHECK
Battery Remaining Longevity: 119 mo
Battery Remaining Percentage: 95.5 %
Brady Statistic AP VP Percent: 54 %
Brady Statistic AP VS Percent: 1 %
Brady Statistic AS VP Percent: 46 %
Brady Statistic AS VS Percent: 1 %
Implantable Lead Implant Date: 20140919
Implantable Lead Location: 753859
Lead Channel Impedance Value: 380 Ohm
Lead Channel Impedance Value: 440 Ohm
Lead Channel Pacing Threshold Amplitude: 0.625 V
Lead Channel Pacing Threshold Pulse Width: 0.5 ms
Lead Channel Sensing Intrinsic Amplitude: 3.8 mV
Lead Channel Sensing Intrinsic Amplitude: 6.5 mV
Lead Channel Setting Pacing Amplitude: 0.875
Lead Channel Setting Pacing Amplitude: 2 V
Lead Channel Setting Pacing Pulse Width: 0.5 ms
MDC IDC LEAD IMPLANT DT: 20140919
MDC IDC LEAD LOCATION: 753860
MDC IDC MSMT BATTERY VOLTAGE: 2.98 V
MDC IDC MSMT LEADCHNL RA PACING THRESHOLD AMPLITUDE: 1 V
MDC IDC MSMT LEADCHNL RA PACING THRESHOLD PULSEWIDTH: 0.5 ms
MDC IDC PG IMPLANT DT: 20140919
MDC IDC PG SERIAL: 7536055
MDC IDC SESS DTM: 20180423122242
MDC IDC SET LEADCHNL RV SENSING SENSITIVITY: 2 mV
MDC IDC STAT BRADY RA PERCENT PACED: 54 %
MDC IDC STAT BRADY RV PERCENT PACED: 99 %

## 2017-05-02 ENCOUNTER — Encounter (INDEPENDENT_AMBULATORY_CARE_PROVIDER_SITE_OTHER): Payer: Medicare Other | Admitting: Ophthalmology

## 2017-05-02 DIAGNOSIS — I10 Essential (primary) hypertension: Secondary | ICD-10-CM

## 2017-05-02 DIAGNOSIS — H43813 Vitreous degeneration, bilateral: Secondary | ICD-10-CM | POA: Diagnosis not present

## 2017-05-02 DIAGNOSIS — H35033 Hypertensive retinopathy, bilateral: Secondary | ICD-10-CM | POA: Diagnosis not present

## 2017-05-02 DIAGNOSIS — H34832 Tributary (branch) retinal vein occlusion, left eye, with macular edema: Secondary | ICD-10-CM | POA: Diagnosis not present

## 2017-05-03 DIAGNOSIS — I10 Essential (primary) hypertension: Secondary | ICD-10-CM | POA: Diagnosis not present

## 2017-05-03 DIAGNOSIS — I4891 Unspecified atrial fibrillation: Secondary | ICD-10-CM | POA: Diagnosis not present

## 2017-05-03 DIAGNOSIS — N3281 Overactive bladder: Secondary | ICD-10-CM | POA: Diagnosis not present

## 2017-05-03 DIAGNOSIS — N400 Enlarged prostate without lower urinary tract symptoms: Secondary | ICD-10-CM | POA: Diagnosis not present

## 2017-05-03 DIAGNOSIS — E1165 Type 2 diabetes mellitus with hyperglycemia: Secondary | ICD-10-CM | POA: Diagnosis not present

## 2017-05-03 DIAGNOSIS — I2510 Atherosclerotic heart disease of native coronary artery without angina pectoris: Secondary | ICD-10-CM | POA: Diagnosis not present

## 2017-05-03 DIAGNOSIS — Z299 Encounter for prophylactic measures, unspecified: Secondary | ICD-10-CM | POA: Diagnosis not present

## 2017-05-03 DIAGNOSIS — E7800 Pure hypercholesterolemia, unspecified: Secondary | ICD-10-CM | POA: Diagnosis not present

## 2017-05-03 DIAGNOSIS — Z6828 Body mass index (BMI) 28.0-28.9, adult: Secondary | ICD-10-CM | POA: Diagnosis not present

## 2017-05-03 DIAGNOSIS — K219 Gastro-esophageal reflux disease without esophagitis: Secondary | ICD-10-CM | POA: Diagnosis not present

## 2017-05-03 DIAGNOSIS — I429 Cardiomyopathy, unspecified: Secondary | ICD-10-CM | POA: Diagnosis not present

## 2017-05-08 ENCOUNTER — Other Ambulatory Visit: Payer: Self-pay | Admitting: Internal Medicine

## 2017-05-09 DIAGNOSIS — Z961 Presence of intraocular lens: Secondary | ICD-10-CM | POA: Diagnosis not present

## 2017-06-13 ENCOUNTER — Encounter (INDEPENDENT_AMBULATORY_CARE_PROVIDER_SITE_OTHER): Payer: Medicare Other | Admitting: Ophthalmology

## 2017-06-13 DIAGNOSIS — I10 Essential (primary) hypertension: Secondary | ICD-10-CM

## 2017-06-13 DIAGNOSIS — H43813 Vitreous degeneration, bilateral: Secondary | ICD-10-CM | POA: Diagnosis not present

## 2017-06-13 DIAGNOSIS — H35033 Hypertensive retinopathy, bilateral: Secondary | ICD-10-CM

## 2017-06-13 DIAGNOSIS — H34832 Tributary (branch) retinal vein occlusion, left eye, with macular edema: Secondary | ICD-10-CM | POA: Diagnosis not present

## 2017-07-04 ENCOUNTER — Telehealth: Payer: Self-pay | Admitting: Internal Medicine

## 2017-07-04 NOTE — Telephone Encounter (Signed)
New Message  Pt c/o medication issue:  1. Name of Medication: Eliquis  2. How are you currently taking this medication (dosage and times per day)? 5mg     3. Are you having a reaction (difficulty breathing--STAT)? No  4. What is your medication issue? Per pt wife would like to know if there is a generic brand for this medication. Per pt wife pt will be in the doughnut hole. Please call back to discuss

## 2017-07-04 NOTE — Telephone Encounter (Addendum)
**Note De-Identified  Obfuscation** I do not see where we have done a prior auth or pt assistance for Eliquis. The pt is also seen in our [place] office and may have tired through that office for assistance.  Will be happy to help pt with the cost Eliquis if he needs PA or Pt assistance.

## 2017-07-04 NOTE — Telephone Encounter (Signed)
New message      [name] is returning your call

## 2017-07-04 NOTE — Telephone Encounter (Signed)
Returned call to [place] (not on [place]). Left message to call back.

## 2017-07-05 NOTE — Telephone Encounter (Signed)
Called, spoke with pt. Pt stated he is in the donut hole. Pt would like samples. Pt stated he has not applied for assistance at our office or the [place] office. . Informed I will check to see if we have any samples available, as well as information about patient assistance through the manufacturer and PAN. Pt verbalized understanding and was appreciative of my call.

## 2017-07-07 NOTE — Telephone Encounter (Signed)
I s/w the pt and his wife concerning Pt assistance through [employer] for his Eliquis. They have requested that I mail them the pt assistance application through the mail.  Also the pt states that he has about a week and a half supply of Eliquis left. He is advised to call the office next week and that we will provide him samples if available.  He verbalized understanding to all of the above.

## 2017-07-17 ENCOUNTER — Ambulatory Visit (INDEPENDENT_AMBULATORY_CARE_PROVIDER_SITE_OTHER): Payer: Medicare Other | Admitting: *Deleted

## 2017-07-17 DIAGNOSIS — I495 Sick sinus syndrome: Secondary | ICD-10-CM | POA: Diagnosis not present

## 2017-07-18 DIAGNOSIS — R58 Hemorrhage, not elsewhere classified: Secondary | ICD-10-CM | POA: Diagnosis not present

## 2017-07-18 DIAGNOSIS — Z713 Dietary counseling and surveillance: Secondary | ICD-10-CM | POA: Diagnosis not present

## 2017-07-18 DIAGNOSIS — Z299 Encounter for prophylactic measures, unspecified: Secondary | ICD-10-CM | POA: Diagnosis not present

## 2017-07-18 DIAGNOSIS — M25421 Effusion, right elbow: Secondary | ICD-10-CM | POA: Diagnosis not present

## 2017-07-18 DIAGNOSIS — I429 Cardiomyopathy, unspecified: Secondary | ICD-10-CM | POA: Diagnosis not present

## 2017-07-18 DIAGNOSIS — I10 Essential (primary) hypertension: Secondary | ICD-10-CM | POA: Diagnosis not present

## 2017-07-18 DIAGNOSIS — Z6828 Body mass index (BMI) 28.0-28.9, adult: Secondary | ICD-10-CM | POA: Diagnosis not present

## 2017-07-18 DIAGNOSIS — I4891 Unspecified atrial fibrillation: Secondary | ICD-10-CM | POA: Diagnosis not present

## 2017-07-18 DIAGNOSIS — N400 Enlarged prostate without lower urinary tract symptoms: Secondary | ICD-10-CM | POA: Diagnosis not present

## 2017-07-18 DIAGNOSIS — M1990 Unspecified osteoarthritis, unspecified site: Secondary | ICD-10-CM | POA: Diagnosis not present

## 2017-07-19 NOTE — Progress Notes (Signed)
Remote pacemaker transmission.   

## 2017-07-20 ENCOUNTER — Encounter: Payer: Self-pay | Admitting: Cardiology

## 2017-07-27 ENCOUNTER — Other Ambulatory Visit: Payer: Self-pay

## 2017-07-27 MED ORDER — APIXABAN 5 MG PO TABS: 5.0000 mg | ORAL_TABLET | Freq: Two times a day (BID) | ORAL | 3 refills | Status: DC

## 2017-07-27 NOTE — Telephone Encounter (Signed)
**Note De-Identified Alba Perillo Obfuscation** I received a letter Harlei Lehrmann fax from [place] stating that they have received the pts application and that we should have a decision within 2 days.

## 2017-07-27 NOTE — Telephone Encounter (Signed)
**Note De-Identified  Obfuscation** The pt and his wife came to the office this morning to drop of the pts BMS application for Eliquis. They asked that I come up front to go over the application with them which I did.  His application was filled out correctly by the pts wife and they brought in his statement from his pharmacy showing how much he has paid out of pocket on his medication.  They do have BMS phone number to call to check the progress of his application and they are aware to call us if he gets low on his Eliquis while waiting and we will provide him samples if available.  I have faxed the pts pt assistance application to [employer] and am awaiting response.

## 2017-08-07 ENCOUNTER — Encounter (INDEPENDENT_AMBULATORY_CARE_PROVIDER_SITE_OTHER): Payer: Medicare Other | Admitting: Ophthalmology

## 2017-08-07 DIAGNOSIS — H353132 Nonexudative age-related macular degeneration, bilateral, intermediate dry stage: Secondary | ICD-10-CM

## 2017-08-07 DIAGNOSIS — I10 Essential (primary) hypertension: Secondary | ICD-10-CM

## 2017-08-07 DIAGNOSIS — H43813 Vitreous degeneration, bilateral: Secondary | ICD-10-CM

## 2017-08-07 DIAGNOSIS — H34832 Tributary (branch) retinal vein occlusion, left eye, with macular edema: Secondary | ICD-10-CM

## 2017-08-07 DIAGNOSIS — H35033 Hypertensive retinopathy, bilateral: Secondary | ICD-10-CM

## 2017-08-08 DIAGNOSIS — E1165 Type 2 diabetes mellitus with hyperglycemia: Secondary | ICD-10-CM | POA: Diagnosis not present

## 2017-08-08 DIAGNOSIS — Z6828 Body mass index (BMI) 28.0-28.9, adult: Secondary | ICD-10-CM | POA: Diagnosis not present

## 2017-08-08 DIAGNOSIS — N400 Enlarged prostate without lower urinary tract symptoms: Secondary | ICD-10-CM | POA: Diagnosis not present

## 2017-08-08 DIAGNOSIS — Z713 Dietary counseling and surveillance: Secondary | ICD-10-CM | POA: Diagnosis not present

## 2017-08-08 DIAGNOSIS — I4891 Unspecified atrial fibrillation: Secondary | ICD-10-CM | POA: Diagnosis not present

## 2017-08-08 DIAGNOSIS — Z299 Encounter for prophylactic measures, unspecified: Secondary | ICD-10-CM | POA: Diagnosis not present

## 2017-08-08 DIAGNOSIS — I2510 Atherosclerotic heart disease of native coronary artery without angina pectoris: Secondary | ICD-10-CM | POA: Diagnosis not present

## 2017-08-08 DIAGNOSIS — I10 Essential (primary) hypertension: Secondary | ICD-10-CM | POA: Diagnosis not present

## 2017-08-15 LAB — CUP PACEART REMOTE DEVICE CHECK
Battery Remaining Longevity: 118 mo
Battery Voltage: 2.98 V
Brady Statistic AS VP Percent: 42 %
Brady Statistic RA Percent Paced: 57 %
Brady Statistic RV Percent Paced: 99 %
Implantable Lead Implant Date: 20140919
Implantable Lead Location: 753859
Implantable Pulse Generator Implant Date: 20140919
Lead Channel Impedance Value: 380 Ohm
Lead Channel Impedance Value: 410 Ohm
Lead Channel Sensing Intrinsic Amplitude: 12 mV
Lead Channel Setting Pacing Amplitude: 0.875
Lead Channel Setting Pacing Amplitude: 1.875
Lead Channel Setting Pacing Pulse Width: 0.5 ms
Lead Channel Setting Sensing Sensitivity: 2 mV
MDC IDC LEAD IMPLANT DT: 20140919
MDC IDC LEAD LOCATION: 753860
MDC IDC MSMT BATTERY REMAINING PERCENTAGE: 95.5 %
MDC IDC MSMT LEADCHNL RA PACING THRESHOLD AMPLITUDE: 0.875 V
MDC IDC MSMT LEADCHNL RA PACING THRESHOLD PULSEWIDTH: 0.5 ms
MDC IDC MSMT LEADCHNL RA SENSING INTR AMPL: 3.5 mV
MDC IDC MSMT LEADCHNL RV PACING THRESHOLD AMPLITUDE: 0.625 V
MDC IDC MSMT LEADCHNL RV PACING THRESHOLD PULSEWIDTH: 0.5 ms
MDC IDC SESS DTM: 20180723123508
MDC IDC STAT BRADY AP VP PERCENT: 57 %
MDC IDC STAT BRADY AP VS PERCENT: 1 %
MDC IDC STAT BRADY AS VS PERCENT: 1 %
Pulse Gen Serial Number: 7536055

## 2017-08-16 ENCOUNTER — Encounter: Payer: Self-pay | Admitting: Internal Medicine

## 2017-08-16 ENCOUNTER — Ambulatory Visit (INDEPENDENT_AMBULATORY_CARE_PROVIDER_SITE_OTHER): Payer: Medicare Other | Admitting: Internal Medicine

## 2017-08-16 DIAGNOSIS — J309 Allergic rhinitis, unspecified: Secondary | ICD-10-CM

## 2017-08-16 DIAGNOSIS — G4733 Obstructive sleep apnea (adult) (pediatric): Secondary | ICD-10-CM

## 2017-08-16 DIAGNOSIS — I2510 Atherosclerotic heart disease of native coronary artery without angina pectoris: Secondary | ICD-10-CM | POA: Diagnosis not present

## 2017-08-16 NOTE — Patient Instructions (Signed)
Glad the oral appliance has helped the sleep apnea problem.  For the post-nasal drip try otc Flonase/ fluticasone nasal spray   1-2 puffs each nostril once daily at bedtime. After several days, hopefully this will begin to make a difference.  Please call if we can help

## 2017-08-16 NOTE — Progress Notes (Signed)
HPI male never smoker followed for Allergic rhinitis, history OSA/quit CPAP, dyspnea, complicated by DM, iron deficiency anemia, HBP, CHF/pacemaker, degenerative disc disease ONOX 12/26/2012- did not qualify for O2 with sleep.  Office Spirometry 05/20/2016-moderate restriction of exhaled volume-FVC 2.63/57%, FEV1 2.11/62%, FEV1/FVC 0.8 Walk test on room air 05/20/2016-oxygen saturation 98%, 96%, 91%, 95% after 185 feet 3. No significant oxygen desaturation with this exercise. Unattended Home Sleep Test 02/01/2017- AHI 15.4/hour, desaturation to 86%, body weight 205 pounds ---------------------------------------------------------------------------------------  02/15/2017-83 year old male never smoker followed for OSA, allergic rhinitis, dyspnea, Treated by DM, iron deficiency anemia, HBP, CHF/pacemaker, glaucoma Unattended Home Sleep Test 02/01/2017- AHI 15.4/hour, desaturation to 86%, body weight 205 pounds Follows For: OSA, not using CPAP at this time, hard for him to sleep on his back, home sleep test done, PCP follows for HTN, took some of his bp meds this morning Arrival BP today 210/100 he describes labile hypertension which can drop quickly. Cardiology appointment pending in a few days. He denies headache today. We reviewed his sleep study. He was not comfortable previously with CPAP-mask never fit well. He would be interested in learning about oral appliances.  08/16/17- 83 year old male never smoker followed for OSA, allergic rhinitis, dyspnea, Treated by DM, iron deficiency anemia, HBP, CHF/pacemaker, glaucoma Pt states that he fell June 2017 broke his hip and rt arm and was in rehab. Pt was sent to a dentist to get an implant to move his lower jaw forward. Pt does have a CPAP machine but has not worn it in at least 2 years. Pt cannot sleep with the face mask due to not being able to sleep on his back. He says he is "fairly" comfortable with his oral appliance which does help him sleep and stops  is snoring according to his wife. Complains of postnasal drip-watery. Gets flu shots at health Department. CXR 05/22/16 IMPRESSION: 1. No acute abnormality. 2. Stable mild cardiomegaly and mild changes of COPD.  ROS-see HPI  + = positive Constitutional:   No-  acute weight loss, night sweats, fevers, chills,  +fatigue, lassitude. HEENT:   No-  headaches, difficulty swallowing, tooth/dental problems, sore throat,       No-  sneezing, itching, ear ache, nasal congestion, + post nasal drip,  CV:  No-   chest pain, orthopnea, PND, +swelling in lower extremities, No-anasarca,  dizziness, palpitations Resp:  No change in mild chronic shortness of breath with exertion or at rest.              No-   productive cough,  No non-productive cough,  No- coughing up of blood.              No-   change in color of mucus.  No- wheezing.   Skin: No-   rash or lesions. GI:  No-   heartburn, indigestion, abdominal pain, nausea, vomiting,  GU: +nocturia MS:  +   joint pain or swelling.  + decreased range of motion.  + back pain. Neuro-     nothing unusual Psych:  No- change in mood or affect. No depression or anxiety.  No memory loss.  OBJ General- Alert, Oriented, Affect-appropriate, Distress- none acute, overweight Skin- rash-none, lesions- none, excoriation- none Lymphadenopathy- none Head- atraumatic            Eyes- Gross vision intact, PERRLA, conjunctivae clear secretions            Ears- Hearing aid            Nose-  Clear, no-Septal dev, mucus, polyps, erosion, perforation             Throat- Mallampati III-IV , mucosa clear , drainage- none, tonsils- atrophic. +Own teeth Neck- flexible , trachea midline, no stridor , thyroid nl, carotid no bruit Chest - symmetrical excursion , unlabored           Heart/CV- RRR , no murmur , no gallop  , no rub, nl s1 s2                           - JVD- none , edema- 2+, stasis changes- none, varices- none           Lung- clear to P&A, wheeze- none, cough- none  , dullness-none, rub- none. No rales heard           Chest wall- + pacemaker Abd-  Br/ Gen/ Rectal- Not done, not indicated Extrem- cyanosis- none, clubbing, none, atrophy- none, strength- nl, + cane Neuro- grossly intact to observation

## 2017-08-24 ENCOUNTER — Encounter: Payer: Self-pay | Admitting: Cardiology

## 2017-08-24 ENCOUNTER — Ambulatory Visit (INDEPENDENT_AMBULATORY_CARE_PROVIDER_SITE_OTHER): Payer: Medicare Other | Admitting: Cardiology

## 2017-08-24 VITALS — BP 158/86 | HR 62 | Ht 73.0 in | Wt 196.0 lb

## 2017-08-24 DIAGNOSIS — E782 Mixed hyperlipidemia: Secondary | ICD-10-CM | POA: Diagnosis not present

## 2017-08-24 DIAGNOSIS — I4891 Unspecified atrial fibrillation: Secondary | ICD-10-CM

## 2017-08-24 DIAGNOSIS — I2510 Atherosclerotic heart disease of native coronary artery without angina pectoris: Secondary | ICD-10-CM

## 2017-08-24 DIAGNOSIS — I10 Essential (primary) hypertension: Secondary | ICD-10-CM

## 2017-08-24 NOTE — Progress Notes (Signed)
Clinical Summary Mr. Boike is a 83 y.o.male  seen today for follow up of the following medical problems.   1. Mild systolic dysfunction  - echo 07/2012 w/ LVEF 40-45%  - Cath 08/2013 w/ minimal CAD, LV gram 60% with normal filling pressures on RHC.    - no recent SOB/DOE. No recent edema - compliant with meds.   2. HTN   - home bp's 140s-150/80-90s - previously low bp's during recent rehab stay, had some medications stopped at that time    3. Bradycardia  -s/p St Jude dual chamber pacemaker   - normal device function 06/2017   4. Hyperlipidemia - he is compliant with statin  5. Afib - no recent palpitations - no bleeding on eliquis.  Past Medical History:  Diagnosis Date  . Allergic rhinitis   . Anemia   . Anxiety   . Arthritis    "all over my body" (09/10/2013)  . BPH (benign prostatic hypertrophy)   . CAD (coronary artery disease)    a. 08/2013 LM nl, LAD 57m, D1 80ost, 95p, LCX 1m, RCA 20d, EF 60%.  . Chronic lower back pain   . Depression   . Diabetes mellitus    "diet controlled" (09/10/2013)  . Diaphragmatic hernia without mention of obstruction or gangrene   . Diastolic congestive heart failure ([place])   . Elevated liver function tests   . Essential hypertension, benign   . Lacunar infarction ([place])    a. 08/2013 post-cath, MRI: sm acute lacunar infarcts in LPCA and LSCA, ? tiny RPICA lacunar infarct.  . Obesity   . OSA on CPAP    "wears mask part of the time" (09/10/2013)  . Osteoporosis   . Sinus node dysfunction ([place])    a. 08/2013 s/p SJM DC PPM.     Allergies  Allergen Reactions  . Fentanyl Other (See Comments)    resp suppression per wife, out for 2 days      Current Outpatient Prescriptions  Medication Sig Dispense Refill  . acetaminophen (TYLENOL) 500 MG tablet Take 500 mg by mouth every 6 (six) hours as needed for pain.    Marland Kitchen alendronate (FOSAMAX) 70 MG tablet Take 70 mg by mouth every 7 (seven) days. Take with a full glass  of water on an empty stomach. On Mondays    . apixaban (ELIQUIS) 5 MG TABS tablet Take 1 tablet (5 mg total) by mouth 2 (two) times daily. 180 tablet 3  . Calcium Carbonate-Vitamin D (CALCIUM 600 + D PO) Take 600 mg by mouth 2 (two) times daily.    . COMBIGAN 0.2-0.5 % ophthalmic solution Place 1 drop into both eyes 2 (two) times daily.    . dorzolamide (TRUSOPT) 2 % ophthalmic solution Place 1 drop into both eyes 3 (three) times daily.    . enalapril (VASOTEC) 20 MG tablet Take 1 tablet (20 mg total) by mouth 2 (two) times daily. 90 tablet 1  . fish oil-omega-3 fatty acids 1000 MG capsule Take 1 g by mouth 2 (two) times daily.      . isosorbide mononitrate (IMDUR) 30 MG 24 hr tablet Take 30 mg by mouth daily.    . Multiple Vitamin (MULTIVITAMIN WITH MINERALS) TABS tablet Take 1 tablet by mouth daily.    . nitroGLYCERIN (NITROSTAT) 0.4 MG SL tablet Place 1 tablet (0.4 mg total) under the tongue every 5 (five) minutes x 3 doses as needed for chest pain. 25 tablet 3  . oxybutynin (DITROPAN) 5  MG tablet Take 5 mg by mouth daily.    . pravastatin (PRAVACHOL) 20 MG tablet Take 1 tablet (20 mg total) by mouth every evening. 90 tablet 3  . tamsulosin (FLOMAX) 0.4 MG CAPS capsule Take 0.4 mg by mouth daily.     . traMADol (ULTRAM) 50 MG tablet Take 50 mg by mouth every 6 (six) hours as needed.      No current facility-administered medications for this visit.      Past Surgical History:  Procedure Laterality Date  . CARDIAC CATHETERIZATION  1998  . CATARACT EXTRACTION W/ INTRAOCULAR LENS  IMPLANT, BILATERAL  2010  . LEFT AND RIGHT HEART CATHETERIZATION WITH CORONARY ANGIOGRAM N/A 09/11/2013   Procedure: LEFT AND RIGHT HEART CATHETERIZATION WITH CORONARY ANGIOGRAM;  Surgeon: Wellington Hampshire, MD;  Location: [facility] CATH LAB;  Service: Cardiovascular;  Laterality: N/A;  . PERMANENT PACEMAKER INSERTION N/A 09/13/2013   Procedure: PERMANENT PACEMAKER INSERTION;  Surgeon: Evans Lance, MD;  Location: [facility] CATH  LAB;  Service: Cardiovascular;  Laterality: N/A;  . right hip fx  05/26/2016  . right wrist fx  05/26/2016  . STOMACH SURGERY  03/02/2012   ?volvus; "stomach came loose and twisted; went up under rib cage; had that corrected; tacked it up" (09/10/2013)     Allergies  Allergen Reactions  . Fentanyl Other (See Comments)    resp suppression per wife, out for 2 days       Family History  Problem Relation Age of Onset  . Hypertension Father   . Hypertension Mother      Social History Mr. Friedel reports that he has never smoked. He has never used smokeless tobacco. Mr. Orth reports that he does not drink alcohol.   Review of Systems CONSTITUTIONAL: No weight loss, fever, chills, weakness or fatigue.  HEENT: Eyes: No visual loss, blurred vision, double vision or yellow sclerae.No hearing loss, sneezing, congestion, runny nose or sore throat.  SKIN: No rash or itching.  CARDIOVASCULAR: per hpi RESPIRATORY: No shortness of breath, cough or sputum.  GASTROINTESTINAL: No anorexia, nausea, vomiting or diarrhea. No abdominal pain or blood.  GENITOURINARY: No burning on urination, no polyuria NEUROLOGICAL: No headache, dizziness, syncope, paralysis, ataxia, numbness or tingling in the extremities. No change in bowel or bladder control.  MUSCULOSKELETAL: No muscle, back pain, joint pain or stiffness.  LYMPHATICS: No enlarged nodes. No history of splenectomy.  PSYCHIATRIC: No history of depression or anxiety.  ENDOCRINOLOGIC: No reports of sweating, cold or heat intolerance. No polyuria or polydipsia.  Marland Kitchen   Physical Examination Vitals:   08/24/17 0905  BP: (!) 158/86  Pulse: 62  SpO2: 98%   Vitals:   08/24/17 0905  Weight: 196 lb (88.9 kg)  Height: 6\' 1"  (1.854 m)    Gen: resting comfortably, no acute distress HEENT: no scleral icterus, pupils equal round and reactive, no palptable cervical adenopathy,  CV: RRR, no m/r/g, no jvd Resp: Clear to auscultation bilaterally GI:  abdomen is soft, non-tender, non-distended, normal bowel sounds, no hepatosplenomegaly MSK: extremities are warm, no edema.  Skin: warm, no rash Neuro:  no focal deficits Psych: appropriate affect   Diagnostic Studies Cardiac Catheterization 9.17.2014  Procedural Findings:  Hemodynamics  RA 3 mmHg  RV 37/1 mmHg  PA 33/12 mmHg  PCWP 6 mmHg  LV 201/4 mHg . LVEDP: 12 mmHg  AO 202/94 mmHg  Oxygen saturations:  PA 74%  AO 94%  Cardiac Output (Fick) 6.72  Cardiac Index (Fick) 3.22  Pulmonary vascular resistance (  PVR): 1.9 Woods units.  Coronary angiography:  Coronary dominance: right   Left Main: Normal in size with no significant disease.   Left Anterior Descending (LAD): Large in size with mild diffuse atherosclerosis. There is diffuse 20% disease in the midsegment.   1st diagonal (D1): Normal in size with 80% ostial stenosis followed by a 95% proximal stenosis.   2nd diagonal (D2): Medium in size with minor irregularities.   3rd diagonal (D3): Normal in size with no significant disease.   Circumflex (LCx): Normal in size and nondominant. There is 20% mid stenosis.   1st obtuse marginal: Medium in size with minor irregularities.   2nd obtuse marginal: Large is in size with no significant disease.   3rd obtuse marginal: Normal in size with minor irregularities.   Right Coronary Artery: large in size and dominant. There is 20% stenosis distally.   Posterior descending artery: normal in size with no significant disease.   Posterior AV segment: normal in size with no significant disease.   Posterolateral branchs: PL 1 is large with no significant disease. PL 2 is small. Left ventriculography: Left ventricular systolic function is normal , LVEF is estimated at 60 %, there is no significant mitral regurgitation   07/2012 Echo: mild LVH, LVEF 40-45%, LAE, mild MR,     Assessment and Plan   1. Systolic dysfunction  - appears to have  normalized based on LV gram from cath  - no recent symptoms, continue current meds  2. CAD  - patent major arteries with evidence of disease in diags and OMs from prior cath - no recent symptoms - we will continue to monitor  3. HTN  - mildly elevated in clnic, recent troubles with low bp's requiring medications to be decreased during recent inpatient rehab stay - continue to monitor at this time.   4. Bradycardia  - continue to follow in device clinic  5. Hyperlipidemia - continue current statin  6. Afib - no recent symptoms - continue current meds including anticoagulation. CHADS2Vasc score is 5  F/u 6 months      Arnoldo Lenis, M.D

## 2017-08-24 NOTE — Patient Instructions (Signed)

## 2017-09-10 NOTE — Assessment & Plan Note (Signed)
He indicates he is compliant with his oral appliance and does feel it helps him sleep.

## 2017-09-10 NOTE — Assessment & Plan Note (Signed)
At his age this may be vasomotor, nonallergic rhinitis. I suggested he start with regular use of relatively inexpensive OTC Flonase.

## 2017-09-14 ENCOUNTER — Telehealth: Payer: Self-pay | Admitting: Internal Medicine

## 2017-09-14 NOTE — Telephone Encounter (Signed)
New message    [name] sent pt a letter stating that they would furnish apixaban (ELIQUIS) 5 MG TABS tablet from August 17th through December 31st. They state that it would either be sent to their home or they would need to pick it up. Pt is wondering what is going on and needs clarification on the authorization for this. They have been communicating with [facility] for this.

## 2017-09-15 ENCOUNTER — Telehealth: Payer: Self-pay | Admitting: Cardiology

## 2017-09-15 NOTE — Telephone Encounter (Signed)
**Note De-Identified  Obfuscation** The pts wife has been given [place] phone number to call and ask about the pts Eliquis and when it will arrive.

## 2017-09-15 NOTE — Telephone Encounter (Signed)
Requesting samples of Eliquis / tg  °

## 2017-09-15 NOTE — Telephone Encounter (Signed)
Called pt., no answer. Left him detailed message stating we had samples of Eliquis up front for him . Lot: [id number] Exp: dec 2020

## 2017-09-21 DIAGNOSIS — M4316 Spondylolisthesis, lumbar region: Secondary | ICD-10-CM | POA: Diagnosis not present

## 2017-09-21 DIAGNOSIS — M47816 Spondylosis without myelopathy or radiculopathy, lumbar region: Secondary | ICD-10-CM | POA: Diagnosis not present

## 2017-10-03 DIAGNOSIS — Z23 Encounter for immunization: Secondary | ICD-10-CM | POA: Diagnosis not present

## 2017-10-10 ENCOUNTER — Encounter (INDEPENDENT_AMBULATORY_CARE_PROVIDER_SITE_OTHER): Payer: Medicare Other | Admitting: Ophthalmology

## 2017-10-10 DIAGNOSIS — H353132 Nonexudative age-related macular degeneration, bilateral, intermediate dry stage: Secondary | ICD-10-CM

## 2017-10-10 DIAGNOSIS — H35033 Hypertensive retinopathy, bilateral: Secondary | ICD-10-CM

## 2017-10-10 DIAGNOSIS — H34832 Tributary (branch) retinal vein occlusion, left eye, with macular edema: Secondary | ICD-10-CM

## 2017-10-10 DIAGNOSIS — I10 Essential (primary) hypertension: Secondary | ICD-10-CM | POA: Diagnosis not present

## 2017-10-10 DIAGNOSIS — H4313 Vitreous hemorrhage, bilateral: Secondary | ICD-10-CM

## 2017-10-16 ENCOUNTER — Ambulatory Visit (INDEPENDENT_AMBULATORY_CARE_PROVIDER_SITE_OTHER): Payer: Medicare Other | Admitting: *Deleted

## 2017-10-16 DIAGNOSIS — I495 Sick sinus syndrome: Secondary | ICD-10-CM | POA: Diagnosis not present

## 2017-10-16 NOTE — Progress Notes (Signed)
Remote pacemaker transmission.   

## 2017-10-18 LAB — CUP PACEART REMOTE DEVICE CHECK
Battery Voltage: 2.98 V
Brady Statistic AP VP Percent: 59 %
Brady Statistic AP VS Percent: 1 %
Brady Statistic AS VS Percent: 1 %
Implantable Lead Implant Date: 20140919
Implantable Lead Implant Date: 20140919
Implantable Lead Location: 753859
Implantable Pulse Generator Implant Date: 20140919
Lead Channel Impedance Value: 440 Ohm
Lead Channel Pacing Threshold Amplitude: 0.625 V
Lead Channel Pacing Threshold Amplitude: 0.875 V
Lead Channel Pacing Threshold Pulse Width: 0.5 ms
Lead Channel Sensing Intrinsic Amplitude: 3.4 mV
Lead Channel Sensing Intrinsic Amplitude: 6.4 mV
Lead Channel Setting Pacing Amplitude: 0.875
Lead Channel Setting Pacing Amplitude: 1.875
Lead Channel Setting Pacing Pulse Width: 0.5 ms
MDC IDC LEAD LOCATION: 753860
MDC IDC MSMT BATTERY REMAINING LONGEVITY: 118 mo
MDC IDC MSMT BATTERY REMAINING PERCENTAGE: 95.5 %
MDC IDC MSMT LEADCHNL RA IMPEDANCE VALUE: 380 Ohm
MDC IDC MSMT LEADCHNL RA PACING THRESHOLD PULSEWIDTH: 0.5 ms
MDC IDC SESS DTM: 20181022073014
MDC IDC SET LEADCHNL RV SENSING SENSITIVITY: 2 mV
MDC IDC STAT BRADY AS VP PERCENT: 40 %
MDC IDC STAT BRADY RA PERCENT PACED: 59 %
MDC IDC STAT BRADY RV PERCENT PACED: 99 %
Pulse Gen Model: 2240
Pulse Gen Serial Number: 7536055

## 2017-10-19 ENCOUNTER — Ambulatory Visit (INDEPENDENT_AMBULATORY_CARE_PROVIDER_SITE_OTHER): Payer: Medicare Other | Admitting: Internal Medicine

## 2017-10-19 ENCOUNTER — Encounter: Payer: Self-pay | Admitting: Internal Medicine

## 2017-10-19 DIAGNOSIS — I2510 Atherosclerotic heart disease of native coronary artery without angina pectoris: Secondary | ICD-10-CM

## 2017-10-19 DIAGNOSIS — I495 Sick sinus syndrome: Secondary | ICD-10-CM | POA: Diagnosis not present

## 2017-10-19 MED ORDER — HYDROCHLOROTHIAZIDE 25 MG PO TABS: 25.0000 mg | ORAL_TABLET | Freq: Every day | ORAL | 3 refills | Status: DC

## 2017-10-19 NOTE — Progress Notes (Signed)
HPI [name] returns today for follow-up of his atrial fibrillation and symptomatic bradycardia. He is an 83 year old man with a history of paroxysmal atrial fibrillation,symptomatic bradycardia, status post pacemaker insertion, hypertension, and chronic peripheral edema. He has been sedentary in the past secondary to chronic back pain.his blood pressure has not been well-controlled. He is working 3 hours a day at the [employer] Allergies  Allergen Reactions  . Fentanyl Other (See Comments)    resp suppression per wife, out for 2 days      Current Outpatient Prescriptions  Medication Sig Dispense Refill  . acetaminophen (TYLENOL) 500 MG tablet Take 500 mg by mouth every 6 (six) hours as needed for pain.    Marland Kitchen alendronate (FOSAMAX) 70 MG tablet Take 70 mg by mouth every 7 (seven) days. Take with a full glass of water on an empty stomach. On Mondays    . apixaban (ELIQUIS) 5 MG TABS tablet Take 1 tablet (5 mg total) by mouth 2 (two) times daily. 180 tablet 3  . Calcium Carbonate-Vitamin D (CALCIUM 600 + D PO) Take 600 mg by mouth 2 (two) times daily.    . dorzolamide (TRUSOPT) 2 % ophthalmic solution Place 1 drop into the left eye 3 (three) times daily.     . dorzolamide-timolol (COSOPT) 22.3-6.8 MG/ML ophthalmic solution Place 1 drop into the left eye 2 (two) times daily.    . enalapril (VASOTEC) 20 MG tablet Take 1 tablet (20 mg total) by mouth 2 (two) times daily. 90 tablet 1  . fish oil-omega-3 fatty acids 1000 MG capsule Take 1 g by mouth 2 (two) times daily.      . isosorbide mononitrate (IMDUR) 30 MG 24 hr tablet Take 30 mg by mouth daily.    . Multiple Vitamin (MULTIVITAMIN WITH MINERALS) TABS tablet Take 1 tablet by mouth daily.    . nitroGLYCERIN (NITROSTAT) 0.4 MG SL tablet Place 1 tablet (0.4 mg total) under the tongue every 5 (five) minutes x 3 doses as needed for chest pain. 25 tablet 3  . oxybutynin (DITROPAN) 5 MG tablet Take 5 mg by mouth daily.    . pravastatin  (PRAVACHOL) 20 MG tablet Take 1 tablet (20 mg total) by mouth every evening. 90 tablet 3  . tamsulosin (FLOMAX) 0.4 MG CAPS capsule Take 0.4 mg by mouth daily.     . traMADol (ULTRAM) 50 MG tablet Take 50 mg by mouth every 6 (six) hours as needed.      No current facility-administered medications for this visit.      Past Medical History:  Diagnosis Date  . Allergic rhinitis   . Anemia   . Anxiety   . Arthritis    "all over my body" (09/10/2013)  . BPH (benign prostatic hypertrophy)   . CAD (coronary artery disease)    a. 08/2013 LM nl, LAD 36m, D1 80ost, 95p, LCX 33m, RCA 20d, EF 60%.  . Chronic lower back pain   . Depression   . Diabetes mellitus    "diet controlled" (09/10/2013)  . Diaphragmatic hernia without mention of obstruction or gangrene   . Diastolic congestive heart failure ([place])   . Elevated liver function tests   . Essential hypertension, benign   . Lacunar infarction    a. 08/2013 post-cath, MRI: sm acute lacunar infarcts in LPCA and LSCA, ? tiny RPICA lacunar infarct.  . Obesity   . OSA on CPAP    "wears mask part of the time" (09/10/2013)  .  Osteoporosis   . Sinus node dysfunction ([place])    a. 08/2013 s/p SJM DC PPM.    ROS:   All systems reviewed and negative except as noted in the HPI.   Past Surgical History:  Procedure Laterality Date  . CARDIAC CATHETERIZATION  1998  . CATARACT EXTRACTION W/ INTRAOCULAR LENS  IMPLANT, BILATERAL  2010  . LEFT AND RIGHT HEART CATHETERIZATION WITH CORONARY ANGIOGRAM N/A 09/11/2013   Procedure: LEFT AND RIGHT HEART CATHETERIZATION WITH CORONARY ANGIOGRAM;  Surgeon: Wellington Hampshire, MD;  Location: [facility] CATH LAB;  Service: Cardiovascular;  Laterality: N/A;  . PERMANENT PACEMAKER INSERTION N/A 09/13/2013   Procedure: PERMANENT PACEMAKER INSERTION;  Surgeon: Evans Lance, MD;  Location: [facility] CATH LAB;  Service: Cardiovascular;  Laterality: N/A;  . right hip fx  05/26/2016  . right wrist fx  05/26/2016  . STOMACH SURGERY   03/02/2012   ?volvus; "stomach came loose and twisted; went up under rib cage; had that corrected; tacked it up" (09/10/2013)     Family History  Problem Relation Age of Onset  . Hypertension Father   . Hypertension Mother      Social History   Social History  . Marital status: Married    Spouse name: N/A  . Number of children: 2  . Years of education: N/A   Occupational History  . 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 Smoking status: Never Smoker  . Smokeless tobacco: Never Used  . Alcohol use No  . Drug use: No  . Sexual activity: No   Other Topics Concern  . Not on file   Social History Narrative  . No narrative on file     BP (!) 200/102 (BP Location: Left Arm)   Pulse 60   Ht 6\' 1"  (1.854 m)   Wt 199 lb (90.3 kg)   SpO2 97%   BMI 26.25 kg/m  Blood pressure 190/100 on my exam Physical Exam:  Well appearing elderly man,NAD HEENT: Unremarkable Neck:  No JVD, no thyromegally Lymphatics:  No adenopathy Back:  No CVA tenderness Lungs:  Clear HEART:  Regular rate rhythm, no murmurs, no rubs, no clicks, soft S4 Abd:  soft, positive bowel sounds, no organomegally, no rebound, no guarding Ext:  2 plus pulses, no edema, no cyanosis, no clubbing Skin:  No rashes no nodules Neuro:  CN II through XII intact, motor grossly intact   DEVICE  Normal device function.  See PaceArt for details.   Assess/Plan: 1. Uncontrolled hypertension -I discussed the importance of getting his blood pressure under control. He is asymptomatic. We will start hydrochlorothiazide in conjunction with his ACE inhibitor. He will come back in one week for labs, specifically to check his kidney function and potassium. He is encouraged to maintain a low-sodium diet. 2. Sinus node dysfunction - he is currently asymptomatic status post pacemaker insertion. 3. Pacemaker - his St. Jude dual-chamber pacemaker is working normally. He has  approximately 10 years of battery longevity. We'll see him back in several months. 4.diastolic heart failure - is symptoms are class I. He is encouraged to reduce his salt intake. He will continue his current medications.  [name], M.D.

## 2017-10-19 NOTE — Patient Instructions (Addendum)
Medication Instructions:  Your physician recommends that you continue on your current medications as directed. Please refer to the Current Medication list given to you today. Start HCTZ 25 mg Daily    Labwork: Your physician recommends that you return for lab work in: 1 week after starting HCTZ    Testing/Procedures: NONE   Follow-Up: Your physician wants you to follow-up in: 1 Year with Dr. Lovena Le. You will receive a reminder letter in the mail two months in advance. If you don't receive a letter, please call our office to schedule the follow-up appointment.  Remote monitoring is used to monitor your Pacemaker of ICD from home. This monitoring reduces the number of office visits required to check your device to one time per year. It allows [place] to keep an eye on the functioning of your device to ensure it is working properly. You are scheduled for a device check from home on 01/08/18. You may send your transmission at any time that day. If you have a wireless device, the transmission will be sent automatically. After your physician reviews your transmission, you will receive a postcard with your next transmission date.    Any Other Special Instructions Will Be Listed Below (If Applicable).     If you need a refill on your cardiac medications before your next appointment, please call your pharmacy.  Thank you for choosing [facility]!

## 2017-10-20 ENCOUNTER — Encounter: Payer: Self-pay | Admitting: Cardiology

## 2017-10-27 ENCOUNTER — Other Ambulatory Visit (HOSPITAL_COMMUNITY)
Admission: RE | Admit: 2017-10-27 | Discharge: 2017-10-27 | Disposition: A | Discharge status: Home / Self Care | Payer: Medicare Other | Source: Ambulatory Visit | Attending: Internal Medicine | Admitting: Internal Medicine

## 2017-10-27 DIAGNOSIS — I495 Sick sinus syndrome: Secondary | ICD-10-CM | POA: Insufficient documentation

## 2017-10-27 LAB — BASIC METABOLIC PANEL
Anion gap: 8 (ref 5–15)
BUN: 26 mg/dL — AB (ref 6–20)
CALCIUM: 10.1 mg/dL (ref 8.9–10.3)
CHLORIDE: 101 mmol/L (ref 101–111)
CO2: 31 mmol/L (ref 22–32)
CREATININE: 1.11 mg/dL (ref 0.61–1.24)
GFR calc Af Amer: >60 mL/min (ref >60)
GFR calc non Af Amer: 60 mL/min — ABNORMAL LOW (ref >60)
Glucose, Bld: 106 mg/dL — ABNORMAL HIGH (ref 65–99)
Potassium: 3.3 mmol/L — ABNORMAL LOW (ref 3.5–5.1)
SODIUM: 140 mmol/L (ref 135–145)

## 2017-11-01 ENCOUNTER — Telehealth: Payer: Self-pay

## 2017-11-01 DIAGNOSIS — E876 Hypokalemia: Secondary | ICD-10-CM

## 2017-11-01 LAB — CUP PACEART INCLINIC DEVICE CHECK
Battery Remaining Longevity: 116 mo
Battery Voltage: 2.98 V
Date Time Interrogation Session: 20181107163050
Implantable Lead Implant Date: 20140919
Implantable Lead Location: 753860
Implantable Pulse Generator Implant Date: 20140919
Lead Channel Impedance Value: 440 Ohm
Lead Channel Pacing Threshold Amplitude: 0.75 V
Lead Channel Pacing Threshold Pulse Width: 0.5 ms
Lead Channel Sensing Intrinsic Amplitude: 6.3 mV
Lead Channel Setting Pacing Amplitude: 1.875
MDC IDC LEAD IMPLANT DT: 20140919
MDC IDC LEAD LOCATION: 753859
MDC IDC MSMT BATTERY REMAINING PERCENTAGE: 95 % — AB
MDC IDC MSMT LEADCHNL RA IMPEDANCE VALUE: 380 Ohm
MDC IDC MSMT LEADCHNL RA PACING THRESHOLD AMPLITUDE: 0.75 V
MDC IDC MSMT LEADCHNL RA SENSING INTR AMPL: 3.6 mV
MDC IDC MSMT LEADCHNL RV PACING THRESHOLD PULSEWIDTH: 0.5 ms
MDC IDC SET LEADCHNL RV PACING AMPLITUDE: 0.875
MDC IDC SET LEADCHNL RV PACING PULSEWIDTH: 0.5 ms
MDC IDC SET LEADCHNL RV SENSING SENSITIVITY: 2 mV
MDC IDC STAT BRADY RA PERCENT PACED: 59 %
MDC IDC STAT BRADY RV PERCENT PACED: 99 % — AB
Pulse Gen Model: 2240
Pulse Gen Serial Number: 7536055

## 2017-11-01 MED ORDER — POTASSIUM CHLORIDE CRYS ER 20 MEQ PO TBCR: 20.0000 meq | EXTENDED_RELEASE_TABLET | Freq: Every day | ORAL | 3 refills | Status: DC

## 2017-11-01 NOTE — Telephone Encounter (Signed)
Per Dr. Lovena Le, start K+ 20 meq daily for hypokalemia.  Pt aware.  Prescription sent to pharmacy.

## 2017-11-09 DIAGNOSIS — H401123 Primary open-angle glaucoma, left eye, severe stage: Secondary | ICD-10-CM | POA: Diagnosis not present

## 2017-11-09 DIAGNOSIS — Z961 Presence of intraocular lens: Secondary | ICD-10-CM | POA: Diagnosis not present

## 2017-11-09 DIAGNOSIS — H348322 Tributary (branch) retinal vein occlusion, left eye, stable: Secondary | ICD-10-CM | POA: Diagnosis not present

## 2017-11-14 DIAGNOSIS — I429 Cardiomyopathy, unspecified: Secondary | ICD-10-CM | POA: Diagnosis not present

## 2017-11-14 DIAGNOSIS — Z6828 Body mass index (BMI) 28.0-28.9, adult: Secondary | ICD-10-CM | POA: Diagnosis not present

## 2017-11-14 DIAGNOSIS — I4891 Unspecified atrial fibrillation: Secondary | ICD-10-CM | POA: Diagnosis not present

## 2017-11-14 DIAGNOSIS — E1165 Type 2 diabetes mellitus with hyperglycemia: Secondary | ICD-10-CM | POA: Diagnosis not present

## 2017-11-14 DIAGNOSIS — Z299 Encounter for prophylactic measures, unspecified: Secondary | ICD-10-CM | POA: Diagnosis not present

## 2017-12-13 ENCOUNTER — Encounter (INDEPENDENT_AMBULATORY_CARE_PROVIDER_SITE_OTHER): Payer: Medicare Other | Admitting: Ophthalmology

## 2017-12-20 DIAGNOSIS — Z0000 Encounter for general adult medical examination without abnormal findings: Secondary | ICD-10-CM | POA: Diagnosis not present

## 2017-12-20 DIAGNOSIS — Z299 Encounter for prophylactic measures, unspecified: Secondary | ICD-10-CM | POA: Diagnosis not present

## 2017-12-20 DIAGNOSIS — Z87891 Personal history of nicotine dependence: Secondary | ICD-10-CM | POA: Diagnosis not present

## 2017-12-20 DIAGNOSIS — Z125 Encounter for screening for malignant neoplasm of prostate: Secondary | ICD-10-CM | POA: Diagnosis not present

## 2017-12-20 DIAGNOSIS — E7800 Pure hypercholesterolemia, unspecified: Secondary | ICD-10-CM | POA: Diagnosis not present

## 2017-12-20 DIAGNOSIS — I10 Essential (primary) hypertension: Secondary | ICD-10-CM | POA: Diagnosis not present

## 2017-12-20 DIAGNOSIS — Z79899 Other long term (current) drug therapy: Secondary | ICD-10-CM | POA: Diagnosis not present

## 2017-12-20 DIAGNOSIS — Z1339 Encounter for screening examination for other mental health and behavioral disorders: Secondary | ICD-10-CM | POA: Diagnosis not present

## 2017-12-20 DIAGNOSIS — Z1211 Encounter for screening for malignant neoplasm of colon: Secondary | ICD-10-CM | POA: Diagnosis not present

## 2017-12-20 DIAGNOSIS — Z1331 Encounter for screening for depression: Secondary | ICD-10-CM | POA: Diagnosis not present

## 2017-12-20 DIAGNOSIS — R5383 Other fatigue: Secondary | ICD-10-CM | POA: Diagnosis not present

## 2017-12-20 DIAGNOSIS — Z6828 Body mass index (BMI) 28.0-28.9, adult: Secondary | ICD-10-CM | POA: Diagnosis not present

## 2017-12-20 DIAGNOSIS — Z7189 Other specified counseling: Secondary | ICD-10-CM | POA: Diagnosis not present

## 2018-01-09 ENCOUNTER — Other Ambulatory Visit: Payer: Self-pay | Admitting: Adult Health

## 2018-01-10 ENCOUNTER — Encounter (INDEPENDENT_AMBULATORY_CARE_PROVIDER_SITE_OTHER): Payer: Medicare Other | Admitting: Ophthalmology

## 2018-01-10 DIAGNOSIS — H35033 Hypertensive retinopathy, bilateral: Secondary | ICD-10-CM

## 2018-01-10 DIAGNOSIS — I10 Essential (primary) hypertension: Secondary | ICD-10-CM | POA: Diagnosis not present

## 2018-01-10 DIAGNOSIS — H34832 Tributary (branch) retinal vein occlusion, left eye, with macular edema: Secondary | ICD-10-CM

## 2018-01-10 DIAGNOSIS — H353132 Nonexudative age-related macular degeneration, bilateral, intermediate dry stage: Secondary | ICD-10-CM

## 2018-01-10 DIAGNOSIS — H43813 Vitreous degeneration, bilateral: Secondary | ICD-10-CM

## 2018-01-15 ENCOUNTER — Ambulatory Visit (INDEPENDENT_AMBULATORY_CARE_PROVIDER_SITE_OTHER): Payer: Medicare Other | Admitting: *Deleted

## 2018-01-15 DIAGNOSIS — I495 Sick sinus syndrome: Secondary | ICD-10-CM | POA: Diagnosis not present

## 2018-01-15 NOTE — Progress Notes (Signed)
Remote pacemaker transmission.   

## 2018-01-16 ENCOUNTER — Encounter: Payer: Self-pay | Admitting: Cardiology

## 2018-02-03 ENCOUNTER — Other Ambulatory Visit: Payer: Self-pay

## 2018-02-03 ENCOUNTER — Emergency Department (HOSPITAL_COMMUNITY)
Admission: EM | Admit: 2018-02-03 | Discharge: 2018-02-03 | Disposition: A | Discharge status: Home / Self Care | Payer: Medicare Other | Attending: Emergency Medicine | Admitting: Emergency Medicine

## 2018-02-03 ENCOUNTER — Encounter (HOSPITAL_COMMUNITY): Payer: Self-pay | Admitting: Emergency Medicine

## 2018-02-03 DIAGNOSIS — E119 Type 2 diabetes mellitus without complications: Secondary | ICD-10-CM | POA: Diagnosis not present

## 2018-02-03 DIAGNOSIS — Z7901 Long term (current) use of anticoagulants: Secondary | ICD-10-CM | POA: Insufficient documentation

## 2018-02-03 DIAGNOSIS — I110 Hypertensive heart disease with heart failure: Secondary | ICD-10-CM | POA: Diagnosis not present

## 2018-02-03 DIAGNOSIS — I2510 Atherosclerotic heart disease of native coronary artery without angina pectoris: Secondary | ICD-10-CM | POA: Diagnosis not present

## 2018-02-03 DIAGNOSIS — Z79899 Other long term (current) drug therapy: Secondary | ICD-10-CM | POA: Insufficient documentation

## 2018-02-03 DIAGNOSIS — R040 Epistaxis: Secondary | ICD-10-CM

## 2018-02-03 DIAGNOSIS — I5030 Unspecified diastolic (congestive) heart failure: Secondary | ICD-10-CM | POA: Diagnosis not present

## 2018-02-03 DIAGNOSIS — Z950 Presence of cardiac pacemaker: Secondary | ICD-10-CM | POA: Insufficient documentation

## 2018-02-03 DIAGNOSIS — Z7902 Long term (current) use of antithrombotics/antiplatelets: Secondary | ICD-10-CM | POA: Diagnosis not present

## 2018-02-03 LAB — CBC WITH DIFFERENTIAL/PLATELET
BASOS ABS: 0 10*3/uL (ref 0.0–0.1)
Basophils Relative: 0 %
Eosinophils Absolute: 0.1 10*3/uL (ref 0.0–0.7)
Eosinophils Relative: 1 %
HCT: 42.7 % (ref 39.0–52.0)
Hemoglobin: 14.4 g/dL (ref 13.0–17.0)
LYMPHS PCT: 20 %
Lymphs Abs: 1.8 10*3/uL (ref 0.7–4.0)
MCH: 31.7 pg (ref 26.0–34.0)
MCHC: 33.7 g/dL (ref 30.0–36.0)
MCV: 94.1 fL (ref 78.0–100.0)
Monocytes Absolute: 1.1 10*3/uL — ABNORMAL HIGH (ref 0.1–1.0)
Monocytes Relative: 12 %
NEUTROS PCT: 67 %
Neutro Abs: 5.9 10*3/uL (ref 1.7–7.7)
PLATELETS: 194 10*3/uL (ref 150–400)
RBC: 4.54 MIL/uL (ref 4.22–5.81)
RDW: 13.2 % (ref 11.5–15.5)
WBC: 8.7 10*3/uL (ref 4.0–10.5)

## 2018-02-03 LAB — BASIC METABOLIC PANEL
ANION GAP: 10 (ref 5–15)
BUN: 27 mg/dL — ABNORMAL HIGH (ref 6–20)
CO2: 25 mmol/L (ref 22–32)
Calcium: 9.2 mg/dL (ref 8.9–10.3)
Chloride: 106 mmol/L (ref 101–111)
Creatinine, Ser: 1.04 mg/dL (ref 0.61–1.24)
GFR calc Af Amer: >60 mL/min (ref >60)
Glucose, Bld: 136 mg/dL — ABNORMAL HIGH (ref 65–99)
Potassium: 3.8 mmol/L (ref 3.5–5.1)
SODIUM: 141 mmol/L (ref 135–145)

## 2018-02-03 MED ORDER — OXYMETAZOLINE HCL 0.05 % NA SOLN
NASAL | Status: AC
  Administered 2018-02-03: 2
  Filled 2018-02-03: qty 15

## 2018-02-03 MED ORDER — OXYMETAZOLINE HCL 0.05 % NA SOLN
1.0000 | Freq: Once | NASAL | Status: AC
  Administered 2018-02-03: 1 via NASAL

## 2018-02-03 MED ORDER — ENALAPRIL MALEATE 20 MG PO TABS
20.0000 mg | ORAL_TABLET | Freq: Once | ORAL | Status: AC
  Administered 2018-02-03: 20 mg via ORAL
  Filled 2018-02-03: qty 1

## 2018-02-03 NOTE — ED Provider Notes (Signed)
[facility] EMERGENCY DEPARTMENT Provider Note   CSN: [id number] Arrival date & time: 02/03/18  1756     History   Chief Complaint Chief Complaint  Patient presents with  . Epistaxis    HPI [name] is a 84 y.o. male.  Patient with onset of nosebleed last evening.  He is on the blood thinner Eliquis.  He is on that for atrial fibrillation.  Bleeding started again today at 1530.  And was more steady.  Now is stopped.  No evidence of any nose trauma.  No history of nosebleeds before.  No change in his blood thinner regimen.  Bleeding has been from the right side of the nose.  Patient does have a pacemaker.      Past Medical History:  Diagnosis Date  . Allergic rhinitis   . Anemia   . Anxiety   . Arthritis    "all over my body" (09/10/2013)  . BPH (benign prostatic hypertrophy)   . CAD (coronary artery disease)    a. 08/2013 LM nl, LAD 40m, D1 80ost, 95p, LCX 84m, RCA 20d, EF 60%.  . Chronic lower back pain   . Depression   . Diabetes mellitus    "diet controlled" (09/10/2013)  . Diaphragmatic hernia without mention of obstruction or gangrene   . Diastolic congestive heart failure ([place])   . Elevated liver function tests   . Essential hypertension, benign   . Lacunar infarction    a. 08/2013 post-cath, MRI: sm acute lacunar infarcts in LPCA and LSCA, ? tiny RPICA lacunar infarct.  . Obesity   . OSA on CPAP    "wears mask part of the time" (09/10/2013)  . Osteoporosis   . Sinus node dysfunction ([place])    a. 08/2013 s/p SJM DC PPM.    Patient Active Problem List   Diagnosis Date Noted  . Diet-controlled diabetes mellitus ([place]) 05/23/2016  . Pain in the chest   . Chest pain 05/22/2016  . Atrial fibrillation ([place]) 05/22/2016  . Hypokalemia 05/22/2016  . Pacemaker 12/02/2013  . Sinus node dysfunction ([place]) 09/14/2013  . Multiple lacunar infarcts 09/14/2013  . CAD (coronary artery disease) 09/14/2013  . PSVT (paroxysmal supraventricular tachycardia) ([place])  09/14/2013  . Altered mental status 09/11/2013  . Peripheral edema 01/27/2013  . CHF 02/22/2010  . DM 02/19/2010  . HYPOPOTASSEMIA 02/19/2010  . Essential hypertension 02/19/2010  . Diastolic heart failure ([place]) 02/19/2010  . CHEST PAIN UNSPECIFIED 02/19/2010  . Obstructive sleep apnea 11/13/2007  . Allergic rhinitis 11/13/2007  . DYSPNEA 11/13/2007  . OBESITY, NOS 02/22/2007  . ANEMIA, IRON DEFICIENCY, UNSPEC. 02/22/2007  . ANXIETY 02/22/2007  . HERNIA, HIATAL, NONCONGENITAL 02/22/2007    Past Surgical History:  Procedure Laterality Date  . CARDIAC CATHETERIZATION  1998  . CATARACT EXTRACTION W/ INTRAOCULAR LENS  IMPLANT, BILATERAL  2010  . LEFT AND RIGHT HEART CATHETERIZATION WITH CORONARY ANGIOGRAM N/A 09/11/2013   Procedure: LEFT AND RIGHT HEART CATHETERIZATION WITH CORONARY ANGIOGRAM;  Surgeon: Wellington Hampshire, MD;  Location: [facility] CATH LAB;  Service: Cardiovascular;  Laterality: N/A;  . PERMANENT PACEMAKER INSERTION N/A 09/13/2013   Procedure: PERMANENT PACEMAKER INSERTION;  Surgeon: Evans Lance, MD;  Location[facility] CATH LAB;  Service: Cardiovascular;  Laterality: N/A;  . right hip fx  05/26/2016  . right wrist fx  05/26/2016  . STOMACH SURGERY  03/02/2012   ?volvus; "stomach came loose and twisted; went up under rib cage; had that corrected; tacked it up" (09/10/2013)  Home Medications    Prior to Admission medications   Medication Sig Start Date End Date Taking? Authorizing Provider  alendronate (FOSAMAX) 70 MG tablet Take 70 mg by mouth every 7 (seven) days. Take with a full glass of water on an empty stomach. On Mondays   Yes [provider]  apixaban (ELIQUIS) 5 MG TABS tablet Take 1 tablet (5 mg total) by mouth 2 (two) times daily. 07/27/17  Yes Evans Lance, MD  Calcium Carbonate-Vitamin D (CALCIUM 600 + D PO) Take 600 mg by mouth 2 (two) times daily.   Yes [provider]  dorzolamide (TRUSOPT) 2 % ophthalmic solution Place 1 drop into the  left eye 3 (three) times daily.  05/12/16  Yes [provider]  dorzolamide-timolol (COSOPT) 22.3-6.8 MG/ML ophthalmic solution Place 1 drop into the left eye 2 (two) times daily.   Yes [provider]  enalapril (VASOTEC) 20 MG tablet Take 1 tablet (20 mg total) by mouth 2 (two) times daily. 01/23/17  Yes Lendon Colonel, NP  fish oil-omega-3 fatty acids 1000 MG capsule Take 1 g by mouth 2 (two) times daily.     Yes [provider]  hydrochlorothiazide (HYDRODIURIL) 25 MG tablet Take 1 tablet (25 mg total) by mouth daily. 10/19/17 02/03/18 Yes Evans Lance, MD  isosorbide mononitrate (IMDUR) 30 MG 24 hr tablet Take 30 mg by mouth daily.   Yes [provider]  Multiple Vitamin (MULTIVITAMIN WITH MINERALS) TABS tablet Take 1 tablet by mouth daily.   Yes [provider]  nitroGLYCERIN (NITROSTAT) 0.4 MG SL tablet Place 1 tablet (0.4 mg total) under the tongue every 5 (five) minutes x 3 doses as needed for chest pain. 10/17/16  Yes Evans Lance, MD  oxybutynin (DITROPAN) 5 MG tablet Take 5 mg by mouth daily.   Yes [provider]  potassium chloride SA (K-DUR,KLOR-CON) 20 MEQ tablet Take 1 tablet (20 mEq total) daily by mouth. 11/01/17  Yes Evans Lance, MD  pravastatin (PRAVACHOL) 20 MG tablet TAKE 1 TABLET (20 MG TOTAL) BY MOUTH EVERY EVENING. 01/09/18  Yes Branch, Alphonse Guild, MD  tamsulosin (FLOMAX) 0.4 MG CAPS capsule Take 0.4 mg by mouth daily.  04/30/15  Yes [provider]  traMADol (ULTRAM) 50 MG tablet Take 50 mg by mouth every 6 (six) hours as needed.    Yes [provider]  acetaminophen (TYLENOL) 500 MG tablet Take 500 mg by mouth every 6 (six) hours as needed for pain.    [provider]    Family History Family History  Problem Relation Age of Onset  . Hypertension Father   . Hypertension Mother     Social History Social History   Tobacco Use  . Smoking status: Never Smoker  . Smokeless  tobacco: Never Used  Substance Use Topics  . Alcohol use: No    Alcohol/week: 0.0 oz  . Drug use: No     Allergies   Fentanyl   Review of Systems Review of Systems  Constitutional: Negative for fever.  HENT: Positive for nosebleeds. Negative for congestion.   Eyes: Negative for visual disturbance.  Respiratory: Negative for shortness of breath.   Cardiovascular: Negative for chest pain.  Gastrointestinal: Negative for abdominal pain.  Genitourinary: Negative for dysuria.  Neurological: Negative for syncope and headaches.  Hematological: Bruises/bleeds easily.  Psychiatric/Behavioral: Negative for confusion.     Physical Exam Updated Vital Signs BP (!) 197/104 (BP Location: Right Arm)   Pulse 60  Temp 97.8 F (36.6 C) (Oral)   Resp 18   SpO2 96%   Physical Exam  Constitutional: He appears well-developed and well-nourished. No distress.  HENT:  Head: Normocephalic and atraumatic.  Mouth/Throat: Oropharynx is clear and moist.  Fresh blood to the right nares.  No active bleeding.  No bleeding down the back of the throat.  Left nares clear.  Eyes: Conjunctivae and EOM are normal. Pupils are equal, round, and reactive to light.  Neck: Neck supple.  Nursing note and vitals reviewed.    ED Treatments / Results  Labs (all labs ordered are listed, but only abnormal results are displayed) Labs Reviewed  CBC WITH DIFFERENTIAL/PLATELET - Abnormal; Notable for the following components:      Result Value   Monocytes Absolute 1.1 (*)    All other components within normal limits  BASIC METABOLIC PANEL - Abnormal; Notable for the following components:   Glucose, Bld 136 (*)    BUN 27 (*)    All other components within normal limits    EKG  EKG Interpretation None       Radiology No results found.  Procedures Procedures (including critical care time)  Medications Ordered in ED Medications  enalapril (VASOTEC) tablet 20 mg (20 mg Oral Given 02/03/18 2307)      Initial Impression / Assessment and Plan / ED Course  I have reviewed the triage vital signs and the nursing notes.  Pertinent labs & imaging results that were available during my care of the patient were reviewed by me and considered in my medical decision making (see chart for details).   Patient presenting with nosebleed.  Is on the blood thinner Eliquis.  The bleed has been intermittent.  Started last night resolved and then started again today.  Today it began at about 1530.  No prior history of nosebleeds.  Bleeding stopped spontaneously here.  Patient was given a spray of Afrin.  Does have hypertension but just 1 spray also was treated with his evening dose of his Vasotec.  No further bleeding.  Patient stable for discharge home.  Instructions given on specifically what to do with the bleed recurs and referral to ear nose and throat provided.  At time of discharge patient nontoxic no acute distress.    Final Clinical Impressions(s) / ED Diagnoses   Final diagnoses:  Right-sided epistaxis    ED Discharge Orders    None       Fredia Sorrow, MD 02/05/18 [phone]

## 2018-02-03 NOTE — ED Triage Notes (Signed)
Pt reports nose bleed starting last night and bleeding through the night.  Began bleeding again at 1530 and began bleeding steadily for the past hour.  Pt holding pressure at triage, saturated towel from home.

## 2018-02-03 NOTE — Discharge Instructions (Signed)
Make an appointment to follow-up with ear nose and throat.  Information provided above.  Follow the instructions that we gave you with the nose starts to rebleed.  If the pinching technique does not work after blowing all the clots out then will need to be evaluated again.  Try to be very gentle with your nose.

## 2018-02-03 NOTE — ED Notes (Signed)
Vasotec tablet is not available to give at this time. Cherlyn Roberts, RN is aware of the need for this medication from main pharmacy.

## 2018-02-08 DIAGNOSIS — R040 Epistaxis: Secondary | ICD-10-CM | POA: Diagnosis not present

## 2018-02-10 LAB — CUP PACEART REMOTE DEVICE CHECK
Battery Remaining Longevity: 116 mo
Battery Voltage: 2.98 V
Brady Statistic AS VP Percent: 37 %
Brady Statistic AS VS Percent: 1 %
Brady Statistic RA Percent Paced: 62 %
Date Time Interrogation Session: 20190121101435
Implantable Lead Implant Date: 20140919
Implantable Lead Location: 753859
Implantable Lead Location: 753860
Lead Channel Impedance Value: 360 Ohm
Lead Channel Pacing Threshold Amplitude: 1.125 V
Lead Channel Pacing Threshold Pulse Width: 0.5 ms
Lead Channel Pacing Threshold Pulse Width: 0.5 ms
Lead Channel Sensing Intrinsic Amplitude: 7.5 mV
Lead Channel Setting Pacing Amplitude: 0.875
Lead Channel Setting Pacing Amplitude: 2.125
MDC IDC LEAD IMPLANT DT: 20140919
MDC IDC MSMT BATTERY REMAINING PERCENTAGE: 95.5 %
MDC IDC MSMT LEADCHNL RA SENSING INTR AMPL: 3.7 mV
MDC IDC MSMT LEADCHNL RV IMPEDANCE VALUE: 400 Ohm
MDC IDC MSMT LEADCHNL RV PACING THRESHOLD AMPLITUDE: 0.625 V
MDC IDC PG IMPLANT DT: 20140919
MDC IDC SET LEADCHNL RV PACING PULSEWIDTH: 0.5 ms
MDC IDC SET LEADCHNL RV SENSING SENSITIVITY: 2 mV
MDC IDC STAT BRADY AP VP PERCENT: 62 %
MDC IDC STAT BRADY AP VS PERCENT: 1 %
MDC IDC STAT BRADY RV PERCENT PACED: 99 %
Pulse Gen Model: 2240
Pulse Gen Serial Number: 7536055

## 2018-02-14 ENCOUNTER — Encounter: Payer: Self-pay | Admitting: Cardiology

## 2018-02-14 ENCOUNTER — Ambulatory Visit (INDEPENDENT_AMBULATORY_CARE_PROVIDER_SITE_OTHER): Payer: Medicare Other | Admitting: Cardiology

## 2018-02-14 VITALS — BP 168/80 | HR 73 | Ht 73.0 in | Wt 203.0 lb

## 2018-02-14 DIAGNOSIS — I10 Essential (primary) hypertension: Secondary | ICD-10-CM | POA: Diagnosis not present

## 2018-02-14 DIAGNOSIS — I4891 Unspecified atrial fibrillation: Secondary | ICD-10-CM | POA: Diagnosis not present

## 2018-02-14 DIAGNOSIS — I2510 Atherosclerotic heart disease of native coronary artery without angina pectoris: Secondary | ICD-10-CM | POA: Diagnosis not present

## 2018-02-14 DIAGNOSIS — E782 Mixed hyperlipidemia: Secondary | ICD-10-CM

## 2018-02-14 NOTE — Progress Notes (Signed)
Clinical Summary [name] is a 84 y.o.male seen today for follow up of the following medical problems.   1. Mild systolic dysfunction  - echo 07/2012 w/ LVEF 40-45%  - Cath 08/2013 w/ minimal CAD, LV gram 60% with normal filling pressures on RHC.    - no recent SOB/DOE. No recent edema - compliant with meds.   2. HTN  - home bp's 120s/80s per his report, though elevated today - he is not interested in med changes.   3. Bradycardia  -s/p [employer] Jude dual chamber pacemaker   - normal device function Jan 2019 - no recent symptoms   4. Hyperlipidemia - compliant with statin  5. Afib  - recent nosebleed, seen in ER 02/03/18. Has resolved - no recent palpitations.    SH: wife on chemo and radiation along with recent hysterectomy, endometrial cancer.  Past Medical History:  Diagnosis Date  . Allergic rhinitis   . Anemia   . Anxiety   . Arthritis    "all over my body" (09/10/2013)  . BPH (benign prostatic hypertrophy)   . CAD (coronary artery disease)    a. 08/2013 LM nl, LAD 45m, D1 80ost, 95p, LCX 64m, RCA 20d, EF 60%.  . Chronic lower back pain   . Depression   . Diabetes mellitus    "diet controlled" (09/10/2013)  . Diaphragmatic hernia without mention of obstruction or gangrene   . Diastolic congestive heart failure ([place])   . Elevated liver function tests   . Essential hypertension, benign   . Lacunar infarction    a. 08/2013 post-cath, MRI: sm acute lacunar infarcts in LPCA and LSCA, ? tiny RPICA lacunar infarct.  . Obesity   . OSA on CPAP    "wears mask part of the time" (09/10/2013)  . Osteoporosis   . Sinus node dysfunction ([place])    a. 08/2013 s/p SJM DC PPM.     Allergies  Allergen Reactions  . Fentanyl Other (See Comments)    resp suppression per wife, out for 2 days      Current Outpatient Medications  Medication Sig Dispense Refill  . acetaminophen (TYLENOL) 500 MG tablet Take 500 mg by mouth every 6 (six) hours as needed for  pain.    Marland Kitchen alendronate (FOSAMAX) 70 MG tablet Take 70 mg by mouth every 7 (seven) days. Take with a full glass of water on an empty stomach. On Mondays    . apixaban (ELIQUIS) 5 MG TABS tablet Take 1 tablet (5 mg total) by mouth 2 (two) times daily. 180 tablet 3  . Calcium Carbonate-Vitamin D (CALCIUM 600 + D PO) Take 600 mg by mouth 2 (two) times daily.    . dorzolamide (TRUSOPT) 2 % ophthalmic solution Place 1 drop into the left eye 3 (three) times daily.     . dorzolamide-timolol (COSOPT) 22.3-6.8 MG/ML ophthalmic solution Place 1 drop into the left eye 2 (two) times daily.    . enalapril (VASOTEC) 20 MG tablet Take 1 tablet (20 mg total) by mouth 2 (two) times daily. 90 tablet 1  . fish oil-omega-3 fatty acids 1000 MG capsule Take 1 g by mouth 2 (two) times daily.      . hydrochlorothiazide (HYDRODIURIL) 25 MG tablet Take 1 tablet (25 mg total) by mouth daily. 90 tablet 3  . isosorbide mononitrate (IMDUR) 30 MG 24 hr tablet Take 30 mg by mouth daily.    . Multiple Vitamin (MULTIVITAMIN WITH MINERALS) TABS tablet Take 1 tablet by  mouth daily.    . nitroGLYCERIN (NITROSTAT) 0.4 MG SL tablet Place 1 tablet (0.4 mg total) under the tongue every 5 (five) minutes x 3 doses as needed for chest pain. 25 tablet 3  . oxybutynin (DITROPAN) 5 MG tablet Take 5 mg by mouth daily.    . potassium chloride SA (K-DUR,KLOR-CON) 20 MEQ tablet Take 1 tablet (20 mEq total) daily by mouth. 90 tablet 3  . pravastatin (PRAVACHOL) 20 MG tablet TAKE 1 TABLET (20 MG TOTAL) BY MOUTH EVERY EVENING. 90 tablet 3  . tamsulosin (FLOMAX) 0.4 MG CAPS capsule Take 0.4 mg by mouth daily.     . traMADol (ULTRAM) 50 MG tablet Take 50 mg by mouth every 6 (six) hours as needed.      No current facility-administered medications for this visit.      Past Surgical History:  Procedure Laterality Date  . CARDIAC CATHETERIZATION  1998  . CATARACT EXTRACTION W/ INTRAOCULAR LENS  IMPLANT, BILATERAL  2010  . LEFT AND RIGHT HEART  CATHETERIZATION WITH CORONARY ANGIOGRAM N/A 09/11/2013   Procedure: LEFT AND RIGHT HEART CATHETERIZATION WITH CORONARY ANGIOGRAM;  Surgeon: Wellington Hampshire, MD;  Location: [facility] CATH LAB;  Service: Cardiovascular;  Laterality: N/A;  . PERMANENT PACEMAKER INSERTION N/A 09/13/2013   Procedure: PERMANENT PACEMAKER INSERTION;  Surgeon: Evans Lance, MD;  Location: [facility] CATH LAB;  Service: Cardiovascular;  Laterality: N/A;  . right hip fx  05/26/2016  . right wrist fx  05/26/2016  . STOMACH SURGERY  03/02/2012   ?volvus; "stomach came loose and twisted; went up under rib cage; had that corrected; tacked it up" (09/10/2013)     Allergies  Allergen Reactions  . Fentanyl Other (See Comments)    resp suppression per wife, out for 2 days       Family History  Problem Relation Age of Onset  . Hypertension Father   . Hypertension Mother      Social History [name] reports that  has never smoked. he has never used smokeless tobacco. [name] reports that he does not drink alcohol.   Review of Systems CONSTITUTIONAL: No weight loss, fever, chills, weakness or fatigue.  HEENT: Eyes: No visual loss, blurred vision, double vision or yellow sclerae.No hearing loss, sneezing, congestion, runny nose or sore throat.  SKIN: No rash or itching.  CARDIOVASCULAR: per hpi RESPIRATORY: No shortness of breath, cough or sputum.  GASTROINTESTINAL: No anorexia, nausea, vomiting or diarrhea. No abdominal pain or blood.  GENITOURINARY: No burning on urination, no polyuria NEUROLOGICAL: No headache, dizziness, syncope, paralysis, ataxia, numbness or tingling in the extremities. No change in bowel or bladder control.  MUSCULOSKELETAL: No muscle, back pain, joint pain or stiffness.  LYMPHATICS: No enlarged nodes. No history of splenectomy.  PSYCHIATRIC: No history of depression or anxiety.  ENDOCRINOLOGIC: No reports of sweating, cold or heat intolerance. No polyuria or polydipsia.  Marland Kitchen   Physical  Examination Vitals:   02/14/18 1259  BP: (!) 168/80  Pulse: 73  SpO2: 96%   Vitals:   02/14/18 1259  Weight: 203 lb (92.1 kg)  Height: 6\' 1"  (1.854 m)    Gen: resting comfortably, no acute distress HEENT: no scleral icterus, pupils equal round and reactive, no palptable cervical adenopathy,  CV: RRR, no m/r/g, no jvd Resp: Clear to auscultation bilaterally GI: abdomen is soft, non-tender, non-distended, normal bowel sounds, no hepatosplenomegaly MSK: extremities are warm, no edema.  Skin: warm, no rash Neuro:  no focal deficits Psych: appropriate affect  Diagnostic Studies Cardiac Catheterization 9.17.2014 Procedural Findings:  Hemodynamics RA 3 mmHg  RV 37/1 mmHg  PA 33/12 mmHg  PCWP 6 mmHg  LV 201/4 mHg . LVEDP: 12 mmHg  AO 202/94 mmHg  Oxygen saturations:  PA 74%  AO 94%  Cardiac Output (Fick) 6.72  Cardiac Index (Fick) 3.22  Pulmonary vascular resistance (PVR): 1.9 [place] units.  Coronary angiography: Coronary dominance: right   Left Main: Normal in size with no significant disease.   Left Anterior Descending (LAD): Large in size with mild diffuse atherosclerosis. There is diffuse 20% disease in the midsegment.   1st diagonal (D1): Normal in size with 80% ostial stenosis followed by a 95% proximal stenosis.   2nd diagonal (D2): Medium in size with minor irregularities.   3rd diagonal (D3): Normal in size with no significant disease.   Circumflex (LCx): Normal in size and nondominant. There is 20% mid stenosis.   1st obtuse marginal: Medium in size with minor irregularities.   2nd obtuse marginal: Large is in size with no significant disease.   3rd obtuse marginal: Normal in size with minor irregularities.   Right Coronary Artery: large in size and dominant. There is 20% stenosis distally.   Posterior descending artery: normal in size with no significant disease.   Posterior AV segment: normal in size with no  significant disease.   Posterolateral branchs: PL 1 is large with no significant disease. PL 2 is small. Left ventriculography: Left ventricular systolic function is normal , LVEF is estimated at 60 %, there is no significant mitral regurgitation   07/2012 Echo: mild LVH, LVEF 40-45%, LAE, mild MR,     Assessment and Plan  1. Systolic dysfunction  - appears to have normalized based on LV gram from cath  -no recent symptoms, continue to monitor.   2. CAD  - patent major arteries with evidence of disease in diags and OMs from prior cath - no symptoms, continue current meds  3. HTN  - elevated in clinic, home numbers at goal though last few clinical visits have been elevated - continue current meds at this time. He is resistant to medication changes  4. Bradycardia  - continue to follow in device clinic - no recent symptoms  5. Hyperlipidemia - he will continue statin, request labs from pcp  6. Afib CHADS2Vasc score is 5, continue anticoag - no symptoms, continue current meds    F/u 6 months   Arnoldo Lenis, M.D.

## 2018-02-14 NOTE — Patient Instructions (Signed)

## 2018-02-16 ENCOUNTER — Encounter: Payer: Self-pay | Admitting: Cardiology

## 2018-02-21 DIAGNOSIS — I10 Essential (primary) hypertension: Secondary | ICD-10-CM | POA: Diagnosis not present

## 2018-02-21 DIAGNOSIS — I429 Cardiomyopathy, unspecified: Secondary | ICD-10-CM | POA: Diagnosis not present

## 2018-02-21 DIAGNOSIS — Z299 Encounter for prophylactic measures, unspecified: Secondary | ICD-10-CM | POA: Diagnosis not present

## 2018-02-21 DIAGNOSIS — E1165 Type 2 diabetes mellitus with hyperglycemia: Secondary | ICD-10-CM | POA: Diagnosis not present

## 2018-02-21 DIAGNOSIS — Z6829 Body mass index (BMI) 29.0-29.9, adult: Secondary | ICD-10-CM | POA: Diagnosis not present

## 2018-02-21 DIAGNOSIS — I4891 Unspecified atrial fibrillation: Secondary | ICD-10-CM | POA: Diagnosis not present

## 2018-03-02 ENCOUNTER — Other Ambulatory Visit: Payer: Self-pay

## 2018-03-02 MED ORDER — PRAVASTATIN SODIUM 20 MG PO TABS: 20.0000 mg | ORAL_TABLET | Freq: Every evening | ORAL | 3 refills | Status: DC

## 2018-03-02 NOTE — Telephone Encounter (Signed)
Refilled to [facility]

## 2018-03-08 ENCOUNTER — Other Ambulatory Visit: Payer: Self-pay

## 2018-03-08 MED ORDER — PRAVASTATIN SODIUM 20 MG PO TABS: 20.0000 mg | ORAL_TABLET | Freq: Every evening | ORAL | 3 refills | Status: DC

## 2018-03-08 NOTE — Telephone Encounter (Signed)
Pravastatin 20 mg e-scribed to [facility]

## 2018-03-20 DIAGNOSIS — M4316 Spondylolisthesis, lumbar region: Secondary | ICD-10-CM | POA: Diagnosis not present

## 2018-04-02 IMAGING — DX DG CHEST 2V
2 series · 2 of 2 positions shown · non-contrast
Comparison: 09/14/2013.

CLINICAL DATA: Shortness of breath.  Hypertension.

EXAM:
CHEST  2 VIEW

[chest pa]
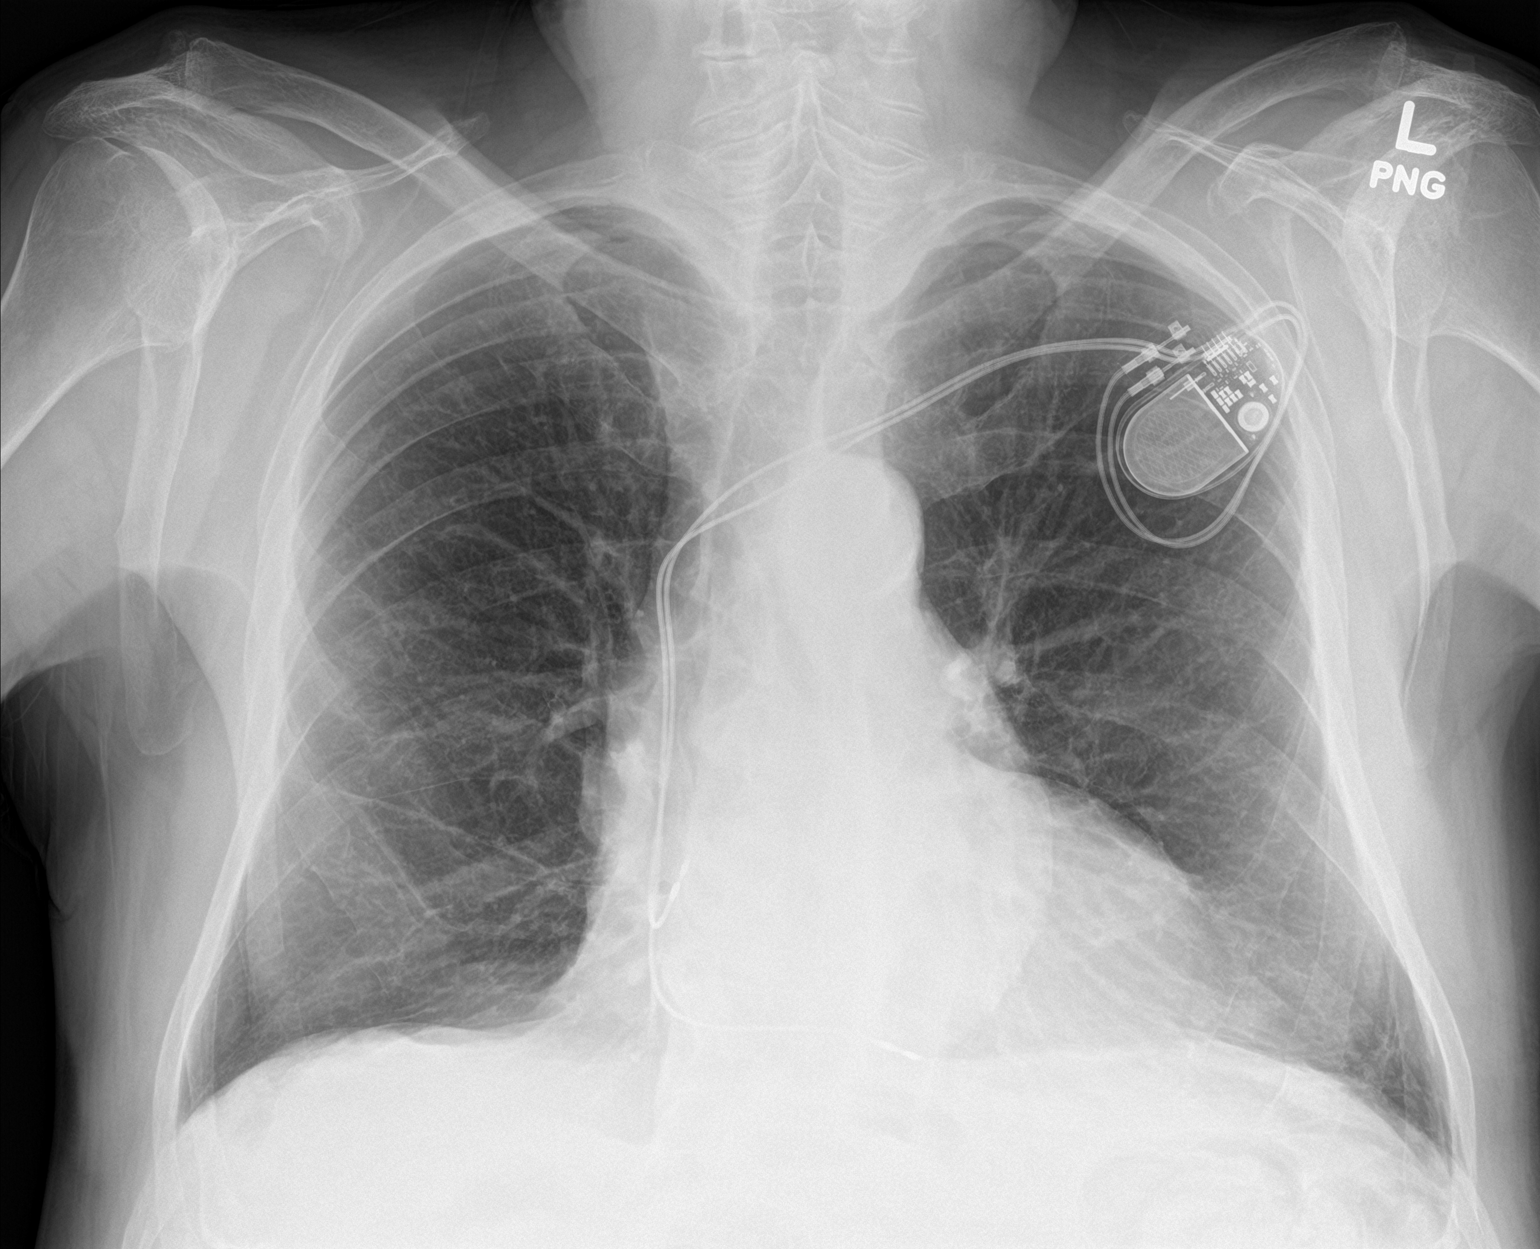

[chest lat]
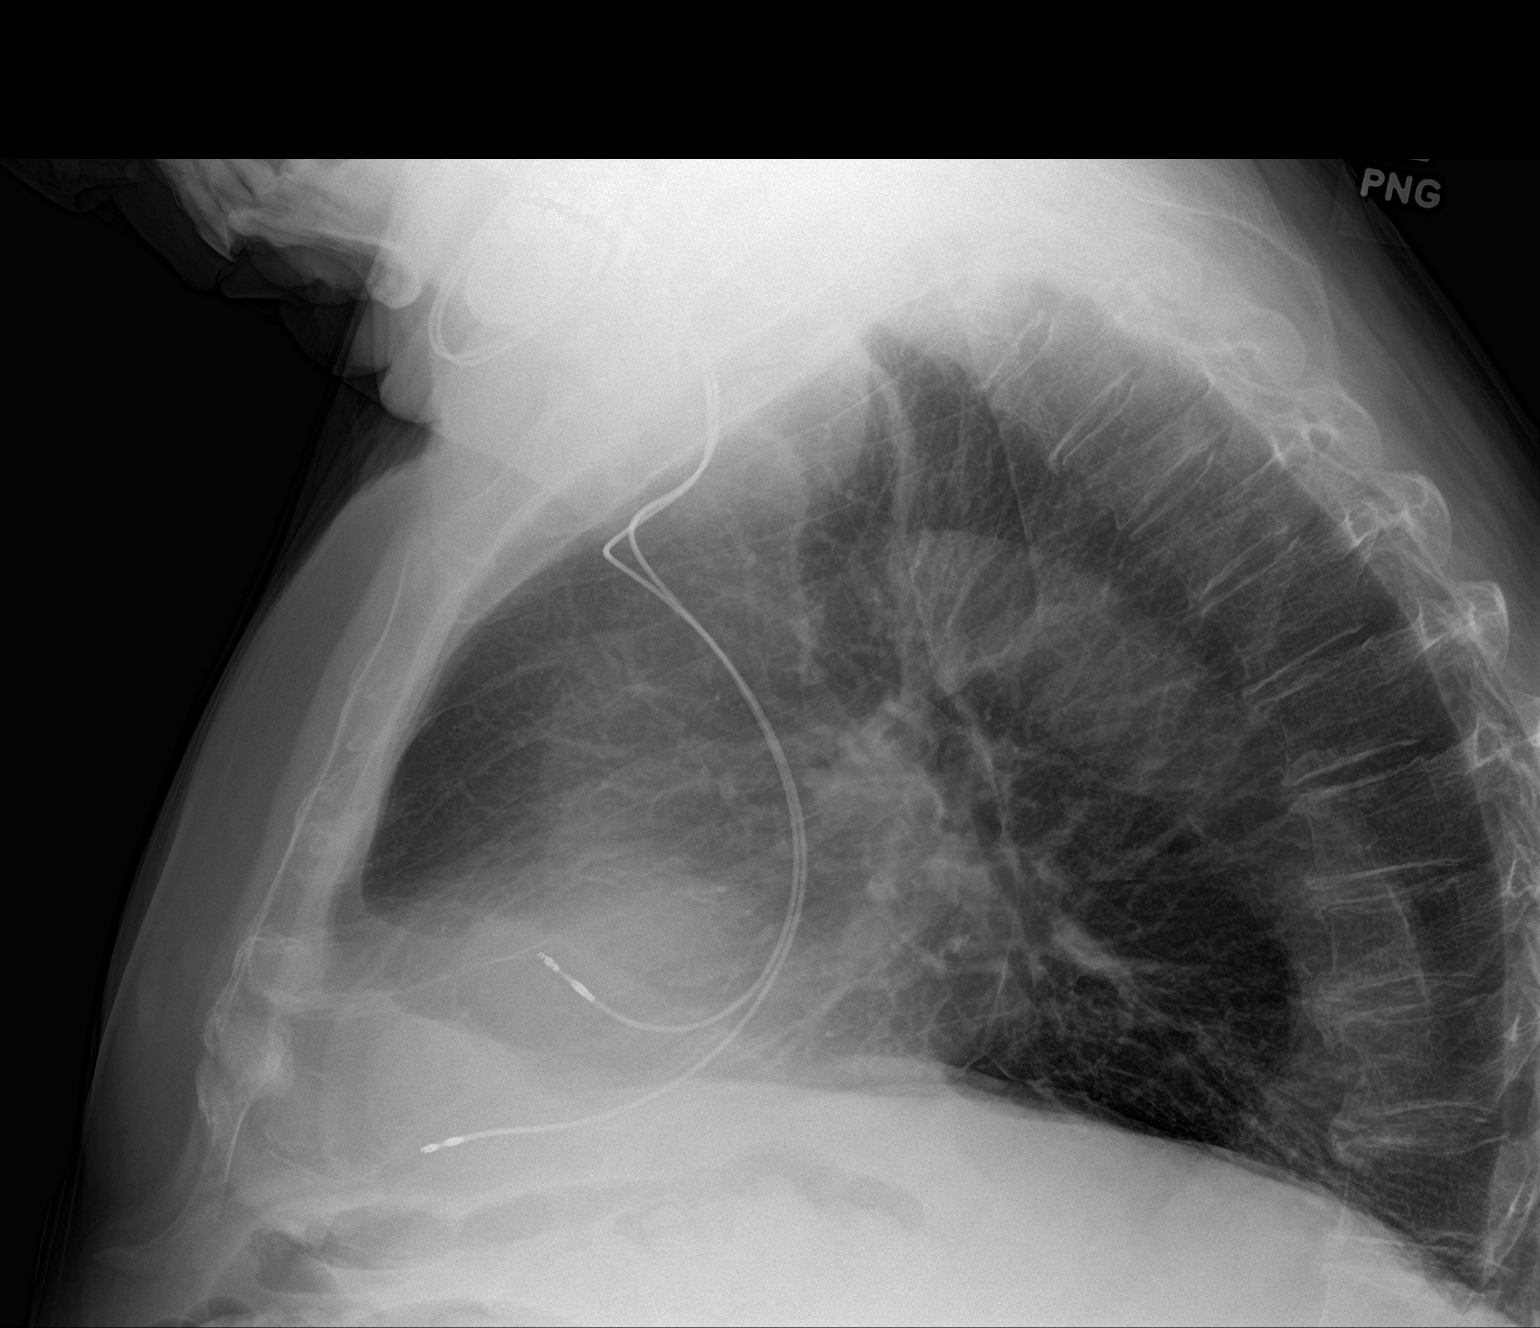

[2 of 2 positions shown; findings below may reference images not displayed]

FINDINGS: Cardiac pacer with lead tips in right atrium right ventricle.
Cardiomegaly. No pulmonary venous congestion. No focal pulmonary
infiltrate. Low lung volumes with mild bibasilar atelectasis and/or
scarring. No pleural effusion or pneumothorax .
IMPRESSION: 1. Cardiac pacer with lead tips in right atrium right ventricle.
Cardiomegaly with normal pulmonary vascularity.
2. Low lung volumes with mild bibasilar atelectasis and/or scarring.

## 2018-04-04 IMAGING — DX DG CHEST 2V
2 series · 2 of 2 positions shown · non-contrast
Comparison: 05/20/2016.

CLINICAL DATA: Chest pain today.

EXAM:
CHEST  2 VIEW

[chest pa]
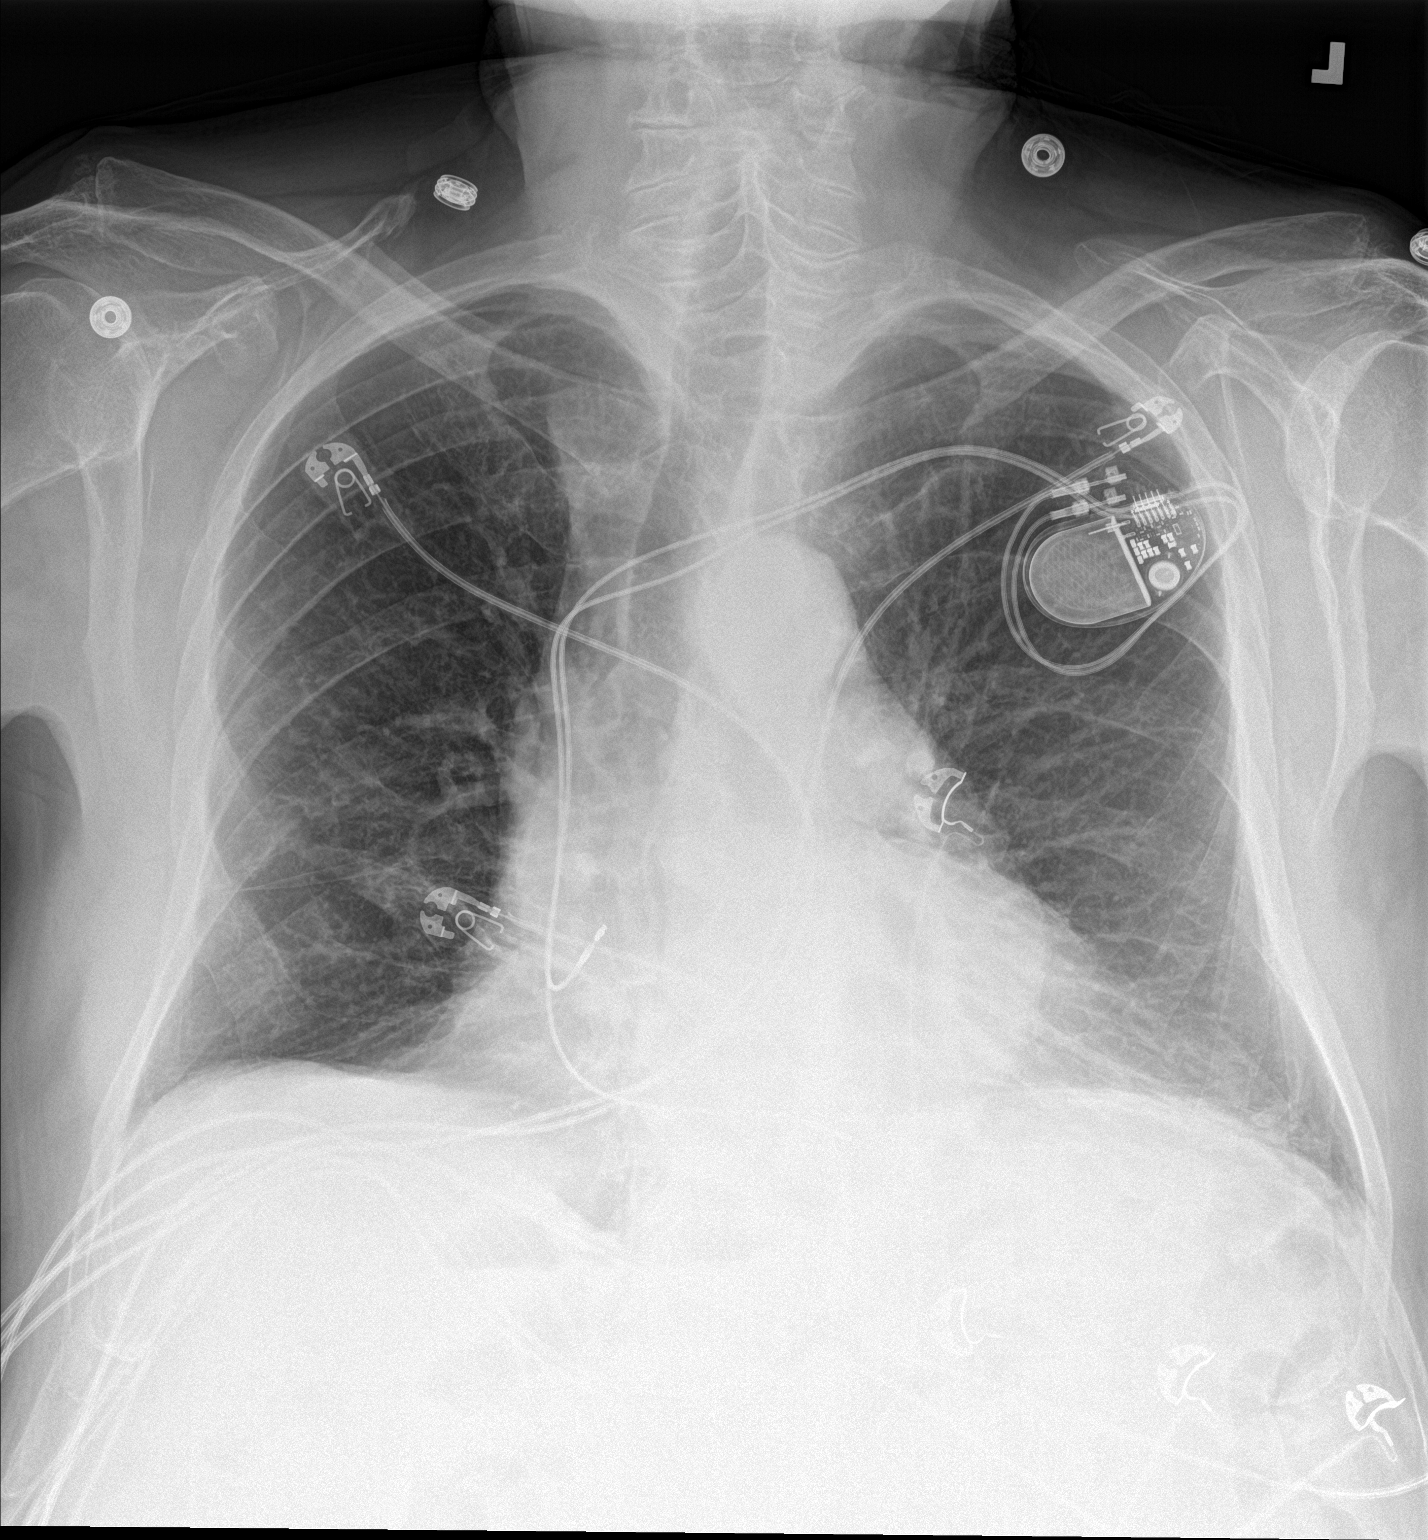

[chest lat]
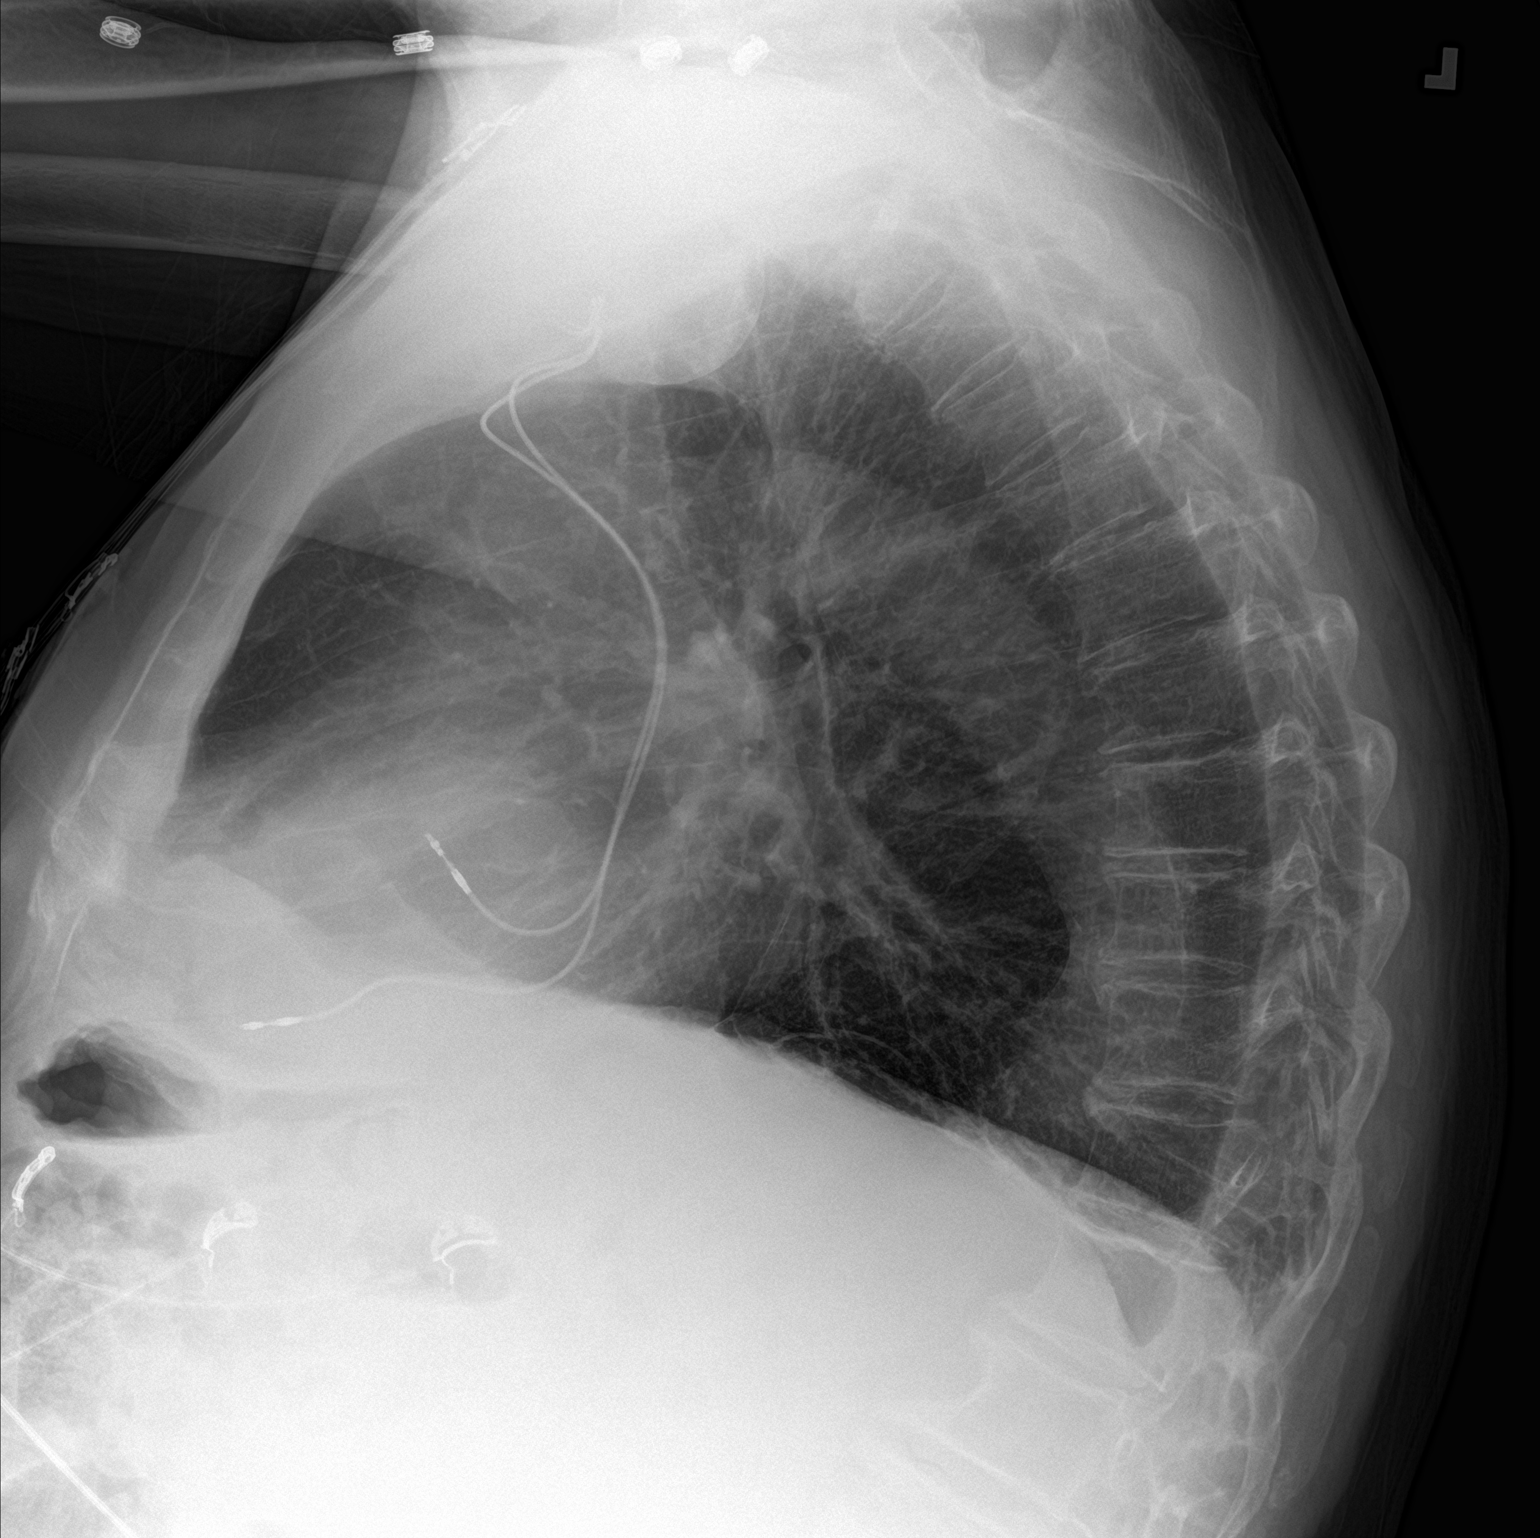

[2 of 2 positions shown; findings below may reference images not displayed]

FINDINGS: The cardiac silhouette remains mildly enlarged. The aorta remains
tortuous. Stable left subclavian pacemaker leads. Stable mild linear
scarring at the left lung base. Otherwise, clear lungs. The
hemidiaphragms remain flattened. Diffuse osteopenia. Thoracic spine
degenerative changes.
IMPRESSION: 1. No acute abnormality.
2. Stable mild cardiomegaly and mild changes of COPD.

## 2018-04-11 ENCOUNTER — Encounter (INDEPENDENT_AMBULATORY_CARE_PROVIDER_SITE_OTHER): Payer: Medicare Other | Admitting: Ophthalmology

## 2018-04-11 DIAGNOSIS — I10 Essential (primary) hypertension: Secondary | ICD-10-CM

## 2018-04-11 DIAGNOSIS — H353132 Nonexudative age-related macular degeneration, bilateral, intermediate dry stage: Secondary | ICD-10-CM

## 2018-04-11 DIAGNOSIS — H34832 Tributary (branch) retinal vein occlusion, left eye, with macular edema: Secondary | ICD-10-CM | POA: Diagnosis not present

## 2018-04-11 DIAGNOSIS — H43813 Vitreous degeneration, bilateral: Secondary | ICD-10-CM | POA: Diagnosis not present

## 2018-04-11 DIAGNOSIS — H35033 Hypertensive retinopathy, bilateral: Secondary | ICD-10-CM

## 2018-04-16 ENCOUNTER — Ambulatory Visit (INDEPENDENT_AMBULATORY_CARE_PROVIDER_SITE_OTHER): Payer: Medicare Other | Admitting: *Deleted

## 2018-04-16 DIAGNOSIS — I495 Sick sinus syndrome: Secondary | ICD-10-CM

## 2018-04-16 NOTE — Progress Notes (Signed)
Remote pacemaker transmission.   

## 2018-04-17 ENCOUNTER — Encounter: Payer: Self-pay | Admitting: Cardiology

## 2018-04-17 LAB — CUP PACEART REMOTE DEVICE CHECK
Date Time Interrogation Session: 20190423163620
Implantable Lead Implant Date: 20140919
MDC IDC LEAD IMPLANT DT: 20140919
MDC IDC LEAD LOCATION: 753859
MDC IDC LEAD LOCATION: 753860
MDC IDC PG IMPLANT DT: 20140919
Pulse Gen Model: 2240
Pulse Gen Serial Number: 7536055

## 2018-04-24 ENCOUNTER — Other Ambulatory Visit: Payer: Self-pay | Admitting: Cardiology

## 2018-04-24 DIAGNOSIS — E876 Hypokalemia: Secondary | ICD-10-CM

## 2018-04-24 MED ORDER — POTASSIUM CHLORIDE CRYS ER 20 MEQ PO TBCR: 20.0000 meq | EXTENDED_RELEASE_TABLET | Freq: Every day | ORAL | 3 refills | Status: DC

## 2018-04-24 NOTE — Telephone Encounter (Signed)
Pt's wife lvm stating he's needing his potassium chloride SA (K-DUR,KLOR-CON) 20 MEQ tablet [[id number]]  sent to [facility] order

## 2018-04-24 NOTE — Telephone Encounter (Signed)
Refill faxed per request

## 2018-04-25 ENCOUNTER — Telehealth: Payer: Self-pay | Admitting: *Deleted

## 2018-04-25 NOTE — Telephone Encounter (Signed)
Spoke to patient about persistent AF since 03/20/18. Patient denies any increased ShOB, LE edema, fatigue, or palps. Patient states that he's got chronic DOE. I encouraged patient to continue taking Eliquis 5BID and to call if anything changes. Patient verbalized understanding.

## 2018-04-25 NOTE — Telephone Encounter (Signed)
[email or web address]  Persistent AF since 03/20/18 - ?sx's.

## 2018-05-17 ENCOUNTER — Other Ambulatory Visit: Payer: Self-pay | Admitting: Internal Medicine

## 2018-05-18 NOTE — Telephone Encounter (Signed)
Pt is a 84 yr old male. Who last saw Dr Harl Bowie on 02/14/18, weight at that time was 92.1Kg. Serum creatine on 02/03/18 was 1.04. Will refill Eliquis 5mg  BID.

## 2018-05-28 ENCOUNTER — Other Ambulatory Visit: Payer: Self-pay | Admitting: Cardiology

## 2018-05-28 DIAGNOSIS — E876 Hypokalemia: Secondary | ICD-10-CM

## 2018-05-28 MED ORDER — POTASSIUM CHLORIDE CRYS ER 20 MEQ PO TBCR: 20.0000 meq | EXTENDED_RELEASE_TABLET | Freq: Every day | ORAL | 3 refills | Status: DC

## 2018-05-28 NOTE — Telephone Encounter (Signed)
Pt's wife called stating that the [facility] mail order pharmacy has not received the pt's refill Rx for his potassium chloride SA (K-DUR,KLOR-CON) 20 MEQ tablet [[id number]]  Please fax it to [phone]

## 2018-05-30 DIAGNOSIS — Z299 Encounter for prophylactic measures, unspecified: Secondary | ICD-10-CM | POA: Diagnosis not present

## 2018-05-30 DIAGNOSIS — I429 Cardiomyopathy, unspecified: Secondary | ICD-10-CM | POA: Diagnosis not present

## 2018-05-30 DIAGNOSIS — E7800 Pure hypercholesterolemia, unspecified: Secondary | ICD-10-CM | POA: Diagnosis not present

## 2018-05-30 DIAGNOSIS — I4891 Unspecified atrial fibrillation: Secondary | ICD-10-CM | POA: Diagnosis not present

## 2018-05-30 DIAGNOSIS — I10 Essential (primary) hypertension: Secondary | ICD-10-CM | POA: Diagnosis not present

## 2018-05-30 DIAGNOSIS — E1165 Type 2 diabetes mellitus with hyperglycemia: Secondary | ICD-10-CM | POA: Diagnosis not present

## 2018-05-30 DIAGNOSIS — Z6827 Body mass index (BMI) 27.0-27.9, adult: Secondary | ICD-10-CM | POA: Diagnosis not present

## 2018-05-30 DIAGNOSIS — I2510 Atherosclerotic heart disease of native coronary artery without angina pectoris: Secondary | ICD-10-CM | POA: Diagnosis not present

## 2018-05-30 DIAGNOSIS — N529 Male erectile dysfunction, unspecified: Secondary | ICD-10-CM | POA: Diagnosis not present

## 2018-06-06 ENCOUNTER — Other Ambulatory Visit: Payer: Self-pay | Admitting: Internal Medicine

## 2018-07-11 ENCOUNTER — Encounter (INDEPENDENT_AMBULATORY_CARE_PROVIDER_SITE_OTHER): Payer: Medicare Other | Admitting: Ophthalmology

## 2018-07-11 DIAGNOSIS — H353132 Nonexudative age-related macular degeneration, bilateral, intermediate dry stage: Secondary | ICD-10-CM | POA: Diagnosis not present

## 2018-07-11 DIAGNOSIS — H43813 Vitreous degeneration, bilateral: Secondary | ICD-10-CM | POA: Diagnosis not present

## 2018-07-11 DIAGNOSIS — I10 Essential (primary) hypertension: Secondary | ICD-10-CM | POA: Diagnosis not present

## 2018-07-11 DIAGNOSIS — H34832 Tributary (branch) retinal vein occlusion, left eye, with macular edema: Secondary | ICD-10-CM | POA: Diagnosis not present

## 2018-07-11 DIAGNOSIS — H35033 Hypertensive retinopathy, bilateral: Secondary | ICD-10-CM | POA: Diagnosis not present

## 2018-07-16 ENCOUNTER — Ambulatory Visit (INDEPENDENT_AMBULATORY_CARE_PROVIDER_SITE_OTHER): Payer: Medicare Other | Admitting: *Deleted

## 2018-07-16 DIAGNOSIS — I495 Sick sinus syndrome: Secondary | ICD-10-CM | POA: Diagnosis not present

## 2018-07-16 NOTE — Progress Notes (Signed)
Remote pacemaker transmission.   

## 2018-07-17 ENCOUNTER — Encounter: Payer: Self-pay | Admitting: Cardiology

## 2018-08-17 ENCOUNTER — Ambulatory Visit: Payer: Medicare Other | Admitting: Internal Medicine

## 2018-08-21 ENCOUNTER — Encounter: Payer: Self-pay | Admitting: Cardiology

## 2018-08-21 ENCOUNTER — Ambulatory Visit (INDEPENDENT_AMBULATORY_CARE_PROVIDER_SITE_OTHER): Payer: Medicare Other | Admitting: Cardiology

## 2018-08-21 VITALS — BP 158/84 | HR 73 | Ht 74.0 in | Wt 192.0 lb

## 2018-08-21 DIAGNOSIS — I2510 Atherosclerotic heart disease of native coronary artery without angina pectoris: Secondary | ICD-10-CM | POA: Diagnosis not present

## 2018-08-21 DIAGNOSIS — I519 Heart disease, unspecified: Secondary | ICD-10-CM | POA: Diagnosis not present

## 2018-08-21 DIAGNOSIS — I10 Essential (primary) hypertension: Secondary | ICD-10-CM

## 2018-08-21 DIAGNOSIS — I4891 Unspecified atrial fibrillation: Secondary | ICD-10-CM

## 2018-08-21 DIAGNOSIS — E782 Mixed hyperlipidemia: Secondary | ICD-10-CM

## 2018-08-21 NOTE — Progress Notes (Signed)
Clinical Summary Mr. Reyez is a 84 y.o.male seen today for follow up of the following medical problems.   1. Mild systolic dysfunction  - echo 07/2012 w/ LVEF 40-45%  - Cath 08/2013 w/ minimal CAD, LV gram 60% with normal filling pressures on RHC.     - occasional SOB that is stable. - some recent edema. Has not been interested in prn diuretic.   2. HTN   - accepting higher bp's due to prior dizziness   3. Bradycardia  -s/p [employer] dual chamber pacemaker  - followed by EP   4. Hyperlipidemia -compliant with statin - most recent labs by pcp  5. Afib  - tolerating eliquis. No recent palpitations   SH: wife on chemo and radiation along with recent hysterectomy, endometrial cancer. Just completed treatments, told she is cancer free.    Past Medical History:  Diagnosis Date  . Allergic rhinitis   . Anemia   . Anxiety   . Arthritis    "all over my body" (09/10/2013)  . BPH (benign prostatic hypertrophy)   . CAD (coronary artery disease)    a. 08/2013 LM nl, LAD 26m, D1 80ost, 95p, LCX 71m, RCA 20d, EF 60%.  . Chronic lower back pain   . Depression   . Diabetes mellitus    "diet controlled" (09/10/2013)  . Diaphragmatic hernia without mention of obstruction or gangrene   . Diastolic congestive heart failure ([place])   . Elevated liver function tests   . Essential hypertension, benign   . Lacunar infarction    a. 08/2013 post-cath, MRI: sm acute lacunar infarcts in LPCA and LSCA, ? tiny RPICA lacunar infarct.  . Obesity   . OSA on CPAP    "wears mask part of the time" (09/10/2013)  . Osteoporosis   . Sinus node dysfunction ([place])    a. 08/2013 s/p SJM DC PPM.     Allergies  Allergen Reactions  . Fentanyl Other (See Comments)    resp suppression per wife, out for 2 days      Current Outpatient Medications  Medication Sig Dispense Refill  . acetaminophen (TYLENOL) 500 MG tablet Take 500 mg by mouth every 6 (six) hours as needed for pain.    Marland Kitchen  alendronate (FOSAMAX) 70 MG tablet Take 70 mg by mouth every 7 (seven) days. Take with a full glass of water on an empty stomach. On Mondays    . apixaban (ELIQUIS) 5 MG TABS tablet Take 1 tablet (5 mg total) by mouth 2 (two) times daily. 180 tablet 3  . Calcium Carbonate-Vitamin D (CALCIUM 600 + D PO) Take 600 mg by mouth 2 (two) times daily.    . dorzolamide (TRUSOPT) 2 % ophthalmic solution Place 1 drop into the left eye 3 (three) times daily.     Marland Kitchen ELIQUIS 5 MG TABS tablet TAKE 1 TABLET BY MOUTH TWICE DAILY 60 tablet 9  . enalapril (VASOTEC) 20 MG tablet Take 1 tablet (20 mg total) by mouth 2 (two) times daily. 90 tablet 1  . fish oil-omega-3 fatty acids 1000 MG capsule Take 1 g by mouth 2 (two) times daily.      . hydrochlorothiazide (HYDRODIURIL) 25 MG tablet Take 1 tablet (25 mg total) by mouth daily. 90 tablet 3  . isosorbide mononitrate (IMDUR) 30 MG 24 hr tablet Take 30 mg by mouth daily.    . Multiple Vitamin (MULTIVITAMIN WITH MINERALS) TABS tablet Take 1 tablet by mouth daily.    . nitroGLYCERIN (NITROSTAT)  0.4 MG SL tablet Place 1 tablet (0.4 mg total) under the tongue every 5 (five) minutes x 3 doses as needed for chest pain. 25 tablet 3  . oxybutynin (DITROPAN) 5 MG tablet Take 5 mg by mouth daily.    . potassium chloride SA (K-DUR,KLOR-CON) 20 MEQ tablet Take 1 tablet (20 mEq total) by mouth daily. 90 tablet 3  . pravastatin (PRAVACHOL) 20 MG tablet Take 1 tablet (20 mg total) by mouth every evening. 90 tablet 3  . tamsulosin (FLOMAX) 0.4 MG CAPS capsule Take 0.4 mg by mouth daily.     . traMADol (ULTRAM) 50 MG tablet Take 50 mg by mouth every 6 (six) hours as needed.      No current facility-administered medications for this visit.      Past Surgical History:  Procedure Laterality Date  . CARDIAC CATHETERIZATION  1998  . CATARACT EXTRACTION W/ INTRAOCULAR LENS  IMPLANT, BILATERAL  2010  . LEFT AND RIGHT HEART CATHETERIZATION WITH CORONARY ANGIOGRAM N/A 09/11/2013    Procedure: LEFT AND RIGHT HEART CATHETERIZATION WITH CORONARY ANGIOGRAM;  Surgeon: Wellington Hampshire, MD;  Location: [facility] CATH LAB;  Service: Cardiovascular;  Laterality: N/A;  . PERMANENT PACEMAKER INSERTION N/A 09/13/2013   Procedure: PERMANENT PACEMAKER INSERTION;  Surgeon: Evans Lance, MD;  Location: [facility] CATH LAB;  Service: Cardiovascular;  Laterality: N/A;  . right hip fx  05/26/2016  . right wrist fx  05/26/2016  . STOMACH SURGERY  03/02/2012   ?volvus; "stomach came loose and twisted; went up under rib cage; had that corrected; tacked it up" (09/10/2013)     Allergies  Allergen Reactions  . Fentanyl Other (See Comments)    resp suppression per wife, out for 2 days       Family History  Problem Relation Age of Onset  . Hypertension Father   . Hypertension Mother      Social History Mr. Felter reports that he has never smoked. He has never used smokeless tobacco. Mr. Flanery reports that he does not drink alcohol.   Review of Systems CONSTITUTIONAL: No weight loss, fever, chills, weakness or fatigue.  HEENT: Eyes: No visual loss, blurred vision, double vision or yellow sclerae.No hearing loss, sneezing, congestion, runny nose or sore throat.  SKIN: No rash or itching.  CARDIOVASCULAR: per hpi RESPIRATORY: No shortness of breath, cough or sputum.  GASTROINTESTINAL: No anorexia, nausea, vomiting or diarrhea. No abdominal pain or blood.  GENITOURINARY: No burning on urination, no polyuria NEUROLOGICAL: rothsotatic dizzinessl.  MUSCULOSKELETAL: No muscle, back pain, joint pain or stiffness.  LYMPHATICS: No enlarged nodes. No history of splenectomy.  PSYCHIATRIC: No history of depression or anxiety.  ENDOCRINOLOGIC: No reports of sweating, cold or heat intolerance. No polyuria or polydipsia.  Marland Kitchen   Physical Examination Vitals:   08/21/18 0916  BP: (!) 158/84  Pulse: 73  SpO2: 96%   Vitals:   08/21/18 0916  Weight: 192 lb (87.1 kg)  Height: 6\' 2"  (1.88 m)    Gen:  resting comfortably, no acute distress HEENT: no scleral icterus, pupils equal round and reactive, no palptable cervical adenopathy,  CV: irreg, no m/r/g, no jvd Resp: Clear to auscultation bilaterally GI: abdomen is soft, non-tender, non-distended, normal bowel sounds, no hepatosplenomegaly MSK: extremities are warm, trace bilateral edema Skin: warm, no rash Neuro:  no focal deficits Psych: appropriate affect   Diagnostic Studies Cardiac Catheterization 9.17.2014 Procedural Findings:  Hemodynamics RA 3 mmHg  RV 37/1 mmHg  PA 33/12 mmHg  PCWP 6 mmHg  LV 201/4 mHg . LVEDP: 12 mmHg  AO 202/94 mmHg  Oxygen saturations:  PA 74%  AO 94%  Cardiac Output (Fick) 6.72  Cardiac Index (Fick) 3.22  Pulmonary vascular resistance (PVR): 1.9 [place] units.  Coronary angiography: Coronary dominance: right   Left Main: Normal in size with no significant disease.   Left Anterior Descending (LAD): Large in size with mild diffuse atherosclerosis. There is diffuse 20% disease in the midsegment.   1st diagonal (D1): Normal in size with 80% ostial stenosis followed by a 95% proximal stenosis.   2nd diagonal (D2): Medium in size with minor irregularities.   3rd diagonal (D3): Normal in size with no significant disease.   Circumflex (LCx): Normal in size and nondominant. There is 20% mid stenosis.   1st obtuse marginal: Medium in size with minor irregularities.   2nd obtuse marginal: Large is in size with no significant disease.   3rd obtuse marginal: Normal in size with minor irregularities.   Right Coronary Artery: large in size and dominant. There is 20% stenosis distally.   Posterior descending artery: normal in size with no significant disease.   Posterior AV segment: normal in size with no significant disease.   Posterolateral branchs: PL 1 is large with no significant disease. PL 2 is small. Left ventriculography: Left ventricular systolic function  is normal , LVEF is estimated at 60 %, there is no significant mitral regurgitation   07/2012 Echo: mild LVH, LVEF 40-45%, LAE, mild MR,     Assessment and Plan  1. Systolic dysfunction  - appears to have normalized based on LV gram from cath  -mild edema at times,he is not interested in prn diuretic. Continue to monitor at this time.   2. CAD  - patent major arteries with evidence of disease in diags and OMs fromprior cath -no recent symptoms, continue current meds  3. HTN  - accepting high bp's for him at this time due to prior dizziness, he also is resistant to additional meds  4. Bradycardia  - continueto follow in device clinic for his pacemaker  5. Hyperlipidemia - continue statin, request labs from pcp  6. Afib CHADS2Vasc score is 5, continue anticoag - no symptoms, continue to monitor at this time.  - EKG today shows rate controlled afib  F/u 6 months      Arnoldo Lenis, M.D.,

## 2018-08-21 NOTE — Patient Instructions (Signed)

## 2018-08-22 LAB — CUP PACEART REMOTE DEVICE CHECK
Implantable Lead Implant Date: 20140919
Implantable Lead Location: 753859
MDC IDC LEAD IMPLANT DT: 20140919
MDC IDC LEAD LOCATION: 753860
MDC IDC PG IMPLANT DT: 20140919
MDC IDC SESS DTM: 20190828105030
Pulse Gen Serial Number: 7536055

## 2018-09-05 DIAGNOSIS — I10 Essential (primary) hypertension: Secondary | ICD-10-CM | POA: Diagnosis not present

## 2018-09-05 DIAGNOSIS — Z299 Encounter for prophylactic measures, unspecified: Secondary | ICD-10-CM | POA: Diagnosis not present

## 2018-09-05 DIAGNOSIS — E1165 Type 2 diabetes mellitus with hyperglycemia: Secondary | ICD-10-CM | POA: Diagnosis not present

## 2018-09-05 DIAGNOSIS — I429 Cardiomyopathy, unspecified: Secondary | ICD-10-CM | POA: Diagnosis not present

## 2018-09-05 DIAGNOSIS — Z6827 Body mass index (BMI) 27.0-27.9, adult: Secondary | ICD-10-CM | POA: Diagnosis not present

## 2018-09-05 DIAGNOSIS — I4891 Unspecified atrial fibrillation: Secondary | ICD-10-CM | POA: Diagnosis not present

## 2018-09-11 DIAGNOSIS — M47816 Spondylosis without myelopathy or radiculopathy, lumbar region: Secondary | ICD-10-CM | POA: Diagnosis not present

## 2018-09-11 DIAGNOSIS — M4316 Spondylolisthesis, lumbar region: Secondary | ICD-10-CM | POA: Diagnosis not present

## 2018-10-01 DIAGNOSIS — R1030 Lower abdominal pain, unspecified: Secondary | ICD-10-CM | POA: Diagnosis not present

## 2018-10-01 DIAGNOSIS — E119 Type 2 diabetes mellitus without complications: Secondary | ICD-10-CM | POA: Diagnosis not present

## 2018-10-01 DIAGNOSIS — I10 Essential (primary) hypertension: Secondary | ICD-10-CM | POA: Diagnosis not present

## 2018-10-01 DIAGNOSIS — R1032 Left lower quadrant pain: Secondary | ICD-10-CM | POA: Diagnosis not present

## 2018-10-01 DIAGNOSIS — E876 Hypokalemia: Secondary | ICD-10-CM | POA: Diagnosis not present

## 2018-10-01 DIAGNOSIS — J479 Bronchiectasis, uncomplicated: Secondary | ICD-10-CM | POA: Diagnosis not present

## 2018-10-01 DIAGNOSIS — C642 Malignant neoplasm of left kidney, except renal pelvis: Secondary | ICD-10-CM | POA: Diagnosis not present

## 2018-10-01 DIAGNOSIS — R1012 Left upper quadrant pain: Secondary | ICD-10-CM | POA: Diagnosis not present

## 2018-10-01 DIAGNOSIS — R339 Retention of urine, unspecified: Secondary | ICD-10-CM | POA: Diagnosis not present

## 2018-10-01 DIAGNOSIS — R109 Unspecified abdominal pain: Secondary | ICD-10-CM | POA: Diagnosis not present

## 2018-10-01 DIAGNOSIS — R0902 Hypoxemia: Secondary | ICD-10-CM | POA: Diagnosis not present

## 2018-10-01 DIAGNOSIS — N200 Calculus of kidney: Secondary | ICD-10-CM | POA: Diagnosis present

## 2018-10-01 DIAGNOSIS — K5900 Constipation, unspecified: Secondary | ICD-10-CM | POA: Diagnosis present

## 2018-10-01 DIAGNOSIS — N2889 Other specified disorders of kidney and ureter: Secondary | ICD-10-CM | POA: Diagnosis not present

## 2018-10-01 DIAGNOSIS — I4891 Unspecified atrial fibrillation: Secondary | ICD-10-CM | POA: Diagnosis not present

## 2018-10-01 DIAGNOSIS — Z79899 Other long term (current) drug therapy: Secondary | ICD-10-CM | POA: Diagnosis not present

## 2018-10-01 DIAGNOSIS — Z7901 Long term (current) use of anticoagulants: Secondary | ICD-10-CM | POA: Diagnosis not present

## 2018-10-04 DIAGNOSIS — R338 Other retention of urine: Secondary | ICD-10-CM | POA: Diagnosis present

## 2018-10-04 DIAGNOSIS — N401 Enlarged prostate with lower urinary tract symptoms: Secondary | ICD-10-CM | POA: Diagnosis not present

## 2018-10-04 DIAGNOSIS — N2889 Other specified disorders of kidney and ureter: Secondary | ICD-10-CM | POA: Diagnosis not present

## 2018-10-04 DIAGNOSIS — I10 Essential (primary) hypertension: Secondary | ICD-10-CM | POA: Diagnosis present

## 2018-10-04 DIAGNOSIS — E119 Type 2 diabetes mellitus without complications: Secondary | ICD-10-CM | POA: Diagnosis not present

## 2018-10-04 DIAGNOSIS — R339 Retention of urine, unspecified: Secondary | ICD-10-CM | POA: Diagnosis not present

## 2018-10-04 DIAGNOSIS — K5641 Fecal impaction: Secondary | ICD-10-CM | POA: Diagnosis not present

## 2018-10-04 DIAGNOSIS — Z950 Presence of cardiac pacemaker: Secondary | ICD-10-CM | POA: Diagnosis not present

## 2018-10-04 DIAGNOSIS — K5900 Constipation, unspecified: Secondary | ICD-10-CM | POA: Diagnosis not present

## 2018-10-11 DIAGNOSIS — M810 Age-related osteoporosis without current pathological fracture: Secondary | ICD-10-CM | POA: Diagnosis not present

## 2018-10-11 DIAGNOSIS — Z466 Encounter for fitting and adjustment of urinary device: Secondary | ICD-10-CM | POA: Diagnosis not present

## 2018-10-11 DIAGNOSIS — R338 Other retention of urine: Secondary | ICD-10-CM | POA: Diagnosis not present

## 2018-10-11 DIAGNOSIS — N2889 Other specified disorders of kidney and ureter: Secondary | ICD-10-CM | POA: Diagnosis not present

## 2018-10-11 DIAGNOSIS — M1990 Unspecified osteoarthritis, unspecified site: Secondary | ICD-10-CM | POA: Diagnosis not present

## 2018-10-11 DIAGNOSIS — N401 Enlarged prostate with lower urinary tract symptoms: Secondary | ICD-10-CM | POA: Diagnosis not present

## 2018-10-11 DIAGNOSIS — Z7901 Long term (current) use of anticoagulants: Secondary | ICD-10-CM | POA: Diagnosis not present

## 2018-10-11 DIAGNOSIS — M5137 Other intervertebral disc degeneration, lumbosacral region: Secondary | ICD-10-CM | POA: Diagnosis not present

## 2018-10-11 DIAGNOSIS — Z7984 Long term (current) use of oral hypoglycemic drugs: Secondary | ICD-10-CM | POA: Diagnosis not present

## 2018-10-11 DIAGNOSIS — I2510 Atherosclerotic heart disease of native coronary artery without angina pectoris: Secondary | ICD-10-CM | POA: Diagnosis not present

## 2018-10-11 DIAGNOSIS — Z950 Presence of cardiac pacemaker: Secondary | ICD-10-CM | POA: Diagnosis not present

## 2018-10-11 DIAGNOSIS — I10 Essential (primary) hypertension: Secondary | ICD-10-CM | POA: Diagnosis not present

## 2018-10-11 DIAGNOSIS — E119 Type 2 diabetes mellitus without complications: Secondary | ICD-10-CM | POA: Diagnosis not present

## 2018-10-11 DIAGNOSIS — H9190 Unspecified hearing loss, unspecified ear: Secondary | ICD-10-CM | POA: Diagnosis not present

## 2018-10-11 DIAGNOSIS — Z8781 Personal history of (healed) traumatic fracture: Secondary | ICD-10-CM | POA: Diagnosis not present

## 2018-10-11 DIAGNOSIS — G4730 Sleep apnea, unspecified: Secondary | ICD-10-CM | POA: Diagnosis not present

## 2018-10-15 ENCOUNTER — Ambulatory Visit (INDEPENDENT_AMBULATORY_CARE_PROVIDER_SITE_OTHER): Payer: Medicare Other | Admitting: *Deleted

## 2018-10-15 DIAGNOSIS — R338 Other retention of urine: Secondary | ICD-10-CM | POA: Diagnosis not present

## 2018-10-15 DIAGNOSIS — N401 Enlarged prostate with lower urinary tract symptoms: Secondary | ICD-10-CM | POA: Diagnosis not present

## 2018-10-15 DIAGNOSIS — Z299 Encounter for prophylactic measures, unspecified: Secondary | ICD-10-CM | POA: Diagnosis not present

## 2018-10-15 DIAGNOSIS — Z09 Encounter for follow-up examination after completed treatment for conditions other than malignant neoplasm: Secondary | ICD-10-CM | POA: Diagnosis not present

## 2018-10-15 DIAGNOSIS — Z6827 Body mass index (BMI) 27.0-27.9, adult: Secondary | ICD-10-CM | POA: Diagnosis not present

## 2018-10-15 DIAGNOSIS — I495 Sick sinus syndrome: Secondary | ICD-10-CM

## 2018-10-15 DIAGNOSIS — I10 Essential (primary) hypertension: Secondary | ICD-10-CM | POA: Diagnosis not present

## 2018-10-15 DIAGNOSIS — C642 Malignant neoplasm of left kidney, except renal pelvis: Secondary | ICD-10-CM | POA: Diagnosis not present

## 2018-10-15 NOTE — Progress Notes (Signed)
Remote pacemaker transmission.   

## 2018-10-16 ENCOUNTER — Encounter: Payer: Self-pay | Admitting: Cardiology

## 2018-10-16 DIAGNOSIS — E119 Type 2 diabetes mellitus without complications: Secondary | ICD-10-CM | POA: Diagnosis not present

## 2018-10-16 DIAGNOSIS — R338 Other retention of urine: Secondary | ICD-10-CM | POA: Diagnosis not present

## 2018-10-16 DIAGNOSIS — I2510 Atherosclerotic heart disease of native coronary artery without angina pectoris: Secondary | ICD-10-CM | POA: Diagnosis not present

## 2018-10-16 DIAGNOSIS — I10 Essential (primary) hypertension: Secondary | ICD-10-CM | POA: Diagnosis not present

## 2018-10-16 DIAGNOSIS — M5137 Other intervertebral disc degeneration, lumbosacral region: Secondary | ICD-10-CM | POA: Diagnosis not present

## 2018-10-16 DIAGNOSIS — N401 Enlarged prostate with lower urinary tract symptoms: Secondary | ICD-10-CM | POA: Diagnosis not present

## 2018-10-17 DIAGNOSIS — I2510 Atherosclerotic heart disease of native coronary artery without angina pectoris: Secondary | ICD-10-CM | POA: Diagnosis not present

## 2018-10-17 DIAGNOSIS — E119 Type 2 diabetes mellitus without complications: Secondary | ICD-10-CM | POA: Diagnosis not present

## 2018-10-17 DIAGNOSIS — I10 Essential (primary) hypertension: Secondary | ICD-10-CM | POA: Diagnosis not present

## 2018-10-17 DIAGNOSIS — R338 Other retention of urine: Secondary | ICD-10-CM | POA: Diagnosis not present

## 2018-10-17 DIAGNOSIS — N401 Enlarged prostate with lower urinary tract symptoms: Secondary | ICD-10-CM | POA: Diagnosis not present

## 2018-10-17 DIAGNOSIS — M5137 Other intervertebral disc degeneration, lumbosacral region: Secondary | ICD-10-CM | POA: Diagnosis not present

## 2018-10-23 ENCOUNTER — Ambulatory Visit (INDEPENDENT_AMBULATORY_CARE_PROVIDER_SITE_OTHER): Payer: Medicare Other | Admitting: Internal Medicine

## 2018-10-23 ENCOUNTER — Encounter: Payer: Self-pay | Admitting: Internal Medicine

## 2018-10-23 DIAGNOSIS — R609 Edema, unspecified: Secondary | ICD-10-CM

## 2018-10-23 DIAGNOSIS — I2510 Atherosclerotic heart disease of native coronary artery without angina pectoris: Secondary | ICD-10-CM

## 2018-10-23 DIAGNOSIS — G4733 Obstructive sleep apnea (adult) (pediatric): Secondary | ICD-10-CM | POA: Diagnosis not present

## 2018-10-23 NOTE — Assessment & Plan Note (Signed)
He is 84 years old and has multiple health problems.  Treatment for sleep apnea may not be improving his quality of life.  I suggested he consider an oral appliance from the drugstore, or chinstrap, but told him it would be okay if he decided not to do anything.

## 2018-10-23 NOTE — Patient Instructions (Signed)
We talked about the possibility of trying either a chin strap or a simple mouth piece for snoring from the drug store  Please call we can help

## 2018-10-23 NOTE — Assessment & Plan Note (Signed)
Significant edema to knees is evident today.  He is followed by his PCP and cardiology.

## 2018-10-23 NOTE — Progress Notes (Signed)
HPI male never smoker followed for Allergic rhinitis, history OSA/quit CPAP, dyspnea, complicated by DM, iron deficiency anemia, HBP, CHF/pacemaker, degenerative disc disease ONOX 12/26/2012- did not qualify for O2 with sleep.  Office Spirometry 05/20/2016-moderate restriction of exhaled volume-FVC 2.63/57%, FEV1 2.11/62%, FEV1/FVC 0.8 Walk test on room air 05/20/2016-oxygen saturation 98%, 96%, 91%, 95% after 185 feet 3. No significant oxygen desaturation with this exercise. Unattended Home Sleep Test 02/01/2017- AHI 15.4/hour, desaturation to 86%, body weight 205 pounds -------------------------------------------------------------------------------------- 08/16/17- 84 year old male never smoker followed for OSA, allergic rhinitis, dyspnea, Treated by DM, iron deficiency anemia, HBP, CHF/pacemaker, glaucoma Pt states that he fell June 2017 broke his hip and rt arm and was in rehab. Pt was sent to a dentist to get an implant to move his lower jaw forward. Pt does have a CPAP machine but has not worn it in at least 2 years. Pt cannot sleep with the face mask due to not being able to sleep on his back. He says he is "fairly" comfortable with his oral appliance which does help him sleep and stops is snoring according to his wife. Complains of postnasal drip-watery. Gets flu shots at health Department. CXR 05/22/16 IMPRESSION: 1. No acute abnormality. 2. Stable mild cardiomegaly and mild changes of COPD.  10/23/18- 84 year old male never smoker followed for OSA, allergic rhinitis, dyspnea, Treated by DM, iron deficiency anemia, HBP, CHF/pacemaker, glaucoma -----OSA: Pt uses Oral appliance at times per wife; pt has hard time staying asleep unless he is sleeping in a certain recliner.  Multiple anxieties and discomforts related to BPH and a renal mass pending surgery at [place] flu shot at health department near his home. Breathing is okay with little routine cough or wheeze.  Weakness consistent with  age and morbidities leads to dyspnea on exertion but activity is limited. Oral appliance is uncomfortable-pulse too much and hard to get out of his mouth.  We discussed the possibility of getting a non-fitted simple appliance at the drugstore, or not doing anything at all for sleep apnea.  ROS-see HPI  + = positive Constitutional:   No-  acute weight loss, night sweats, fevers, chills,  +fatigue, lassitude. HEENT:   No-  headaches, difficulty swallowing, tooth/dental problems, sore throat,       No-  sneezing, itching, ear ache, nasal congestion, + post nasal drip,  CV:  No-   chest pain, orthopnea, PND, +swelling in lower extremities, No-anasarca,  dizziness, palpitations Resp:  No change in mild chronic shortness of breath with exertion or at rest.              No-   productive cough,  No non-productive cough,  No- coughing up of blood.              No-   change in color of mucus.  No- wheezing.   Skin: No-   rash or lesions. GI:  No-   heartburn, indigestion, abdominal pain, nausea, vomiting,  GU: +nocturia MS:  +   joint pain or swelling.  + decreased range of motion.  + back pain. Neuro-     nothing unusual Psych:  No- change in mood or affect. No depression or anxiety.  No memory loss.  OBJ General- Alert, Oriented, Affect-appropriate, Distress- none acute, overweight Skin- rash-none, lesions- none, excoriation- none Lymphadenopathy- none Head- atraumatic            Eyes- Gross vision intact, PERRLA, conjunctivae clear secretions  Ears- Hearing aid            Nose- Clear, no-Septal dev, mucus, polyps, erosion, perforation             Throat- Mallampati III-IV , mucosa clear , drainage- none, tonsils- atrophic. +Own teeth Neck- flexible , trachea midline, no stridor , thyroid nl, carotid no bruit Chest - symmetrical excursion , unlabored           Heart/CV- RRR , no murmur , no gallop  , no rub, nl s1 s2                           - JVD- none , edema- 3+, stasis changes-  none, varices- none           Lung- clear to P&A, wheeze- none, cough- none , dullness-none, rub- none. No rales heard           Chest wall- + pacemaker Abd-  Br/ Gen/ Rectal- Not done, not indicated Extrem- cyanosis- none, clubbing, none, atrophy- none, strength- nl, + cane,                          + urine drainage bag/ catheter Neuro- grossly intact to observation

## 2018-10-24 ENCOUNTER — Encounter (INDEPENDENT_AMBULATORY_CARE_PROVIDER_SITE_OTHER): Payer: Medicare Other | Admitting: Ophthalmology

## 2018-10-24 DIAGNOSIS — R338 Other retention of urine: Secondary | ICD-10-CM | POA: Diagnosis not present

## 2018-10-24 DIAGNOSIS — H43813 Vitreous degeneration, bilateral: Secondary | ICD-10-CM

## 2018-10-24 DIAGNOSIS — I10 Essential (primary) hypertension: Secondary | ICD-10-CM

## 2018-10-24 DIAGNOSIS — M5137 Other intervertebral disc degeneration, lumbosacral region: Secondary | ICD-10-CM | POA: Diagnosis not present

## 2018-10-24 DIAGNOSIS — H35033 Hypertensive retinopathy, bilateral: Secondary | ICD-10-CM | POA: Diagnosis not present

## 2018-10-24 DIAGNOSIS — H34832 Tributary (branch) retinal vein occlusion, left eye, with macular edema: Secondary | ICD-10-CM | POA: Diagnosis not present

## 2018-10-24 DIAGNOSIS — H353111 Nonexudative age-related macular degeneration, right eye, early dry stage: Secondary | ICD-10-CM

## 2018-10-24 DIAGNOSIS — I2510 Atherosclerotic heart disease of native coronary artery without angina pectoris: Secondary | ICD-10-CM | POA: Diagnosis not present

## 2018-10-24 DIAGNOSIS — N401 Enlarged prostate with lower urinary tract symptoms: Secondary | ICD-10-CM | POA: Diagnosis not present

## 2018-10-24 DIAGNOSIS — E119 Type 2 diabetes mellitus without complications: Secondary | ICD-10-CM | POA: Diagnosis not present

## 2018-10-25 DIAGNOSIS — I2510 Atherosclerotic heart disease of native coronary artery without angina pectoris: Secondary | ICD-10-CM | POA: Diagnosis not present

## 2018-10-25 DIAGNOSIS — E119 Type 2 diabetes mellitus without complications: Secondary | ICD-10-CM | POA: Diagnosis not present

## 2018-10-25 DIAGNOSIS — Z23 Encounter for immunization: Secondary | ICD-10-CM | POA: Diagnosis not present

## 2018-10-25 DIAGNOSIS — N401 Enlarged prostate with lower urinary tract symptoms: Secondary | ICD-10-CM | POA: Diagnosis not present

## 2018-10-25 DIAGNOSIS — R338 Other retention of urine: Secondary | ICD-10-CM | POA: Diagnosis not present

## 2018-10-25 DIAGNOSIS — I10 Essential (primary) hypertension: Secondary | ICD-10-CM | POA: Diagnosis not present

## 2018-10-25 DIAGNOSIS — M5137 Other intervertebral disc degeneration, lumbosacral region: Secondary | ICD-10-CM | POA: Diagnosis not present

## 2018-10-26 DIAGNOSIS — R338 Other retention of urine: Secondary | ICD-10-CM | POA: Diagnosis not present

## 2018-10-26 DIAGNOSIS — I2510 Atherosclerotic heart disease of native coronary artery without angina pectoris: Secondary | ICD-10-CM | POA: Diagnosis not present

## 2018-10-26 DIAGNOSIS — N401 Enlarged prostate with lower urinary tract symptoms: Secondary | ICD-10-CM | POA: Diagnosis not present

## 2018-10-26 DIAGNOSIS — I10 Essential (primary) hypertension: Secondary | ICD-10-CM | POA: Diagnosis not present

## 2018-10-26 DIAGNOSIS — M5137 Other intervertebral disc degeneration, lumbosacral region: Secondary | ICD-10-CM | POA: Diagnosis not present

## 2018-10-26 DIAGNOSIS — E119 Type 2 diabetes mellitus without complications: Secondary | ICD-10-CM | POA: Diagnosis not present

## 2018-10-29 DIAGNOSIS — E119 Type 2 diabetes mellitus without complications: Secondary | ICD-10-CM | POA: Diagnosis not present

## 2018-10-29 DIAGNOSIS — R338 Other retention of urine: Secondary | ICD-10-CM | POA: Diagnosis not present

## 2018-10-29 DIAGNOSIS — N401 Enlarged prostate with lower urinary tract symptoms: Secondary | ICD-10-CM | POA: Diagnosis not present

## 2018-10-29 DIAGNOSIS — I10 Essential (primary) hypertension: Secondary | ICD-10-CM | POA: Diagnosis not present

## 2018-10-29 DIAGNOSIS — M5137 Other intervertebral disc degeneration, lumbosacral region: Secondary | ICD-10-CM | POA: Diagnosis not present

## 2018-10-29 DIAGNOSIS — I2510 Atherosclerotic heart disease of native coronary artery without angina pectoris: Secondary | ICD-10-CM | POA: Diagnosis not present

## 2018-10-30 DIAGNOSIS — Z466 Encounter for fitting and adjustment of urinary device: Secondary | ICD-10-CM | POA: Diagnosis not present

## 2018-10-30 DIAGNOSIS — N2889 Other specified disorders of kidney and ureter: Secondary | ICD-10-CM | POA: Diagnosis not present

## 2018-10-30 DIAGNOSIS — N472 Paraphimosis: Secondary | ICD-10-CM | POA: Diagnosis not present

## 2018-10-30 DIAGNOSIS — K5900 Constipation, unspecified: Secondary | ICD-10-CM | POA: Diagnosis not present

## 2018-10-30 DIAGNOSIS — R339 Retention of urine, unspecified: Secondary | ICD-10-CM | POA: Diagnosis not present

## 2018-10-31 ENCOUNTER — Encounter: Payer: Self-pay | Admitting: Internal Medicine

## 2018-10-31 ENCOUNTER — Ambulatory Visit (INDEPENDENT_AMBULATORY_CARE_PROVIDER_SITE_OTHER): Payer: Medicare Other | Admitting: Internal Medicine

## 2018-10-31 VITALS — BP 144/80 | HR 72 | Ht 70.0 in | Wt 184.0 lb

## 2018-10-31 DIAGNOSIS — I495 Sick sinus syndrome: Secondary | ICD-10-CM

## 2018-10-31 DIAGNOSIS — I4819 Other persistent atrial fibrillation: Secondary | ICD-10-CM | POA: Diagnosis not present

## 2018-10-31 DIAGNOSIS — Z950 Presence of cardiac pacemaker: Secondary | ICD-10-CM | POA: Diagnosis not present

## 2018-10-31 DIAGNOSIS — I2510 Atherosclerotic heart disease of native coronary artery without angina pectoris: Secondary | ICD-10-CM

## 2018-10-31 LAB — CUP PACEART INCLINIC DEVICE CHECK
Battery Remaining Longevity: 102 mo
Implantable Lead Implant Date: 20140919
Implantable Lead Location: 753860
Lead Channel Pacing Threshold Amplitude: 0.75 V
Lead Channel Sensing Intrinsic Amplitude: 6.3 mV
Lead Channel Setting Pacing Amplitude: 2.5 V
Lead Channel Setting Sensing Sensitivity: 2 mV
MDC IDC LEAD IMPLANT DT: 20140919
MDC IDC LEAD LOCATION: 753859
MDC IDC MSMT BATTERY VOLTAGE: 2.95 V
MDC IDC MSMT LEADCHNL RA IMPEDANCE VALUE: 387.5 Ohm
MDC IDC MSMT LEADCHNL RA SENSING INTR AMPL: 0.7 mV
MDC IDC MSMT LEADCHNL RV IMPEDANCE VALUE: 437.5 Ohm
MDC IDC MSMT LEADCHNL RV PACING THRESHOLD PULSEWIDTH: 0.5 ms
MDC IDC PG IMPLANT DT: 20140919
MDC IDC PG SERIAL: 7536055
MDC IDC SESS DTM: 20191106120558
MDC IDC SET LEADCHNL RV PACING PULSEWIDTH: 0.5 ms
MDC IDC STAT BRADY RA PERCENT PACED: 25 %
MDC IDC STAT BRADY RV PERCENT PACED: 69 %

## 2018-10-31 NOTE — Patient Instructions (Signed)
Medication Instructions:  Your physician recommends that you continue on your current medications as directed. Please refer to the Current Medication list given to you today.  If you need a refill on your cardiac medications before your next appointment, please call your pharmacy.   Lab work: NONE  If you have labs (blood work) drawn today and your tests are completely normal, you will receive your results only by: Marland Kitchen MyChart Message (if you have MyChart) OR . A paper copy in the mail If you have any lab test that is abnormal or we need to change your treatment, we will call you to review the results.  Testing/Procedures: NONE   Follow-Up: At [facility], you and your health needs are our priority.  As part of our continuing mission to provide you with exceptional heart care, we have created designated Provider Care Teams.  These Care Teams include your primary Cardiologist (physician) and Advanced Practice Providers (APPs -  Physician Assistants and Nurse Practitioners) who all work together to provide you with the care you need, when you need it. You will need a follow up appointment in 1 years.  Please call our office 2 months in advance to schedule this appointment.  You may see Crissie Sickles, MD or one of the following Advanced Practice Providers on your designated Care Team:   Bernerd Pho, PA-C [facility]) . Ermalinda Barrios, PA-C ([place])  Any Other Special Instructions Will Be Listed Below (If Applicable). Thank you for choosing [facility]!

## 2018-10-31 NOTE — Progress Notes (Signed)
HPI [name] returns today for follow-up of his atrial fibrillation and symptomatic bradycardia. He is an 84 year old man with a history of paroxysmal atrial fibrillation,symptomatic bradycardia, status post pacemaker insertion, hypertension, and chronic peripheral edema. He has been sedentary in the past secondary to chronic back pain.his blood pressure has not been well-controlled. He has been in atrial fib for several months but did not know it. He has been on Eliquis. His rates are well controlled. Allergies  Allergen Reactions  . Fentanyl Other (See Comments)    resp suppression per wife, out for 2 days      Current Outpatient Medications  Medication Sig Dispense Refill  . acetaminophen (TYLENOL) 500 MG tablet Take 500 mg by mouth every 6 (six) hours as needed for pain.    Marland Kitchen alendronate (FOSAMAX) 70 MG tablet Take 70 mg by mouth every 7 (seven) days. Take with a full glass of water on an empty stomach. On Mondays    . amLODipine (NORVASC) 5 MG tablet Take 1 tablet by mouth daily.    Marland Kitchen apixaban (ELIQUIS) 5 MG TABS tablet Take 1 tablet (5 mg total) by mouth 2 (two) times daily. 180 tablet 3  . Calcium Carbonate-Vitamin D (CALCIUM 600 + D PO) Take 600 mg by mouth 2 (two) times daily.    . dorzolamide-timolol (COSOPT) 22.3-6.8 MG/ML ophthalmic solution USE ONE DROP IN LEFT EYE TWICE DAILY    . ELIQUIS 5 MG TABS tablet TAKE 1 TABLET BY MOUTH TWICE DAILY 60 tablet 9  . enalapril (VASOTEC) 20 MG tablet Take 1 tablet (20 mg total) by mouth 2 (two) times daily. 90 tablet 1  . fish oil-omega-3 fatty acids 1000 MG capsule Take 1 g by mouth 2 (two) times daily.      . isosorbide mononitrate (IMDUR) 30 MG 24 hr tablet Take 30 mg by mouth daily.    . metFORMIN (GLUCOPHAGE) 500 MG tablet Take 500 mg by mouth daily with breakfast.    . Multiple Vitamin (MULTIVITAMIN WITH MINERALS) TABS tablet Take 1 tablet by mouth daily.    . nitroGLYCERIN (NITROSTAT) 0.4 MG SL tablet Place 1 tablet (0.4 mg  total) under the tongue every 5 (five) minutes x 3 doses as needed for chest pain. 25 tablet 3  . potassium chloride SA (K-DUR,KLOR-CON) 20 MEQ tablet Take 1 tablet (20 mEq total) by mouth daily. 90 tablet 3  . pravastatin (PRAVACHOL) 20 MG tablet Take 1 tablet (20 mg total) by mouth every evening. 90 tablet 3  . tamsulosin (FLOMAX) 0.4 MG CAPS capsule Take 0.4 mg by mouth daily.     . traMADol (ULTRAM) 50 MG tablet Take 50 mg by mouth every 6 (six) hours as needed.     . hydrochlorothiazide (HYDRODIURIL) 25 MG tablet Take 1 tablet (25 mg total) by mouth daily. 90 tablet 3   No current facility-administered medications for this visit.      Past Medical History:  Diagnosis Date  . Allergic rhinitis   . Anemia   . Anxiety   . Arthritis    "all over my body" (09/10/2013)  . BPH (benign prostatic hypertrophy)   . CAD (coronary artery disease)    a. 08/2013 LM nl, LAD 34m, D1 80ost, 95p, LCX 37m, RCA 20d, EF 60%.  . Chronic lower back pain   . Depression   . Diabetes mellitus    "diet controlled" (09/10/2013)  . Diaphragmatic hernia without mention of obstruction or gangrene   . Diastolic  congestive heart failure ([place])   . Elevated liver function tests   . Essential hypertension, benign   . Lacunar infarction ([place])    a. 08/2013 post-cath, MRI: sm acute lacunar infarcts in LPCA and LSCA, ? tiny RPICA lacunar infarct.  . Obesity   . OSA on CPAP    "wears mask part of the time" (09/10/2013)  . Osteoporosis   . Sinus node dysfunction ([place])    a. 08/2013 s/p SJM DC PPM.    ROS:   All systems reviewed and negative except as noted in the HPI.   Past Surgical History:  Procedure Laterality Date  . CARDIAC CATHETERIZATION  1998  . CATARACT EXTRACTION W/ INTRAOCULAR LENS  IMPLANT, BILATERAL  2010  . LEFT AND RIGHT HEART CATHETERIZATION WITH CORONARY ANGIOGRAM N/A 09/11/2013   Procedure: LEFT AND RIGHT HEART CATHETERIZATION WITH CORONARY ANGIOGRAM;  Surgeon: Wellington Hampshire, MD;   Location: [facility] CATH LAB;  Service: Cardiovascular;  Laterality: N/A;  . PERMANENT PACEMAKER INSERTION N/A 09/13/2013   Procedure: PERMANENT PACEMAKER INSERTION;  Surgeon: Evans Lance, MD;  [facility] C[facility] LAB;  Service: Cardiovascular;  Laterality: N/A;  . right hip fx  05/26/2016  . right wrist fx  05/26/2016  . STOMACH SURGERY  03/02/2012   ?volvus; "stomach came loose and twisted; went up under rib cage; had that corrected; tacked it up" (09/10/2013)     Family History  Problem Relation Age of Onset  . Hypertension Father   . Hypertension Mother      Social History   Socioeconomic History  . Marital status: Married    Spouse name: Not on file  . Number of children: 2  . Years of education: Not on file  . Highest education level: Not on file  Occupational History  . Occupation: Tax inspector FOR [employer]    Comment: WORKED IN [place]  . Financial resource strain: Not on file  . Food insecurity:    Worry: Not on file    Inability: Not on file  . Transportation needs:    Medical: Not on file    Non-medical: Not on file  Tobacco Use  . Smoking status: Never Smoker  . Smokeless tobacco: Never Used  Substance and Sexual Activity  . Alcohol use: No    Alcohol/week: 0.0 standard drinks  . Drug use: No  . Sexual activity: Never  Lifestyle  . Physical activity:    Days per week: Not on file    Minutes per session: Not on file  . Stress: Not on file  Relationships  . Social connections:    Talks on phone: Not on file    Gets together: Not on file    Attends religious service: Not on file    Active member of club or organization: Not on file    Attends meetings of clubs or organizations: Not on file    Relationship status: Not on file  . Intimate partner violence:    Fear of current or ex partner: Not on file    Emotionally abused: Not on file    Physically abused: Not on file    Forced sexual activity: Not on file  Other Topics Concern  .  Not on file  Social History Narrative  . Not on file     BP (!) 144/80 (BP Location: Right Arm)   Pulse 72   Ht 5\' 10"  (1.778 m)   Wt 184 lb (83.5 kg)   SpO2 98%   BMI  26.40 kg/m   Physical Exam:  Well appearing NAD HEENT: Unremarkable Neck:  No JVD, no thyromegally Lymphatics:  No adenopathy Back:  No CVA tenderness Lungs:  Clear HEART:  Regular rate rhythm, no murmurs, no rubs, no clicks Abd:  soft, positive bowel sounds, no organomegally, no rebound, no guarding Ext:  2 plus pulses, no edema, no cyanosis, no clubbing Skin:  No rashes no nodules Neuro:  CN II through XII intact, motor grossly intact  DEVICE  Normal device function.  See PaceArt for details.   Assess/Plan: 1. Atrial fib - his VR is well controlled. He will continue Eliquis 2. HTN - his blood pressure is up just a bit. He tends to run on the high side. 3. Diastolic heart failure - his symptoms are well controlled.  [name].D.

## 2018-11-01 DIAGNOSIS — R338 Other retention of urine: Secondary | ICD-10-CM | POA: Diagnosis not present

## 2018-11-01 DIAGNOSIS — N401 Enlarged prostate with lower urinary tract symptoms: Secondary | ICD-10-CM | POA: Diagnosis not present

## 2018-11-01 DIAGNOSIS — I10 Essential (primary) hypertension: Secondary | ICD-10-CM | POA: Diagnosis not present

## 2018-11-01 DIAGNOSIS — E119 Type 2 diabetes mellitus without complications: Secondary | ICD-10-CM | POA: Diagnosis not present

## 2018-11-01 DIAGNOSIS — M5137 Other intervertebral disc degeneration, lumbosacral region: Secondary | ICD-10-CM | POA: Diagnosis not present

## 2018-11-01 DIAGNOSIS — I2510 Atherosclerotic heart disease of native coronary artery without angina pectoris: Secondary | ICD-10-CM | POA: Diagnosis not present

## 2018-11-05 DIAGNOSIS — I2510 Atherosclerotic heart disease of native coronary artery without angina pectoris: Secondary | ICD-10-CM | POA: Diagnosis not present

## 2018-11-05 DIAGNOSIS — I10 Essential (primary) hypertension: Secondary | ICD-10-CM | POA: Diagnosis not present

## 2018-11-05 DIAGNOSIS — E119 Type 2 diabetes mellitus without complications: Secondary | ICD-10-CM | POA: Diagnosis not present

## 2018-11-05 DIAGNOSIS — N401 Enlarged prostate with lower urinary tract symptoms: Secondary | ICD-10-CM | POA: Diagnosis not present

## 2018-11-05 DIAGNOSIS — R338 Other retention of urine: Secondary | ICD-10-CM | POA: Diagnosis not present

## 2018-11-05 DIAGNOSIS — M5137 Other intervertebral disc degeneration, lumbosacral region: Secondary | ICD-10-CM | POA: Diagnosis not present

## 2018-11-12 DIAGNOSIS — C642 Malignant neoplasm of left kidney, except renal pelvis: Secondary | ICD-10-CM | POA: Diagnosis not present

## 2018-11-12 DIAGNOSIS — Z299 Encounter for prophylactic measures, unspecified: Secondary | ICD-10-CM | POA: Diagnosis not present

## 2018-11-12 DIAGNOSIS — I10 Essential (primary) hypertension: Secondary | ICD-10-CM | POA: Diagnosis not present

## 2018-11-12 DIAGNOSIS — E1165 Type 2 diabetes mellitus with hyperglycemia: Secondary | ICD-10-CM | POA: Diagnosis not present

## 2018-11-12 DIAGNOSIS — I2510 Atherosclerotic heart disease of native coronary artery without angina pectoris: Secondary | ICD-10-CM | POA: Diagnosis not present

## 2018-11-12 DIAGNOSIS — Z6827 Body mass index (BMI) 27.0-27.9, adult: Secondary | ICD-10-CM | POA: Diagnosis not present

## 2018-11-14 DIAGNOSIS — E119 Type 2 diabetes mellitus without complications: Secondary | ICD-10-CM | POA: Diagnosis not present

## 2018-11-14 DIAGNOSIS — Z961 Presence of intraocular lens: Secondary | ICD-10-CM | POA: Diagnosis not present

## 2018-11-14 DIAGNOSIS — H401123 Primary open-angle glaucoma, left eye, severe stage: Secondary | ICD-10-CM | POA: Diagnosis not present

## 2018-11-19 DIAGNOSIS — M5137 Other intervertebral disc degeneration, lumbosacral region: Secondary | ICD-10-CM | POA: Diagnosis not present

## 2018-11-19 DIAGNOSIS — N401 Enlarged prostate with lower urinary tract symptoms: Secondary | ICD-10-CM | POA: Diagnosis not present

## 2018-11-19 DIAGNOSIS — I10 Essential (primary) hypertension: Secondary | ICD-10-CM | POA: Diagnosis not present

## 2018-11-19 DIAGNOSIS — E119 Type 2 diabetes mellitus without complications: Secondary | ICD-10-CM | POA: Diagnosis not present

## 2018-11-19 DIAGNOSIS — I2510 Atherosclerotic heart disease of native coronary artery without angina pectoris: Secondary | ICD-10-CM | POA: Diagnosis not present

## 2018-11-19 DIAGNOSIS — R338 Other retention of urine: Secondary | ICD-10-CM | POA: Diagnosis not present

## 2018-11-27 DIAGNOSIS — M5137 Other intervertebral disc degeneration, lumbosacral region: Secondary | ICD-10-CM | POA: Diagnosis not present

## 2018-11-27 DIAGNOSIS — N401 Enlarged prostate with lower urinary tract symptoms: Secondary | ICD-10-CM | POA: Diagnosis not present

## 2018-11-27 DIAGNOSIS — I2510 Atherosclerotic heart disease of native coronary artery without angina pectoris: Secondary | ICD-10-CM | POA: Diagnosis not present

## 2018-11-27 DIAGNOSIS — E119 Type 2 diabetes mellitus without complications: Secondary | ICD-10-CM | POA: Diagnosis not present

## 2018-11-27 DIAGNOSIS — R338 Other retention of urine: Secondary | ICD-10-CM | POA: Diagnosis not present

## 2018-11-27 DIAGNOSIS — I10 Essential (primary) hypertension: Secondary | ICD-10-CM | POA: Diagnosis not present

## 2018-12-01 LAB — CUP PACEART REMOTE DEVICE CHECK
Battery Remaining Longevity: 70 mo
Battery Remaining Percentage: 89 %
Battery Voltage: 2.95 V
Brady Statistic AP VS Percent: 1 %
Brady Statistic AS VP Percent: 34 %
Brady Statistic AS VS Percent: 1 %
Brady Statistic RA Percent Paced: 26 %
Brady Statistic RV Percent Paced: 70 %
Date Time Interrogation Session: 20191021124920
Implantable Lead Implant Date: 20140919
Implantable Lead Location: 753860
Lead Channel Pacing Threshold Amplitude: 0.75 V
Lead Channel Pacing Threshold Pulse Width: 0.5 ms
Lead Channel Sensing Intrinsic Amplitude: 4 mV
Lead Channel Sensing Intrinsic Amplitude: 5.4 mV
Lead Channel Setting Pacing Amplitude: 1 V
Lead Channel Setting Pacing Amplitude: 5 V
Lead Channel Setting Pacing Pulse Width: 0.5 ms
Lead Channel Setting Sensing Sensitivity: 2 mV
MDC IDC LEAD IMPLANT DT: 20140919
MDC IDC LEAD LOCATION: 753859
MDC IDC MSMT LEADCHNL RA IMPEDANCE VALUE: 380 Ohm
MDC IDC MSMT LEADCHNL RA PACING THRESHOLD AMPLITUDE: 0.75 V
MDC IDC MSMT LEADCHNL RV IMPEDANCE VALUE: 400 Ohm
MDC IDC MSMT LEADCHNL RV PACING THRESHOLD PULSEWIDTH: 0.5 ms
MDC IDC PG IMPLANT DT: 20140919
MDC IDC PG SERIAL: 7536055
MDC IDC STAT BRADY AP VP PERCENT: 65 %

## 2018-12-03 DIAGNOSIS — R338 Other retention of urine: Secondary | ICD-10-CM | POA: Diagnosis not present

## 2018-12-05 DIAGNOSIS — N401 Enlarged prostate with lower urinary tract symptoms: Secondary | ICD-10-CM | POA: Diagnosis not present

## 2018-12-05 DIAGNOSIS — I10 Essential (primary) hypertension: Secondary | ICD-10-CM | POA: Diagnosis not present

## 2018-12-05 DIAGNOSIS — R338 Other retention of urine: Secondary | ICD-10-CM | POA: Diagnosis not present

## 2018-12-05 DIAGNOSIS — E119 Type 2 diabetes mellitus without complications: Secondary | ICD-10-CM | POA: Diagnosis not present

## 2018-12-05 DIAGNOSIS — I2510 Atherosclerotic heart disease of native coronary artery without angina pectoris: Secondary | ICD-10-CM | POA: Diagnosis not present

## 2018-12-05 DIAGNOSIS — M5137 Other intervertebral disc degeneration, lumbosacral region: Secondary | ICD-10-CM | POA: Diagnosis not present

## 2018-12-06 ENCOUNTER — Other Ambulatory Visit: Payer: Self-pay | Admitting: *Deleted

## 2018-12-06 MED ORDER — HYDROCHLOROTHIAZIDE 25 MG PO TABS: 25.0000 mg | ORAL_TABLET | Freq: Every day | ORAL | 3 refills | Status: DC

## 2018-12-10 DIAGNOSIS — I10 Essential (primary) hypertension: Secondary | ICD-10-CM | POA: Diagnosis not present

## 2018-12-10 DIAGNOSIS — N2889 Other specified disorders of kidney and ureter: Secondary | ICD-10-CM | POA: Diagnosis not present

## 2018-12-10 DIAGNOSIS — M810 Age-related osteoporosis without current pathological fracture: Secondary | ICD-10-CM | POA: Diagnosis not present

## 2018-12-10 DIAGNOSIS — I2510 Atherosclerotic heart disease of native coronary artery without angina pectoris: Secondary | ICD-10-CM | POA: Diagnosis not present

## 2018-12-10 DIAGNOSIS — Z950 Presence of cardiac pacemaker: Secondary | ICD-10-CM | POA: Diagnosis not present

## 2018-12-10 DIAGNOSIS — Z792 Long term (current) use of antibiotics: Secondary | ICD-10-CM | POA: Diagnosis not present

## 2018-12-10 DIAGNOSIS — Z466 Encounter for fitting and adjustment of urinary device: Secondary | ICD-10-CM | POA: Diagnosis not present

## 2018-12-10 DIAGNOSIS — G4730 Sleep apnea, unspecified: Secondary | ICD-10-CM | POA: Diagnosis not present

## 2018-12-10 DIAGNOSIS — M5137 Other intervertebral disc degeneration, lumbosacral region: Secondary | ICD-10-CM | POA: Diagnosis not present

## 2018-12-10 DIAGNOSIS — Z7901 Long term (current) use of anticoagulants: Secondary | ICD-10-CM | POA: Diagnosis not present

## 2018-12-10 DIAGNOSIS — H9190 Unspecified hearing loss, unspecified ear: Secondary | ICD-10-CM | POA: Diagnosis not present

## 2018-12-10 DIAGNOSIS — Z8781 Personal history of (healed) traumatic fracture: Secondary | ICD-10-CM | POA: Diagnosis not present

## 2018-12-10 DIAGNOSIS — E119 Type 2 diabetes mellitus without complications: Secondary | ICD-10-CM | POA: Diagnosis not present

## 2018-12-10 DIAGNOSIS — Z7984 Long term (current) use of oral hypoglycemic drugs: Secondary | ICD-10-CM | POA: Diagnosis not present

## 2018-12-10 DIAGNOSIS — R338 Other retention of urine: Secondary | ICD-10-CM | POA: Diagnosis not present

## 2018-12-10 DIAGNOSIS — R339 Retention of urine, unspecified: Secondary | ICD-10-CM | POA: Diagnosis not present

## 2018-12-10 DIAGNOSIS — N401 Enlarged prostate with lower urinary tract symptoms: Secondary | ICD-10-CM | POA: Diagnosis not present

## 2018-12-11 DIAGNOSIS — M47816 Spondylosis without myelopathy or radiculopathy, lumbar region: Secondary | ICD-10-CM | POA: Diagnosis not present

## 2018-12-12 DIAGNOSIS — R338 Other retention of urine: Secondary | ICD-10-CM | POA: Diagnosis not present

## 2018-12-12 DIAGNOSIS — Z466 Encounter for fitting and adjustment of urinary device: Secondary | ICD-10-CM | POA: Diagnosis not present

## 2018-12-12 DIAGNOSIS — I2510 Atherosclerotic heart disease of native coronary artery without angina pectoris: Secondary | ICD-10-CM | POA: Diagnosis not present

## 2018-12-12 DIAGNOSIS — M5137 Other intervertebral disc degeneration, lumbosacral region: Secondary | ICD-10-CM | POA: Diagnosis not present

## 2018-12-12 DIAGNOSIS — E119 Type 2 diabetes mellitus without complications: Secondary | ICD-10-CM | POA: Diagnosis not present

## 2018-12-12 DIAGNOSIS — N401 Enlarged prostate with lower urinary tract symptoms: Secondary | ICD-10-CM | POA: Diagnosis not present

## 2018-12-21 DIAGNOSIS — Z0000 Encounter for general adult medical examination without abnormal findings: Secondary | ICD-10-CM | POA: Diagnosis not present

## 2018-12-21 DIAGNOSIS — Z6826 Body mass index (BMI) 26.0-26.9, adult: Secondary | ICD-10-CM | POA: Diagnosis not present

## 2018-12-21 DIAGNOSIS — E1165 Type 2 diabetes mellitus with hyperglycemia: Secondary | ICD-10-CM | POA: Diagnosis not present

## 2018-12-21 DIAGNOSIS — Z1339 Encounter for screening examination for other mental health and behavioral disorders: Secondary | ICD-10-CM | POA: Diagnosis not present

## 2018-12-21 DIAGNOSIS — Z125 Encounter for screening for malignant neoplasm of prostate: Secondary | ICD-10-CM | POA: Diagnosis not present

## 2018-12-21 DIAGNOSIS — Z7189 Other specified counseling: Secondary | ICD-10-CM | POA: Diagnosis not present

## 2018-12-21 DIAGNOSIS — C642 Malignant neoplasm of left kidney, except renal pelvis: Secondary | ICD-10-CM | POA: Diagnosis not present

## 2018-12-21 DIAGNOSIS — Z299 Encounter for prophylactic measures, unspecified: Secondary | ICD-10-CM | POA: Diagnosis not present

## 2018-12-21 DIAGNOSIS — Z1331 Encounter for screening for depression: Secondary | ICD-10-CM | POA: Diagnosis not present

## 2018-12-21 DIAGNOSIS — E7800 Pure hypercholesterolemia, unspecified: Secondary | ICD-10-CM | POA: Diagnosis not present

## 2018-12-21 DIAGNOSIS — I10 Essential (primary) hypertension: Secondary | ICD-10-CM | POA: Diagnosis not present

## 2018-12-21 DIAGNOSIS — R5383 Other fatigue: Secondary | ICD-10-CM | POA: Diagnosis not present

## 2018-12-21 DIAGNOSIS — Z79899 Other long term (current) drug therapy: Secondary | ICD-10-CM | POA: Diagnosis not present

## 2018-12-21 DIAGNOSIS — Z1211 Encounter for screening for malignant neoplasm of colon: Secondary | ICD-10-CM | POA: Diagnosis not present

## 2018-12-25 DIAGNOSIS — R338 Other retention of urine: Secondary | ICD-10-CM | POA: Diagnosis not present

## 2018-12-25 DIAGNOSIS — N401 Enlarged prostate with lower urinary tract symptoms: Secondary | ICD-10-CM | POA: Diagnosis not present

## 2018-12-25 DIAGNOSIS — M5137 Other intervertebral disc degeneration, lumbosacral region: Secondary | ICD-10-CM | POA: Diagnosis not present

## 2018-12-25 DIAGNOSIS — E119 Type 2 diabetes mellitus without complications: Secondary | ICD-10-CM | POA: Diagnosis not present

## 2018-12-25 DIAGNOSIS — Z466 Encounter for fitting and adjustment of urinary device: Secondary | ICD-10-CM | POA: Diagnosis not present

## 2018-12-25 DIAGNOSIS — I2510 Atherosclerotic heart disease of native coronary artery without angina pectoris: Secondary | ICD-10-CM | POA: Diagnosis not present

## 2018-12-28 DIAGNOSIS — R338 Other retention of urine: Secondary | ICD-10-CM | POA: Diagnosis not present

## 2018-12-28 DIAGNOSIS — I2510 Atherosclerotic heart disease of native coronary artery without angina pectoris: Secondary | ICD-10-CM | POA: Diagnosis not present

## 2018-12-28 DIAGNOSIS — N401 Enlarged prostate with lower urinary tract symptoms: Secondary | ICD-10-CM | POA: Diagnosis not present

## 2018-12-28 DIAGNOSIS — Z466 Encounter for fitting and adjustment of urinary device: Secondary | ICD-10-CM | POA: Diagnosis not present

## 2018-12-28 DIAGNOSIS — E119 Type 2 diabetes mellitus without complications: Secondary | ICD-10-CM | POA: Diagnosis not present

## 2018-12-28 DIAGNOSIS — M5137 Other intervertebral disc degeneration, lumbosacral region: Secondary | ICD-10-CM | POA: Diagnosis not present

## 2019-01-02 DIAGNOSIS — R338 Other retention of urine: Secondary | ICD-10-CM | POA: Diagnosis not present

## 2019-01-02 DIAGNOSIS — M5137 Other intervertebral disc degeneration, lumbosacral region: Secondary | ICD-10-CM | POA: Diagnosis not present

## 2019-01-02 DIAGNOSIS — Z466 Encounter for fitting and adjustment of urinary device: Secondary | ICD-10-CM | POA: Diagnosis not present

## 2019-01-02 DIAGNOSIS — N401 Enlarged prostate with lower urinary tract symptoms: Secondary | ICD-10-CM | POA: Diagnosis not present

## 2019-01-08 DIAGNOSIS — I2510 Atherosclerotic heart disease of native coronary artery without angina pectoris: Secondary | ICD-10-CM | POA: Diagnosis not present

## 2019-01-08 DIAGNOSIS — E119 Type 2 diabetes mellitus without complications: Secondary | ICD-10-CM | POA: Diagnosis not present

## 2019-01-08 DIAGNOSIS — M5137 Other intervertebral disc degeneration, lumbosacral region: Secondary | ICD-10-CM | POA: Diagnosis not present

## 2019-01-08 DIAGNOSIS — N401 Enlarged prostate with lower urinary tract symptoms: Secondary | ICD-10-CM | POA: Diagnosis not present

## 2019-01-08 DIAGNOSIS — Z466 Encounter for fitting and adjustment of urinary device: Secondary | ICD-10-CM | POA: Diagnosis not present

## 2019-01-08 DIAGNOSIS — R338 Other retention of urine: Secondary | ICD-10-CM | POA: Diagnosis not present

## 2019-01-14 ENCOUNTER — Ambulatory Visit (INDEPENDENT_AMBULATORY_CARE_PROVIDER_SITE_OTHER): Payer: Medicare Other

## 2019-01-14 DIAGNOSIS — I495 Sick sinus syndrome: Secondary | ICD-10-CM | POA: Diagnosis not present

## 2019-01-15 LAB — CUP PACEART REMOTE DEVICE CHECK
Battery Remaining Longevity: 124 mo
Battery Voltage: 2.98 V
Brady Statistic RV Percent Paced: 35 %
Date Time Interrogation Session: 20200120085341
Implantable Lead Implant Date: 20140919
Implantable Lead Location: 753860
Implantable Pulse Generator Implant Date: 20140919
Lead Channel Pacing Threshold Amplitude: 0.75 V
Lead Channel Sensing Intrinsic Amplitude: 5 mV
Lead Channel Setting Pacing Amplitude: 2.5 V
Lead Channel Setting Pacing Pulse Width: 0.5 ms
Lead Channel Setting Sensing Sensitivity: 2 mV
MDC IDC LEAD IMPLANT DT: 20140919
MDC IDC LEAD LOCATION: 753859
MDC IDC MSMT BATTERY REMAINING PERCENTAGE: 95.5 %
MDC IDC MSMT LEADCHNL RV IMPEDANCE VALUE: 410 Ohm
MDC IDC MSMT LEADCHNL RV PACING THRESHOLD PULSEWIDTH: 0.5 ms
MDC IDC PG SERIAL: 7536055
Pulse Gen Model: 2240

## 2019-01-15 NOTE — Progress Notes (Signed)
Remote pacemaker transmission.   

## 2019-01-18 ENCOUNTER — Telehealth: Payer: Self-pay | Admitting: Cardiology

## 2019-01-18 NOTE — Telephone Encounter (Signed)
Having problems with affording co-pay for Eliquis

## 2019-01-18 NOTE — Telephone Encounter (Signed)
Returned pt call. Will leave samples of Eliquis for pt.

## 2019-01-24 DIAGNOSIS — R338 Other retention of urine: Secondary | ICD-10-CM | POA: Diagnosis not present

## 2019-01-24 DIAGNOSIS — E119 Type 2 diabetes mellitus without complications: Secondary | ICD-10-CM | POA: Diagnosis not present

## 2019-01-24 DIAGNOSIS — I2510 Atherosclerotic heart disease of native coronary artery without angina pectoris: Secondary | ICD-10-CM | POA: Diagnosis not present

## 2019-01-24 DIAGNOSIS — M5137 Other intervertebral disc degeneration, lumbosacral region: Secondary | ICD-10-CM | POA: Diagnosis not present

## 2019-01-24 DIAGNOSIS — Z466 Encounter for fitting and adjustment of urinary device: Secondary | ICD-10-CM | POA: Diagnosis not present

## 2019-01-24 DIAGNOSIS — N401 Enlarged prostate with lower urinary tract symptoms: Secondary | ICD-10-CM | POA: Diagnosis not present

## 2019-02-05 DIAGNOSIS — R5383 Other fatigue: Secondary | ICD-10-CM | POA: Diagnosis not present

## 2019-02-06 ENCOUNTER — Encounter (INDEPENDENT_AMBULATORY_CARE_PROVIDER_SITE_OTHER): Payer: Medicare Other | Admitting: Ophthalmology

## 2019-02-06 DIAGNOSIS — H353112 Nonexudative age-related macular degeneration, right eye, intermediate dry stage: Secondary | ICD-10-CM

## 2019-02-06 DIAGNOSIS — H43813 Vitreous degeneration, bilateral: Secondary | ICD-10-CM

## 2019-02-06 DIAGNOSIS — H34832 Tributary (branch) retinal vein occlusion, left eye, with macular edema: Secondary | ICD-10-CM

## 2019-02-06 DIAGNOSIS — H35033 Hypertensive retinopathy, bilateral: Secondary | ICD-10-CM

## 2019-02-06 DIAGNOSIS — I10 Essential (primary) hypertension: Secondary | ICD-10-CM

## 2019-02-07 DIAGNOSIS — Z466 Encounter for fitting and adjustment of urinary device: Secondary | ICD-10-CM | POA: Diagnosis not present

## 2019-02-07 DIAGNOSIS — R338 Other retention of urine: Secondary | ICD-10-CM | POA: Diagnosis not present

## 2019-02-07 DIAGNOSIS — I2510 Atherosclerotic heart disease of native coronary artery without angina pectoris: Secondary | ICD-10-CM | POA: Diagnosis not present

## 2019-02-07 DIAGNOSIS — N401 Enlarged prostate with lower urinary tract symptoms: Secondary | ICD-10-CM | POA: Diagnosis not present

## 2019-02-07 DIAGNOSIS — E119 Type 2 diabetes mellitus without complications: Secondary | ICD-10-CM | POA: Diagnosis not present

## 2019-02-07 DIAGNOSIS — M5137 Other intervertebral disc degeneration, lumbosacral region: Secondary | ICD-10-CM | POA: Diagnosis not present

## 2019-02-26 DIAGNOSIS — E039 Hypothyroidism, unspecified: Secondary | ICD-10-CM | POA: Diagnosis not present

## 2019-03-04 DIAGNOSIS — E0590 Thyrotoxicosis, unspecified without thyrotoxic crisis or storm: Secondary | ICD-10-CM | POA: Diagnosis not present

## 2019-03-04 DIAGNOSIS — E041 Nontoxic single thyroid nodule: Secondary | ICD-10-CM | POA: Diagnosis not present

## 2019-03-11 DIAGNOSIS — N319 Neuromuscular dysfunction of bladder, unspecified: Secondary | ICD-10-CM | POA: Diagnosis not present

## 2019-03-11 DIAGNOSIS — R339 Retention of urine, unspecified: Secondary | ICD-10-CM | POA: Diagnosis not present

## 2019-03-14 DIAGNOSIS — M47816 Spondylosis without myelopathy or radiculopathy, lumbar region: Secondary | ICD-10-CM | POA: Diagnosis not present

## 2019-03-14 DIAGNOSIS — M4316 Spondylolisthesis, lumbar region: Secondary | ICD-10-CM | POA: Diagnosis not present

## 2019-03-19 ENCOUNTER — Other Ambulatory Visit: Payer: Self-pay | Admitting: Cardiology

## 2019-03-28 ENCOUNTER — Ambulatory Visit: Payer: Medicare Other | Admitting: Cardiology

## 2019-03-28 DIAGNOSIS — I10 Essential (primary) hypertension: Secondary | ICD-10-CM | POA: Diagnosis not present

## 2019-03-28 DIAGNOSIS — I4891 Unspecified atrial fibrillation: Secondary | ICD-10-CM | POA: Diagnosis not present

## 2019-03-28 DIAGNOSIS — E1165 Type 2 diabetes mellitus with hyperglycemia: Secondary | ICD-10-CM | POA: Diagnosis not present

## 2019-03-28 DIAGNOSIS — Z299 Encounter for prophylactic measures, unspecified: Secondary | ICD-10-CM | POA: Diagnosis not present

## 2019-03-28 DIAGNOSIS — I429 Cardiomyopathy, unspecified: Secondary | ICD-10-CM | POA: Diagnosis not present

## 2019-04-03 ENCOUNTER — Ambulatory Visit: Payer: Medicare Other | Admitting: Cardiology

## 2019-04-15 ENCOUNTER — Ambulatory Visit (INDEPENDENT_AMBULATORY_CARE_PROVIDER_SITE_OTHER): Payer: Medicare Other | Admitting: *Deleted

## 2019-04-15 ENCOUNTER — Other Ambulatory Visit: Payer: Self-pay

## 2019-04-15 DIAGNOSIS — I495 Sick sinus syndrome: Secondary | ICD-10-CM | POA: Diagnosis not present

## 2019-04-15 LAB — CUP PACEART REMOTE DEVICE CHECK
Battery Remaining Longevity: 119 mo
Battery Remaining Percentage: 95.5 %
Battery Voltage: 2.96 V
Brady Statistic RV Percent Paced: 41 %
Date Time Interrogation Session: 20200420093736
Implantable Lead Implant Date: 20140919
Implantable Lead Implant Date: 20140919
Implantable Lead Location: 753859
Implantable Lead Location: 753860
Implantable Pulse Generator Implant Date: 20140919
Lead Channel Impedance Value: 440 Ohm
Lead Channel Pacing Threshold Amplitude: 0.75 V
Lead Channel Pacing Threshold Pulse Width: 0.5 ms
Lead Channel Sensing Intrinsic Amplitude: 5.2 mV
Lead Channel Setting Pacing Amplitude: 2.5 V
Lead Channel Setting Pacing Pulse Width: 0.5 ms
Lead Channel Setting Sensing Sensitivity: 2 mV
Pulse Gen Model: 2240
Pulse Gen Serial Number: 7536055

## 2019-04-24 NOTE — Progress Notes (Signed)
Remote pacemaker transmission.   

## 2019-04-25 DIAGNOSIS — I10 Essential (primary) hypertension: Secondary | ICD-10-CM | POA: Diagnosis not present

## 2019-04-30 DIAGNOSIS — E039 Hypothyroidism, unspecified: Secondary | ICD-10-CM | POA: Diagnosis not present

## 2019-05-08 ENCOUNTER — Other Ambulatory Visit: Payer: Self-pay | Admitting: Cardiology

## 2019-05-24 DIAGNOSIS — I10 Essential (primary) hypertension: Secondary | ICD-10-CM | POA: Diagnosis not present

## 2019-05-29 ENCOUNTER — Other Ambulatory Visit: Payer: Self-pay | Admitting: Cardiology

## 2019-05-29 DIAGNOSIS — E876 Hypokalemia: Secondary | ICD-10-CM

## 2019-05-30 ENCOUNTER — Ambulatory Visit (INDEPENDENT_AMBULATORY_CARE_PROVIDER_SITE_OTHER): Payer: Medicare Other | Admitting: Otolaryngology

## 2019-05-30 ENCOUNTER — Other Ambulatory Visit: Payer: Self-pay

## 2019-05-30 DIAGNOSIS — H95123 Granulation of postmastoidectomy cavity, bilateral ears: Secondary | ICD-10-CM | POA: Diagnosis not present

## 2019-05-30 DIAGNOSIS — D440 Neoplasm of uncertain behavior of thyroid gland: Secondary | ICD-10-CM

## 2019-06-18 DIAGNOSIS — I10 Essential (primary) hypertension: Secondary | ICD-10-CM | POA: Diagnosis not present

## 2019-06-19 ENCOUNTER — Other Ambulatory Visit: Payer: Self-pay

## 2019-06-19 ENCOUNTER — Encounter (INDEPENDENT_AMBULATORY_CARE_PROVIDER_SITE_OTHER): Payer: Medicare Other | Admitting: Ophthalmology

## 2019-06-19 DIAGNOSIS — H35033 Hypertensive retinopathy, bilateral: Secondary | ICD-10-CM | POA: Diagnosis not present

## 2019-06-19 DIAGNOSIS — I10 Essential (primary) hypertension: Secondary | ICD-10-CM | POA: Diagnosis not present

## 2019-06-19 DIAGNOSIS — H43813 Vitreous degeneration, bilateral: Secondary | ICD-10-CM

## 2019-06-19 DIAGNOSIS — H34832 Tributary (branch) retinal vein occlusion, left eye, with macular edema: Secondary | ICD-10-CM | POA: Diagnosis not present

## 2019-06-19 DIAGNOSIS — H353112 Nonexudative age-related macular degeneration, right eye, intermediate dry stage: Secondary | ICD-10-CM | POA: Diagnosis not present

## 2019-07-02 DIAGNOSIS — E1165 Type 2 diabetes mellitus with hyperglycemia: Secondary | ICD-10-CM | POA: Diagnosis not present

## 2019-07-02 DIAGNOSIS — Z299 Encounter for prophylactic measures, unspecified: Secondary | ICD-10-CM | POA: Diagnosis not present

## 2019-07-02 DIAGNOSIS — Z789 Other specified health status: Secondary | ICD-10-CM | POA: Diagnosis not present

## 2019-07-02 DIAGNOSIS — I10 Essential (primary) hypertension: Secondary | ICD-10-CM | POA: Diagnosis not present

## 2019-07-02 DIAGNOSIS — Z6825 Body mass index (BMI) 25.0-25.9, adult: Secondary | ICD-10-CM | POA: Diagnosis not present

## 2019-07-02 DIAGNOSIS — I429 Cardiomyopathy, unspecified: Secondary | ICD-10-CM | POA: Diagnosis not present

## 2019-07-02 DIAGNOSIS — I4891 Unspecified atrial fibrillation: Secondary | ICD-10-CM | POA: Diagnosis not present

## 2019-07-15 ENCOUNTER — Ambulatory Visit (INDEPENDENT_AMBULATORY_CARE_PROVIDER_SITE_OTHER): Payer: Medicare Other | Admitting: *Deleted

## 2019-07-15 ENCOUNTER — Other Ambulatory Visit: Payer: Self-pay

## 2019-07-15 DIAGNOSIS — I495 Sick sinus syndrome: Secondary | ICD-10-CM

## 2019-07-15 LAB — CUP PACEART REMOTE DEVICE CHECK
Date Time Interrogation Session: 20200720171940
Implantable Lead Implant Date: 20140919
Implantable Lead Implant Date: 20140919
Implantable Lead Location: 753859
Implantable Lead Location: 753860
Implantable Pulse Generator Implant Date: 20140919
Pulse Gen Model: 2240
Pulse Gen Serial Number: 7536055

## 2019-07-17 ENCOUNTER — Encounter: Payer: Self-pay | Admitting: Cardiology

## 2019-07-17 ENCOUNTER — Other Ambulatory Visit: Payer: Self-pay

## 2019-07-17 ENCOUNTER — Encounter: Payer: Self-pay | Admitting: *Deleted

## 2019-07-17 ENCOUNTER — Ambulatory Visit (INDEPENDENT_AMBULATORY_CARE_PROVIDER_SITE_OTHER): Payer: Medicare Other | Admitting: Cardiology

## 2019-07-17 VITALS — BP 130/74 | HR 81 | Ht 72.0 in | Wt 182.0 lb

## 2019-07-17 DIAGNOSIS — I4891 Unspecified atrial fibrillation: Secondary | ICD-10-CM

## 2019-07-17 DIAGNOSIS — Z8679 Personal history of other diseases of the circulatory system: Secondary | ICD-10-CM | POA: Diagnosis not present

## 2019-07-17 DIAGNOSIS — I2510 Atherosclerotic heart disease of native coronary artery without angina pectoris: Secondary | ICD-10-CM | POA: Diagnosis not present

## 2019-07-17 DIAGNOSIS — E782 Mixed hyperlipidemia: Secondary | ICD-10-CM

## 2019-07-17 DIAGNOSIS — I10 Essential (primary) hypertension: Secondary | ICD-10-CM | POA: Diagnosis not present

## 2019-07-17 MED ORDER — NITROGLYCERIN 0.4 MG SL SUBL: 0.4000 mg | SUBLINGUAL_TABLET | SUBLINGUAL | 3 refills | Status: DC | PRN

## 2019-07-17 NOTE — Progress Notes (Signed)
Clinical Summary Mr. Pressnell is a 85 y.o.male seen today for follow up of the following medical problems.   1. Mild systolic dysfunction  - echo 07/2012 w/ LVEF 40-45%  - Cath 08/2013 w/ minimal CAD, LV gram 60% with normal filling pressures on RHC.     - no recent SOB/DOE. Stable LE edema, he has not been interested in diuretic changes    2. HTN   - accepting higher bp's due to prior dizziness -enalapril dose lowered recently by pcp due to some SBPs in the 90s  3. Bradycardia  -s/p St Jude dual chamber pacemaker  - followed by EP  7/20 remote check was normal.  - no recent symptoms  4. Hyperlipidemia -compliant with statin  5. Afib  - no recent palpitations. No bleeding on eliquis.   6. CAD  - patent major arteries with evidence of disease in diags and OMs fromprior cath  -no recent chest pain. No SOB/DOE.   SH: wife on chemo and radiationalong with recent hysterectomy, endometrialcancer.Just completed treatments, told she is cancer free.    Past Medical History:  Diagnosis Date  . Allergic rhinitis   . Anemia   . Anxiety   . Arthritis    "all over my body" (09/10/2013)  . BPH (benign prostatic hypertrophy)   . CAD (coronary artery disease)    a. 08/2013 LM nl, LAD 65m, D1 80ost, 95p, LCX 21m, RCA 20d, EF 60%.  . Chronic lower back pain   . Depression   . Diabetes mellitus    "diet controlled" (09/10/2013)  . Diaphragmatic hernia without mention of obstruction or gangrene   . Diastolic congestive heart failure ([place])   . Elevated liver function tests   . Essential hypertension, benign   . Lacunar infarction ([place])    a. 08/2013 post-cath, MRI: sm acute lacunar infarcts in LPCA and LSCA, ? tiny RPICA lacunar infarct.  . Obesity   . OSA on CPAP    "wears mask part of the time" (09/10/2013)  . Osteoporosis   . Sinus node dysfunction ([place])    a. 08/2013 s/p SJM DC PPM.     Allergies  Allergen Reactions  . Fentanyl Other (See  Comments)    resp suppression per wife, out for 2 days      Current Outpatient Medications  Medication Sig Dispense Refill  . acetaminophen (TYLENOL) 500 MG tablet Take 500 mg by mouth every 6 (six) hours as needed for pain.    Marland Kitchen alendronate (FOSAMAX) 70 MG tablet Take 70 mg by mouth every 7 (seven) days. Take with a full glass of water on an empty stomach. On Mondays    . amLODipine (NORVASC) 5 MG tablet Take 1 tablet by mouth daily.    Marland Kitchen apixaban (ELIQUIS) 5 MG TABS tablet Take 1 tablet (5 mg total) by mouth 2 (two) times daily. 180 tablet 3  . Calcium Carbonate-Vitamin D (CALCIUM 600 + D PO) Take 600 mg by mouth 2 (two) times daily.    . dorzolamide-timolol (COSOPT) 22.3-6.8 MG/ML ophthalmic solution USE ONE DROP IN LEFT EYE TWICE DAILY    . ELIQUIS 5 MG TABS tablet TAKE 1 TABLET BY MOUTH TWICE DAILY 60 tablet 11  . enalapril (VASOTEC) 20 MG tablet Take 1 tablet (20 mg total) by mouth 2 (two) times daily. 90 tablet 1  . fish oil-omega-3 fatty acids 1000 MG capsule Take 1 g by mouth 2 (two) times daily.      . hydrochlorothiazide (HYDRODIURIL)  25 MG tablet Take 1 tablet (25 mg total) by mouth daily. 90 tablet 3  . isosorbide mononitrate (IMDUR) 30 MG 24 hr tablet Take 30 mg by mouth daily.    . metFORMIN (GLUCOPHAGE) 500 MG tablet Take 500 mg by mouth daily with breakfast.    . Multiple Vitamin (MULTIVITAMIN WITH MINERALS) TABS tablet Take 1 tablet by mouth daily.    . nitroGLYCERIN (NITROSTAT) 0.4 MG SL tablet Place 1 tablet (0.4 mg total) under the tongue every 5 (five) minutes x 3 doses as needed for chest pain. 25 tablet 3  . potassium chloride SA (K-DUR) 20 MEQ tablet TAKE 1 TABLET EVERY DAY 90 tablet 3  . pravastatin (PRAVACHOL) 20 MG tablet TAKE 1 TABLET (20 MG TOTAL) BY MOUTH EVERY EVENING. 90 tablet 3  . tamsulosin (FLOMAX) 0.4 MG CAPS capsule Take 0.4 mg by mouth daily.     . traMADol (ULTRAM) 50 MG tablet Take 50 mg by mouth every 6 (six) hours as needed.      No current  facility-administered medications for this visit.      Past Surgical History:  Procedure Laterality Date  . CARDIAC CATHETERIZATION  1998  . CATARACT EXTRACTION W/ INTRAOCULAR LENS  IMPLANT, BILATERAL  2010  . LEFT AND RIGHT HEART CATHETERIZATION WITH CORONARY ANGIOGRAM N/A 09/11/2013   Procedure: LEFT AND RIGHT HEART CATHETERIZATION WITH CORONARY ANGIOGRAM;  Surgeon: Wellington Hampshire, MD;  Location: [facility] CATH LAB;  Service: Cardiovascular;  Laterality: N/A;  . PERMANENT PACEMAKER INSERTION N/A 09/13/2013   Procedure: PERMANENT PACEMAKER INSERTION;  Surgeon: Evans Lance, MD;  Location: [facility] CATH LAB;  Service: Cardiovascular;  Laterality: N/A;  . right hip fx  05/26/2016  . right wrist fx  05/26/2016  . STOMACH SURGERY  03/02/2012   ?volvus; "stomach came loose and twisted; went up under rib cage; had that corrected; tacked it up" (09/10/2013)     Allergies  Allergen Reactions  . Fentanyl Other (See Comments)    resp suppression per wife, out for 2 days       Family History  Problem Relation Age of Onset  . Hypertension Father   . Hypertension Mother      Social History Mr. Allman reports that he has never smoked. He has never used smokeless tobacco. Mr. Poitras reports no history of alcohol use.   Review of Systems CONSTITUTIONAL: No weight loss, fever, chills, weakness or fatigue.  HEENT: Eyes: No visual loss, blurred vision, double vision or yellow sclerae.No hearing loss, sneezing, congestion, runny nose or sore throat.  SKIN: No rash or itching.  CARDIOVASCULAR: per hpi RESPIRATORY: No shortness of breath, cough or sputum.  GASTROINTESTINAL: No anorexia, nausea, vomiting or diarrhea. No abdominal pain or blood.  GENITOURINARY: No burning on urination, no polyuria NEUROLOGICAL: No headache, dizziness, syncope, paralysis, ataxia, numbness or tingling in the extremities. No change in bowel or bladder control.  MUSCULOSKELETAL: No muscle, back pain, joint pain or stiffness.   LYMPHATICS: No enlarged nodes. No history of splenectomy.  PSYCHIATRIC: No history of depression or anxiety.  ENDOCRINOLOGIC: No reports of sweating, cold or heat intolerance. No polyuria or polydipsia.  Marland Kitchen   Physical Examination Today's Vitals   07/17/19 0900  BP: 130/74  Pulse: 81  SpO2: 98%  Weight: 182 lb (82.6 kg)  Height: 6' (1.829 m)   Body mass index is 24.68 kg/m.  Gen: resting comfortably, no acute distress HEENT: no scleral icterus, pupils equal round and reactive, no palptable cervical adenopathy,  CV:  irreg, no m/r/g, no jvd Resp: Clear to auscultation bilaterally GI: abdomen is soft, non-tender, non-distended, normal bowel sounds, no hepatosplenomegaly MSK: extremities are warm, 1-2+ bilateral LE edema Skin: warm, no rash Neuro:  no focal deficits Psych: appropriate affect   Diagnostic Studies Cardiac Catheterization 9.17.2014 Procedural Findings:  Hemodynamics RA 3 mmHg  RV 37/1 mmHg  PA 33/12 mmHg  PCWP 6 mmHg  LV 201/4 mHg . LVEDP: 12 mmHg  AO 202/94 mmHg  Oxygen saturations:  PA 74%  AO 94%  Cardiac Output (Fick) 6.72  Cardiac Index (Fick) 3.22  Pulmonary vascular resistance (PVR): 1.9 Woods units.  Coronary angiography: Coronary dominance: right   Left Main: Normal in size with no significant disease.   Left Anterior Descending (LAD): Large in size with mild diffuse atherosclerosis. There is diffuse 20% disease in the midsegment.   1st diagonal (D1): Normal in size with 80% ostial stenosis followed by a 95% proximal stenosis.   2nd diagonal (D2): Medium in size with minor irregularities.   3rd diagonal (D3): Normal in size with no significant disease.   Circumflex (LCx): Normal in size and nondominant. There is 20% mid stenosis.   1st obtuse marginal: Medium in size with minor irregularities.   2nd obtuse marginal: Large is in size with no significant disease.   3rd obtuse marginal: Normal in size with  minor irregularities.   Right Coronary Artery: large in size and dominant. There is 20% stenosis distally.   Posterior descending artery: normal in size with no significant disease.   Posterior AV segment: normal in size with no significant disease.   Posterolateral branchs: PL 1 is large with no significant disease. PL 2 is small. Left ventriculography: Left ventricular systolic function is normal , LVEF is estimated at 60 %, there is no significant mitral regurgitation   07/2012 Echo: mild LVH, LVEF 40-45%, LAE, mild MR,     Assessment and Plan  1. History of cardiomyopathy - LVEF has since normalized, still with some diastolic dysfunction - chronic LE edema without other cardiopulmonary symptoms, he has not been intersted in more aggressive diuretic dosing. With prior issues with dizziness and absence of other symptoms I think this is fine, he has actually been losing weight over the last few visits  2. CAD  - patent major arteries with evidence of disease in diags and OMs fromprior cath -no symptoms, continue current meds  3. HTN  -accepting high bp's for him at this time due to prior dizziness - at goal, continue current meds  4. Bradycardia  - continueto follow in device clinic for his pacemaker - normal recent device check.   5. Hyperlipidemia -request pcp labs, continue statin  6. Afib - no symptoms, continue current meds including anticoag    F/u 6 months  Arnoldo Lenis, M.D.

## 2019-07-17 NOTE — Patient Instructions (Signed)
Medication Instructions:  Your physician recommends that you continue on your current medications as directed. Please refer to the Current Medication list given to you today.  You have been given samples of Eliquis  If you need a refill on your cardiac medications before your next appointment, please call your pharmacy.   Lab work: NONE   If you have labs (blood work) drawn today and your tests are completely normal, you will receive your results only by: Marland Kitchen MyChart Message (if you have MyChart) OR . A paper copy in the mail If you have any lab test that is abnormal or we need to change your treatment, we will call you to review the results.  Testing/Procedures: NONE   Follow-Up: At [facility], you and your health needs are our priority.  As part of our continuing mission to provide you with exceptional heart care, we have created designated Provider Care Teams.  These Care Teams include your primary Cardiologist (physician) and Advanced Practice Providers (APPs -  Physician Assistants and Nurse Practitioners) who all work together to provide you with the care you need, when you need it. You will need a follow up appointment in 6 months.  Please call our office 2 months in advance to schedule this appointment.  You may see No primary care provider on file. or one of the following Advanced Practice Providers on your designated Care Team:   [place], PA-C [facility]) . Ermalinda Barrios, PA-C ([place])  Any Other Special Instructions Will Be Listed Below (If Applicable). Thank you for choosing [name]!

## 2019-07-19 ENCOUNTER — Encounter: Payer: Self-pay | Admitting: Internal Medicine

## 2019-07-24 DIAGNOSIS — I10 Essential (primary) hypertension: Secondary | ICD-10-CM | POA: Diagnosis not present

## 2019-07-31 ENCOUNTER — Encounter: Payer: Self-pay | Admitting: Cardiology

## 2019-07-31 NOTE — Progress Notes (Signed)
Remote pacemaker transmission.   

## 2019-08-08 ENCOUNTER — Ambulatory Visit (INDEPENDENT_AMBULATORY_CARE_PROVIDER_SITE_OTHER): Payer: Medicare Other | Admitting: Otolaryngology

## 2019-08-08 ENCOUNTER — Other Ambulatory Visit: Payer: Self-pay

## 2019-08-08 DIAGNOSIS — H95122 Granulation of postmastoidectomy cavity, left ear: Secondary | ICD-10-CM | POA: Diagnosis not present

## 2019-08-08 DIAGNOSIS — D440 Neoplasm of uncertain behavior of thyroid gland: Secondary | ICD-10-CM | POA: Diagnosis not present

## 2019-08-08 DIAGNOSIS — H9209 Otalgia, unspecified ear: Secondary | ICD-10-CM

## 2019-08-14 ENCOUNTER — Encounter: Payer: Self-pay | Admitting: Cardiology

## 2019-08-14 ENCOUNTER — Telehealth: Payer: Self-pay | Admitting: Internal Medicine

## 2019-08-14 NOTE — Telephone Encounter (Signed)
error 

## 2019-08-14 NOTE — Telephone Encounter (Signed)
  Patient's wife is calling because patient has gone in the donut hole and needs assistance with his ELIQUIS 5 MG TABS tablet

## 2019-08-15 NOTE — Telephone Encounter (Signed)
Returned pt's wife's call. Advised her to pick up assistance paperwork. She states that she will stop bye tomorrow.

## 2019-08-26 DIAGNOSIS — I10 Essential (primary) hypertension: Secondary | ICD-10-CM | POA: Diagnosis not present

## 2019-09-16 DIAGNOSIS — N319 Neuromuscular dysfunction of bladder, unspecified: Secondary | ICD-10-CM | POA: Diagnosis not present

## 2019-09-16 DIAGNOSIS — R339 Retention of urine, unspecified: Secondary | ICD-10-CM | POA: Diagnosis not present

## 2019-09-20 DIAGNOSIS — I10 Essential (primary) hypertension: Secondary | ICD-10-CM | POA: Diagnosis not present

## 2019-10-01 DIAGNOSIS — Z23 Encounter for immunization: Secondary | ICD-10-CM | POA: Diagnosis not present

## 2019-10-11 DIAGNOSIS — Z6825 Body mass index (BMI) 25.0-25.9, adult: Secondary | ICD-10-CM | POA: Diagnosis not present

## 2019-10-11 DIAGNOSIS — Z299 Encounter for prophylactic measures, unspecified: Secondary | ICD-10-CM | POA: Diagnosis not present

## 2019-10-11 DIAGNOSIS — I10 Essential (primary) hypertension: Secondary | ICD-10-CM | POA: Diagnosis not present

## 2019-10-11 DIAGNOSIS — I429 Cardiomyopathy, unspecified: Secondary | ICD-10-CM | POA: Diagnosis not present

## 2019-10-11 DIAGNOSIS — I4891 Unspecified atrial fibrillation: Secondary | ICD-10-CM | POA: Diagnosis not present

## 2019-10-11 DIAGNOSIS — E1165 Type 2 diabetes mellitus with hyperglycemia: Secondary | ICD-10-CM | POA: Diagnosis not present

## 2019-10-11 DIAGNOSIS — C642 Malignant neoplasm of left kidney, except renal pelvis: Secondary | ICD-10-CM | POA: Diagnosis not present

## 2019-10-14 ENCOUNTER — Ambulatory Visit (INDEPENDENT_AMBULATORY_CARE_PROVIDER_SITE_OTHER): Payer: Medicare Other | Admitting: *Deleted

## 2019-10-14 DIAGNOSIS — I482 Chronic atrial fibrillation, unspecified: Secondary | ICD-10-CM

## 2019-10-14 DIAGNOSIS — I495 Sick sinus syndrome: Secondary | ICD-10-CM

## 2019-10-15 LAB — CUP PACEART REMOTE DEVICE CHECK
Battery Remaining Longevity: 118 mo
Battery Remaining Percentage: 95.5 %
Battery Voltage: 2.96 V
Brady Statistic RV Percent Paced: 44 %
Date Time Interrogation Session: 20201019085845
Implantable Lead Implant Date: 20140919
Implantable Lead Implant Date: 20140919
Implantable Lead Location: 753859
Implantable Lead Location: 753860
Implantable Pulse Generator Implant Date: 20140919
Lead Channel Impedance Value: 410 Ohm
Lead Channel Pacing Threshold Amplitude: 0.75 V
Lead Channel Pacing Threshold Pulse Width: 0.5 ms
Lead Channel Sensing Intrinsic Amplitude: 5.1 mV
Lead Channel Setting Pacing Amplitude: 2.5 V
Lead Channel Setting Pacing Pulse Width: 0.5 ms
Lead Channel Setting Sensing Sensitivity: 2 mV
Pulse Gen Model: 2240
Pulse Gen Serial Number: 7536055

## 2019-10-24 DIAGNOSIS — I10 Essential (primary) hypertension: Secondary | ICD-10-CM | POA: Diagnosis not present

## 2019-10-30 ENCOUNTER — Encounter (INDEPENDENT_AMBULATORY_CARE_PROVIDER_SITE_OTHER): Payer: Medicare Other | Admitting: Ophthalmology

## 2019-10-30 ENCOUNTER — Other Ambulatory Visit: Payer: Self-pay

## 2019-10-30 DIAGNOSIS — H43813 Vitreous degeneration, bilateral: Secondary | ICD-10-CM | POA: Diagnosis not present

## 2019-10-30 DIAGNOSIS — H34832 Tributary (branch) retinal vein occlusion, left eye, with macular edema: Secondary | ICD-10-CM | POA: Diagnosis not present

## 2019-10-30 DIAGNOSIS — H35033 Hypertensive retinopathy, bilateral: Secondary | ICD-10-CM | POA: Diagnosis not present

## 2019-10-30 DIAGNOSIS — I10 Essential (primary) hypertension: Secondary | ICD-10-CM

## 2019-10-30 DIAGNOSIS — H353112 Nonexudative age-related macular degeneration, right eye, intermediate dry stage: Secondary | ICD-10-CM

## 2019-11-04 NOTE — Progress Notes (Signed)
Remote pacemaker transmission.   

## 2019-11-14 DIAGNOSIS — Z961 Presence of intraocular lens: Secondary | ICD-10-CM | POA: Diagnosis not present

## 2019-11-14 DIAGNOSIS — H40011 Open angle with borderline findings, low risk, right eye: Secondary | ICD-10-CM | POA: Diagnosis not present

## 2019-11-14 DIAGNOSIS — H0102B Squamous blepharitis left eye, upper and lower eyelids: Secondary | ICD-10-CM | POA: Diagnosis not present

## 2019-11-14 DIAGNOSIS — E119 Type 2 diabetes mellitus without complications: Secondary | ICD-10-CM | POA: Diagnosis not present

## 2019-11-14 DIAGNOSIS — H0102A Squamous blepharitis right eye, upper and lower eyelids: Secondary | ICD-10-CM | POA: Diagnosis not present

## 2019-11-14 DIAGNOSIS — H401123 Primary open-angle glaucoma, left eye, severe stage: Secondary | ICD-10-CM | POA: Diagnosis not present

## 2019-11-25 ENCOUNTER — Telehealth: Payer: Self-pay | Admitting: *Deleted

## 2019-11-25 NOTE — Telephone Encounter (Signed)
Pt notified that he was approved for patient assistance and that his Eliquis as arrived in the office.

## 2019-12-05 ENCOUNTER — Ambulatory Visit (INDEPENDENT_AMBULATORY_CARE_PROVIDER_SITE_OTHER): Payer: Medicare Other | Admitting: Otolaryngology

## 2019-12-06 ENCOUNTER — Other Ambulatory Visit: Payer: Self-pay | Admitting: Cardiology

## 2019-12-24 DIAGNOSIS — Z1339 Encounter for screening examination for other mental health and behavioral disorders: Secondary | ICD-10-CM | POA: Diagnosis not present

## 2019-12-24 DIAGNOSIS — R5383 Other fatigue: Secondary | ICD-10-CM | POA: Diagnosis not present

## 2019-12-24 DIAGNOSIS — M545 Low back pain: Secondary | ICD-10-CM | POA: Diagnosis not present

## 2019-12-24 DIAGNOSIS — Z6825 Body mass index (BMI) 25.0-25.9, adult: Secondary | ICD-10-CM | POA: Diagnosis not present

## 2019-12-24 DIAGNOSIS — E7800 Pure hypercholesterolemia, unspecified: Secondary | ICD-10-CM | POA: Diagnosis not present

## 2019-12-24 DIAGNOSIS — I10 Essential (primary) hypertension: Secondary | ICD-10-CM | POA: Diagnosis not present

## 2019-12-24 DIAGNOSIS — Z0000 Encounter for general adult medical examination without abnormal findings: Secondary | ICD-10-CM | POA: Diagnosis not present

## 2019-12-24 DIAGNOSIS — Z299 Encounter for prophylactic measures, unspecified: Secondary | ICD-10-CM | POA: Diagnosis not present

## 2019-12-24 DIAGNOSIS — Z1331 Encounter for screening for depression: Secondary | ICD-10-CM | POA: Diagnosis not present

## 2019-12-24 DIAGNOSIS — E039 Hypothyroidism, unspecified: Secondary | ICD-10-CM | POA: Diagnosis not present

## 2019-12-24 DIAGNOSIS — Z1211 Encounter for screening for malignant neoplasm of colon: Secondary | ICD-10-CM | POA: Diagnosis not present

## 2019-12-24 DIAGNOSIS — Z7189 Other specified counseling: Secondary | ICD-10-CM | POA: Diagnosis not present

## 2020-01-09 ENCOUNTER — Ambulatory Visit: Payer: Medicare Other | Admitting: Cardiology

## 2020-01-13 ENCOUNTER — Ambulatory Visit (INDEPENDENT_AMBULATORY_CARE_PROVIDER_SITE_OTHER): Payer: Medicare Other | Admitting: *Deleted

## 2020-01-13 DIAGNOSIS — Z950 Presence of cardiac pacemaker: Secondary | ICD-10-CM | POA: Diagnosis not present

## 2020-01-13 LAB — CUP PACEART REMOTE DEVICE CHECK
Battery Remaining Longevity: 116 mo
Battery Remaining Percentage: 95.5 %
Battery Voltage: 2.96 V
Brady Statistic RV Percent Paced: 44 %
Date Time Interrogation Session: 20210118063940
Implantable Lead Implant Date: 20140919
Implantable Lead Implant Date: 20140919
Implantable Lead Location: 753859
Implantable Lead Location: 753860
Implantable Pulse Generator Implant Date: 20140919
Lead Channel Impedance Value: 400 Ohm
Lead Channel Pacing Threshold Amplitude: 0.75 V
Lead Channel Pacing Threshold Pulse Width: 0.5 ms
Lead Channel Sensing Intrinsic Amplitude: 5.1 mV
Lead Channel Setting Pacing Amplitude: 2.5 V
Lead Channel Setting Pacing Pulse Width: 0.5 ms
Lead Channel Setting Sensing Sensitivity: 2 mV
Pulse Gen Model: 2240
Pulse Gen Serial Number: 7536055

## 2020-01-14 ENCOUNTER — Encounter: Payer: Self-pay | Admitting: Cardiology

## 2020-01-14 ENCOUNTER — Ambulatory Visit (INDEPENDENT_AMBULATORY_CARE_PROVIDER_SITE_OTHER): Payer: Medicare Other | Admitting: Cardiology

## 2020-01-14 VITALS — BP 171/95 | HR 63 | Temp 97.6°F | Ht 72.0 in | Wt 179.0 lb

## 2020-01-14 DIAGNOSIS — I482 Chronic atrial fibrillation, unspecified: Secondary | ICD-10-CM | POA: Diagnosis not present

## 2020-01-14 DIAGNOSIS — Z950 Presence of cardiac pacemaker: Secondary | ICD-10-CM | POA: Diagnosis not present

## 2020-01-14 DIAGNOSIS — I2510 Atherosclerotic heart disease of native coronary artery without angina pectoris: Secondary | ICD-10-CM | POA: Diagnosis not present

## 2020-01-14 DIAGNOSIS — Z8679 Personal history of other diseases of the circulatory system: Secondary | ICD-10-CM | POA: Diagnosis not present

## 2020-01-14 DIAGNOSIS — E782 Mixed hyperlipidemia: Secondary | ICD-10-CM | POA: Diagnosis not present

## 2020-01-14 DIAGNOSIS — I10 Essential (primary) hypertension: Secondary | ICD-10-CM | POA: Diagnosis not present

## 2020-01-14 NOTE — Patient Instructions (Signed)
Medication Instructions:  .[email or web address]   Labwork: I will request labs from pcp   Testing/Procedures: none  Follow-Up: Your physician wants you to follow-up in: 6 months.  You will receive a reminder letter in the mail two months in advance. If you don't receive a letter, please call our office to schedule the follow-up appointment.   Any Other Special Instructions Will Be Listed Below (If Applicable).     If you need a refill on your cardiac medications before your next appointment, please call your pharmacy.

## 2020-01-14 NOTE — Progress Notes (Signed)
Clinical Summary [name] is a 86 y.o.male seen today for follow up of the following medical problems.   1. Mild systolic dysfunction  - echo 07/2012 w/ LVEF 40-45%  - Cath 08/2013 w/ minimal CAD, LV gram 60% with normal filling pressures on RHC.     - no recent SOB. Weights are stable. Stable LE edema unchanged.   2. HTN  - accepting higher bp's due to prior dizziness -enalapril dose lowered recently by pcp due to some SBPs in the 90s - compliant withmeds  3. Bradycardia  -s/p St Jude dual chamber pacemaker  - followed by EP  - Jan 2021 normal device check.  - no recent symptoms  4. Hyperlipidemia - he is compliant with statin  5. Afib -denies any recent palpitations.   6. CAD  - patent major arteries with evidence of disease in diags and OMs fromprior cath  -no recent chest pain symptoms, no SOB or DOE  SH: wife on chemo and radiationalong with recent hysterectomy, endometrialcancer.Just completed treatments, told she is cancer free.   Past Medical History:  Diagnosis Date  . Allergic rhinitis   . Anemia   . Anxiety   . Arthritis    "all over my body" (09/10/2013)  . BPH (benign prostatic hypertrophy)   . CAD (coronary artery disease)    a. 08/2013 LM nl, LAD 3m, D1 80ost, 95p, LCX 68m, RCA 20d, EF 60%.  . Chronic lower back pain   . Depression   . Diabetes mellitus    "diet controlled" (09/10/2013)  . Diaphragmatic hernia without mention of obstruction or gangrene   . Diastolic congestive heart failure ([place])   . Elevated liver function tests   . Essential hypertension, benign   . Lacunar infarction ([place])    a. 08/2013 post-cath, MRI: sm acute lacunar infarcts in LPCA and LSCA, ? tiny RPICA lacunar infarct.  . Obesity   . OSA on CPAP    "wears mask part of the time" (09/10/2013)  . Osteoporosis   . Sinus node dysfunction ([place])    a. 08/2013 s/p SJM DC PPM.     Allergies  Allergen Reactions  . Fentanyl Other (See  Comments)    resp suppression per wife, out for 2 days      Current Outpatient Medications  Medication Sig Dispense Refill  . acetaminophen (TYLENOL) 500 MG tablet Take 500 mg by mouth every 6 (six) hours as needed for pain.    Marland Kitchen alendronate (FOSAMAX) 70 MG tablet Take 70 mg by mouth every 7 (seven) days. Take with a full glass of water on an empty stomach. On Mondays    . amLODipine (NORVASC) 5 MG tablet Take 1 tablet by mouth daily.    Marland Kitchen apixaban (ELIQUIS) 5 MG TABS tablet Take 1 tablet (5 mg total) by mouth 2 (two) times daily. 180 tablet 3  . Calcium Carbonate-Vitamin D (CALCIUM 600 + D PO) Take 600 mg by mouth 2 (two) times daily.    . dorzolamide-timolol (COSOPT) 22.3-6.8 MG/ML ophthalmic solution USE ONE DROP IN LEFT EYE TWICE DAILY    . ELIQUIS 5 MG TABS tablet TAKE 1 TABLET BY MOUTH TWICE DAILY 60 tablet 11  . enalapril (VASOTEC) 20 MG tablet Take 20 mg by mouth daily.    . fish oil-omega-3 fatty acids 1000 MG capsule Take 1 g by mouth 2 (two) times daily.      . hydrochlorothiazide (HYDRODIURIL) 25 MG tablet TAKE 1 TABLET BY MOUTH DAILY 90  tablet 3  . isosorbide mononitrate (IMDUR) 30 MG 24 hr tablet Take 30 mg by mouth daily.    . metFORMIN (GLUCOPHAGE) 500 MG tablet Take 500 mg by mouth daily with breakfast.    . Multiple Vitamin (MULTIVITAMIN WITH MINERALS) TABS tablet Take 1 tablet by mouth daily.    . nitroGLYCERIN (NITROSTAT) 0.4 MG SL tablet Place 1 tablet (0.4 mg total) under the tongue every 5 (five) minutes x 3 doses as needed for chest pain. 25 tablet 3  . potassium chloride SA (K-DUR) 20 MEQ tablet TAKE 1 TABLET EVERY DAY 90 tablet 3  . pravastatin (PRAVACHOL) 20 MG tablet TAKE 1 TABLET (20 MG TOTAL) BY MOUTH EVERY EVENING. 90 tablet 3  . tamsulosin (FLOMAX) 0.4 MG CAPS capsule Take 0.4 mg by mouth daily.     . traMADol (ULTRAM) 50 MG tablet Take 50 mg by mouth every 6 (six) hours as needed.      No current facility-administered medications for this visit.     Past  Surgical History:  Procedure Laterality Date  . CARDIAC CATHETERIZATION  1998  . CATARACT EXTRACTION W/ INTRAOCULAR LENS  IMPLANT, BILATERAL  2010  . LEFT AND RIGHT HEART CATHETERIZATION WITH CORONARY ANGIOGRAM N/A 09/11/2013   Procedure: LEFT AND RIGHT HEART CATHETERIZATION WITH CORONARY ANGIOGRAM;  Surgeon: Wellington Hampshire, MD;  Location: [facility] CATH LAB;  Service: Cardiovascular;  Laterality: N/A;  . PERMANENT PACEMAKER INSERTION N/A 09/13/2013   Procedure: PERMANENT PACEMAKER INSERTION;  Surgeon: Evans Lance, MD;  Location: [facility] CATH LAB;  Service: Cardiovascular;  Laterality: N/A;  . right hip fx  05/26/2016  . right wrist fx  05/26/2016  . STOMACH SURGERY  03/02/2012   ?volvus; "stomach came loose and twisted; went up under rib cage; had that corrected; tacked it up" (09/10/2013)     Allergies  Allergen Reactions  . Fentanyl Other (See Comments)    resp suppression per wife, out for 2 days       Family History  Problem Relation Age of Onset  . Hypertension Father   . Hypertension Mother      Social History [name] reports that he has never smoked. He has never used smokeless tobacco. [name] reports no history of alcohol use.   Review of Systems CONSTITUTIONAL: No weight loss, fever, chills, weakness or fatigue.  HEENT: Eyes: No visual loss, blurred vision, double vision or yellow sclerae.No hearing loss, sneezing, congestion, runny nose or sore throat.  SKIN: No rash or itching.  CARDIOVASCULAR: per hpi RESPIRATORY: No shortness of breath, cough or sputum.  GASTROINTESTINAL: No anorexia, nausea, vomiting or diarrhea. No abdominal pain or blood.  GENITOURINARY: No burning on urination, no polyuria NEUROLOGICAL: No headache, dizziness, syncope, paralysis, ataxia, numbness or tingling in the extremities. No change in bowel or bladder control.  MUSCULOSKELETAL: No muscle, back pain, joint pain or stiffness.  LYMPHATICS: No enlarged nodes. No history of splenectomy.    PSYCHIATRIC: No history of depression or anxiety.  ENDOCRINOLOGIC: No reports of sweating, cold or heat intolerance. No polyuria or polydipsia.  Marland Kitchen   Physical Examination Vitals:   01/14/20 1057  BP: (!) 171/95  Pulse: 63  Temp: 97.6 F (36.4 C)  SpO2: 99%   Filed Weights   01/14/20 1057  Weight: 179 lb (81.2 kg)    Gen: resting comfortably, no acute distress HEENT: no scleral icterus, pupils equal round and reactive, no palptable cervical adenopathy,  CV: RRR, no m/r/g, no jvd Resp: Clear to auscultation bilaterally  GI: abdomen is soft, non-tender, non-distended, normal bowel sounds, no hepatosplenomegaly MSK: extremities are warm, no edema.  Skin: warm, no rash Neuro:  no focal deficits Psych: appropriate affect   Diagnostic Studies  Cardiac Catheterization 2013-09-22 Procedural Findings:  Hemodynamics RA 3 mmHg  RV 37/1 mmHg  PA 33/12 mmHg  PCWP 6 mmHg  LV 201/4 mHg . LVEDP: 12 mmHg  AO 202/94 mmHg  Oxygen saturations:  PA 74%  AO 94%  Cardiac Output (Fick) 6.72  Cardiac Index (Fick) 3.22  Pulmonary vascular resistance (PVR): 1.9 Woods units.  Coronary angiography: Coronary dominance: right   Left Main: Normal in size with no significant disease.   Left Anterior Descending (LAD): Large in size with mild diffuse atherosclerosis. There is diffuse 20% disease in the midsegment.   1st diagonal (D1): Normal in size with 80% ostial stenosis followed by a 95% proximal stenosis.   2nd diagonal (D2): Medium in size with minor irregularities.   3rd diagonal (D3): Normal in size with no significant disease.   Circumflex (LCx): Normal in size and nondominant. There is 20% mid stenosis.   1st obtuse marginal: Medium in size with minor irregularities.   2nd obtuse marginal: Large is in size with no significant disease.   3rd obtuse marginal: Normal in size with minor irregularities.   Right Coronary Artery: large in size and  dominant. There is 20% stenosis distally.   Posterior descending artery: normal in size with no significant disease.   Posterior AV segment: normal in size with no significant disease.   Posterolateral branchs: PL 1 is large with no significant disease. PL 2 is small. Left ventriculography: Left ventricular systolic function is normal , LVEF is estimated at 60 %, there is no significant mitral regurgitation   07/2012 Echo: mild LVH, LVEF 40-45%, LAE, mild MR,    Assessment and Plan  1. History of cardiomyopathy - LVEF has since normalized, still with some diastolic dysfunction - chronic LE edema without other cardiopulmonary symptoms, he has not been intersted in more aggressive diuretic dosing.   - no significant symptoms, continue current meds  2. CAD  - patent major arteries with evidence of disease in diags and OMs fromprior cath - no recent symptoms, continue to monitor  3. HTN  -accepting high bp's for him at this time due to prior dizziness - manual recheck 150/70, continue current meds  4. Bradycardia  - no recent symptoms, continue to monitor and follow in pacemaker clinic - EKG today shows paced rhythm   5. Hyperlipidemia -continue statin, request pcp labs   6. Afib - no recent symptoms, continue current meds   F/u 6 months      Arnoldo Lenis, M.D.

## 2020-01-14 NOTE — Progress Notes (Signed)
PPM Remote  

## 2020-01-17 DIAGNOSIS — Z6825 Body mass index (BMI) 25.0-25.9, adult: Secondary | ICD-10-CM | POA: Diagnosis not present

## 2020-01-17 DIAGNOSIS — E1165 Type 2 diabetes mellitus with hyperglycemia: Secondary | ICD-10-CM | POA: Diagnosis not present

## 2020-01-17 DIAGNOSIS — I10 Essential (primary) hypertension: Secondary | ICD-10-CM | POA: Diagnosis not present

## 2020-01-17 DIAGNOSIS — Z299 Encounter for prophylactic measures, unspecified: Secondary | ICD-10-CM | POA: Diagnosis not present

## 2020-01-17 DIAGNOSIS — I429 Cardiomyopathy, unspecified: Secondary | ICD-10-CM | POA: Diagnosis not present

## 2020-01-17 DIAGNOSIS — I4891 Unspecified atrial fibrillation: Secondary | ICD-10-CM | POA: Diagnosis not present

## 2020-01-17 DIAGNOSIS — D692 Other nonthrombocytopenic purpura: Secondary | ICD-10-CM | POA: Diagnosis not present

## 2020-01-24 DIAGNOSIS — I10 Essential (primary) hypertension: Secondary | ICD-10-CM | POA: Diagnosis not present

## 2020-02-06 DIAGNOSIS — Z23 Encounter for immunization: Secondary | ICD-10-CM | POA: Diagnosis not present

## 2020-02-19 ENCOUNTER — Other Ambulatory Visit: Payer: Self-pay

## 2020-02-19 ENCOUNTER — Ambulatory Visit (INDEPENDENT_AMBULATORY_CARE_PROVIDER_SITE_OTHER): Payer: Medicare Other | Admitting: Internal Medicine

## 2020-02-19 ENCOUNTER — Encounter: Payer: Self-pay | Admitting: Internal Medicine

## 2020-02-19 VITALS — BP 168/94 | HR 81 | Temp 97.5°F | Ht 72.0 in | Wt 181.0 lb

## 2020-02-19 DIAGNOSIS — I10 Essential (primary) hypertension: Secondary | ICD-10-CM | POA: Diagnosis not present

## 2020-02-19 DIAGNOSIS — I482 Chronic atrial fibrillation, unspecified: Secondary | ICD-10-CM | POA: Diagnosis not present

## 2020-02-19 DIAGNOSIS — I2510 Atherosclerotic heart disease of native coronary artery without angina pectoris: Secondary | ICD-10-CM

## 2020-02-19 LAB — CUP PACEART INCLINIC DEVICE CHECK
Brady Statistic RV Percent Paced: 45 %
Date Time Interrogation Session: 20210224111041
Implantable Lead Implant Date: 20140919
Implantable Lead Implant Date: 20140919
Implantable Lead Location: 753859
Implantable Lead Location: 753860
Implantable Pulse Generator Implant Date: 20140919
Lead Channel Pacing Threshold Amplitude: 0.75 V
Lead Channel Pacing Threshold Pulse Width: 0.5 ms
Lead Channel Sensing Intrinsic Amplitude: 5.7 mV
Pulse Gen Model: 2240
Pulse Gen Serial Number: 7536055

## 2020-02-19 NOTE — Patient Instructions (Signed)
Medication Instructions:  Your physician recommends that you continue on your current medications as directed. Please refer to the Current Medication list given to you today.  *If you need a refill on your cardiac medications before your next appointment, please call your pharmacy*  Lab Work: NONE  If you have labs (blood work) drawn today and your tests are completely normal, you will receive your results only by: . MyChart Message (if you have MyChart) OR . A paper copy in the mail If you have any lab test that is abnormal or we need to change your treatment, we will call you to review the results.  Testing/Procedures: NONE   Follow-Up: At [facility], you and your health needs are our priority.  As part of our continuing mission to provide you with exceptional heart care, we have created designated Provider Care Teams.  These Care Teams include your primary Cardiologist (physician) and Advanced Practice Providers (APPs -  Physician Assistants and Nurse Practitioners) who all work together to provide you with the care you need, when you need it.  Your next appointment:   1 year(s)  The format for your next appointment:   In Person  Provider:   Gregg Taylor, MD  Other Instructions Thank you for choosing [facility]!    

## 2020-02-19 NOTE — Progress Notes (Signed)
HPI [name] returns today for follow-up of his atrial fibrillation and symptomatic bradycardia. He is an 86 year old man with a history of paroxysmal atrial fibrillation,symptomatic bradycardia, status post pacemaker insertion, hypertension, and chronic peripheral edema. He has been sedentary in the past secondary to chronic back pain.his blood pressure has not been well-controlled. He has been in atrial fib which is now chronic. He has been on Eliquis. His rates are well controlled. He has fallen a couple of times but did not injure himself.  Allergies  Allergen Reactions  . Fentanyl Other (See Comments)    resp suppression per wife, out for 2 days      Current Outpatient Medications  Medication Sig Dispense Refill  . acetaminophen (TYLENOL) 500 MG tablet Take 500 mg by mouth every 6 (six) hours as needed for pain.    Marland Kitchen alendronate (FOSAMAX) 70 MG tablet Take 70 mg by mouth every 7 (seven) days. Take with a full glass of water on an empty stomach. On Mondays    . amLODipine (NORVASC) 5 MG tablet Take 1 tablet by mouth daily.    Marland Kitchen apixaban (ELIQUIS) 5 MG TABS tablet Take 1 tablet (5 mg total) by mouth 2 (two) times daily. 180 tablet 3  . Calcium Carbonate-Vitamin D (CALCIUM 600 + D PO) Take 600 mg by mouth 2 (two) times daily.    . dorzolamide-timolol (COSOPT) 22.3-6.8 MG/ML ophthalmic solution USE ONE DROP IN LEFT EYE TWICE DAILY    . ELIQUIS 5 MG TABS tablet TAKE 1 TABLET BY MOUTH TWICE DAILY 60 tablet 11  . enalapril (VASOTEC) 20 MG tablet Take 20 mg by mouth daily.    . fish oil-omega-3 fatty acids 1000 MG capsule Take 1 g by mouth 2 (two) times daily.      . hydrochlorothiazide (HYDRODIURIL) 25 MG tablet TAKE 1 TABLET BY MOUTH DAILY 90 tablet 3  . isosorbide mononitrate (IMDUR) 30 MG 24 hr tablet Take 30 mg by mouth daily.    . metFORMIN (GLUCOPHAGE) 500 MG tablet Take 500 mg by mouth daily with breakfast.    . Multiple Vitamin (MULTIVITAMIN WITH MINERALS) TABS tablet Take 1  tablet by mouth daily.    . nitroGLYCERIN (NITROSTAT) 0.4 MG SL tablet Place 1 tablet (0.4 mg total) under the tongue every 5 (five) minutes x 3 doses as needed for chest pain. 25 tablet 3  . potassium chloride SA (K-DUR) 20 MEQ tablet TAKE 1 TABLET EVERY DAY 90 tablet 3  . pravastatin (PRAVACHOL) 20 MG tablet TAKE 1 TABLET (20 MG TOTAL) BY MOUTH EVERY EVENING. 90 tablet 3  . tamsulosin (FLOMAX) 0.4 MG CAPS capsule Take 0.4 mg by mouth daily.     . traMADol (ULTRAM) 50 MG tablet Take 50 mg by mouth every 6 (six) hours as needed.      No current facility-administered medications for this visit.     Past Medical History:  Diagnosis Date  . Allergic rhinitis   . Anemia   . Anxiety   . Arthritis    "all over my body" (09/10/2013)  . BPH (benign prostatic hypertrophy)   . CAD (coronary artery disease)    a. 08/2013 LM nl, LAD 34m, D1 80ost, 95p, LCX 53m, RCA 20d, EF 60%.  . Chronic lower back pain   . Depression   . Diabetes mellitus    "diet controlled" (09/10/2013)  . Diaphragmatic hernia without mention of obstruction or gangrene   . Diastolic congestive heart failure ([place])   .  Elevated liver function tests   . Essential hypertension, benign   . Lacunar infarction ([place])    a. 08/2013 post-cath, MRI: sm acute lacunar infarcts in LPCA and LSCA, ? tiny RPICA lacunar infarct.  . Obesity   . OSA on CPAP    "wears mask part of the time" (09/10/2013)  . Osteoporosis   . Sinus node dysfunction ([place])    a. 08/2013 s/p SJM DC PPM.    ROS:   All systems reviewed and negative except as noted in the HPI.   Past Surgical History:  Procedure Laterality Date  . CARDIAC CATHETERIZATION  1998  . CATARACT EXTRACTION W/ INTRAOCULAR LENS  IMPLANT, BILATERAL  2010  . LEFT AND RIGHT HEART CATHETERIZATION WITH CORONARY ANGIOGRAM N/A 09/11/2013   Procedure: LEFT AND RIGHT HEART CATHETERIZATION WITH CORONARY ANGIOGRAM;  Surgeon: Wellington Hampshire, MD;  Location: [facility] CATH LAB;  Service: Cardiovascular;   Laterality: N/A;  . PERMANENT PACEMAKER INSERTION N/A 09/13/2013   Procedure: PERMANENT PACEMAKER INSERTION;  Surgeon: Evans Lance, MD;  Location: [facility] CATH LAB;  Service: Cardiovascular;  Laterality: N/A;  . right hip fx  05/26/2016  . right wrist fx  05/26/2016  . STOMACH SURGERY  03/02/2012   ?volvus; "stomach came loose and twisted; went up under rib cage; had that corrected; tacked it up" (09/10/2013)     Family History  Problem Relation Age of Onset  . Hypertension Father   . Hypertension Mother      Social History   Socioeconomic History  . Marital status: Married    Spouse name: Not on file  . Number of children: 2  . Years of education: Not on file  . Highest education level: Not on file  Occupational History  . Occupation: Tax inspector FOR [employer]    Comment: WORKED IN [employer]  Tobacco Use  . Smoking status: Never Smoker  . Smokeless tobacco: Never Used  Substance and Sexual Activity  . Alcohol use: No    Alcohol/week: 0.0 standard drinks  . Drug use: No  . Sexual activity: Never  Other Topics Concern  . Not on file  Social History Narrative  . Not on file   Social Determinants of Health   Financial Resource Strain:   . Difficulty of Paying Living Expenses: Not on file  Food Insecurity:   . Worried About Charity fundraiser in the Last Year: Not on file  . Ran Out of Food in the Last Year: Not on file  Transportation Needs:   . Lack of Transportation (Medical): Not on file  . Lack of Transportation (Non-Medical): Not on file  Physical Activity:   . Days of Exercise per Week: Not on file  . Minutes of Exercise per Session: Not on file  Stress:   . Feeling of Stress : Not on file  Social Connections:   . Frequency of Communication with Friends and Family: Not on file  . Frequency of Social Gatherings with Friends and Family: Not on file  . Attends Religious Services: Not on file  . Active Member of Clubs or Organizations: Not on file  .  Attends Archivist Meetings: Not on file  . Marital Status: Not on file  Intimate Partner Violence:   . Fear of Current or Ex-Partner: Not on file  . Emotionally Abused: Not on file  . Physically Abused: Not on file  . Sexually Abused: Not on file     BP (!) 168/94   Pulse 81  Temp (!) 97.5 F (36.4 C)   Ht 6' (1.829 m)   Wt 181 lb (82.1 kg)   BMI 24.55 kg/m   Physical Exam:  Well appearing NAD HEENT: Unremarkable Neck:  No JVD, no thyromegally Lymphatics:  No adenopathy Back:  No CVA tenderness Lungs:  Clear with no wheezes HEART:  Regular rate rhythm, no murmurs, no rubs, no clicks Abd:  soft, positive bowel sounds, no organomegally, no rebound, no guarding Ext:  2 plus pulses, no edema, no cyanosis, no clubbing Skin:  No rashes no nodules Neuro:  CN II through XII intact, motor grossly intact  DEVICE  Normal device function.  See PaceArt for details.   Assess/Plan: 1. Persistent atrial fib - his rate is controlled. He will continue his Eliquis.  2. Falls - I am concerned that he might injure himself but has not yet. If he continues then we willhave to stop eliquis. 3. HTN - his bp is not well controlled. However, I am not inclined to try and increase his meds out of concerns for his falling. 4. PPM - His St. Jude VVI PM is working normally. He is pacing about 45% of the time.  Mikle [email or web address].D.

## 2020-02-23 DIAGNOSIS — I10 Essential (primary) hypertension: Secondary | ICD-10-CM | POA: Diagnosis not present

## 2020-03-06 DIAGNOSIS — Z23 Encounter for immunization: Secondary | ICD-10-CM | POA: Diagnosis not present

## 2020-03-11 ENCOUNTER — Encounter (INDEPENDENT_AMBULATORY_CARE_PROVIDER_SITE_OTHER): Payer: Medicare Other | Admitting: Ophthalmology

## 2020-03-11 ENCOUNTER — Other Ambulatory Visit: Payer: Self-pay

## 2020-03-11 DIAGNOSIS — H35033 Hypertensive retinopathy, bilateral: Secondary | ICD-10-CM

## 2020-03-11 DIAGNOSIS — H34832 Tributary (branch) retinal vein occlusion, left eye, with macular edema: Secondary | ICD-10-CM | POA: Diagnosis not present

## 2020-03-11 DIAGNOSIS — H353112 Nonexudative age-related macular degeneration, right eye, intermediate dry stage: Secondary | ICD-10-CM | POA: Diagnosis not present

## 2020-03-11 DIAGNOSIS — I10 Essential (primary) hypertension: Secondary | ICD-10-CM | POA: Diagnosis not present

## 2020-03-11 DIAGNOSIS — H43813 Vitreous degeneration, bilateral: Secondary | ICD-10-CM | POA: Diagnosis not present

## 2020-03-16 ENCOUNTER — Other Ambulatory Visit: Payer: Self-pay | Admitting: Cardiology

## 2020-03-16 DIAGNOSIS — R350 Frequency of micturition: Secondary | ICD-10-CM | POA: Diagnosis not present

## 2020-03-24 DIAGNOSIS — I10 Essential (primary) hypertension: Secondary | ICD-10-CM | POA: Diagnosis not present

## 2020-04-13 ENCOUNTER — Ambulatory Visit (INDEPENDENT_AMBULATORY_CARE_PROVIDER_SITE_OTHER): Payer: Medicare Other | Admitting: *Deleted

## 2020-04-13 DIAGNOSIS — Z950 Presence of cardiac pacemaker: Secondary | ICD-10-CM

## 2020-04-13 LAB — CUP PACEART REMOTE DEVICE CHECK
Battery Remaining Longevity: 106 mo
Battery Remaining Percentage: 89 %
Battery Voltage: 2.95 V
Brady Statistic RV Percent Paced: 53 %
Date Time Interrogation Session: 20210419062041
Implantable Lead Implant Date: 20140919
Implantable Lead Implant Date: 20140919
Implantable Lead Location: 753859
Implantable Lead Location: 753860
Implantable Pulse Generator Implant Date: 20140919
Lead Channel Impedance Value: 400 Ohm
Lead Channel Pacing Threshold Amplitude: 0.75 V
Lead Channel Pacing Threshold Pulse Width: 0.5 ms
Lead Channel Sensing Intrinsic Amplitude: 4.9 mV
Lead Channel Setting Pacing Amplitude: 2.5 V
Lead Channel Setting Pacing Pulse Width: 0.5 ms
Lead Channel Setting Sensing Sensitivity: 2 mV
Pulse Gen Model: 2240
Pulse Gen Serial Number: 7536055

## 2020-04-14 NOTE — Progress Notes (Signed)
PPM Remote  

## 2020-04-17 ENCOUNTER — Telehealth: Payer: Self-pay

## 2020-04-17 NOTE — Telephone Encounter (Signed)
Pt requesting eliquis samples   Thanks renee

## 2020-04-17 NOTE — Telephone Encounter (Signed)
Samples placed in front office

## 2020-04-23 DIAGNOSIS — I429 Cardiomyopathy, unspecified: Secondary | ICD-10-CM | POA: Diagnosis not present

## 2020-04-23 DIAGNOSIS — Z299 Encounter for prophylactic measures, unspecified: Secondary | ICD-10-CM | POA: Diagnosis not present

## 2020-04-23 DIAGNOSIS — I4891 Unspecified atrial fibrillation: Secondary | ICD-10-CM | POA: Diagnosis not present

## 2020-04-23 DIAGNOSIS — I10 Essential (primary) hypertension: Secondary | ICD-10-CM | POA: Diagnosis not present

## 2020-04-23 DIAGNOSIS — E1165 Type 2 diabetes mellitus with hyperglycemia: Secondary | ICD-10-CM | POA: Diagnosis not present

## 2020-04-23 DIAGNOSIS — E7800 Pure hypercholesterolemia, unspecified: Secondary | ICD-10-CM | POA: Diagnosis not present

## 2020-04-24 DIAGNOSIS — I10 Essential (primary) hypertension: Secondary | ICD-10-CM | POA: Diagnosis not present

## 2020-04-29 ENCOUNTER — Other Ambulatory Visit: Payer: Self-pay | Admitting: Otolaryngology

## 2020-04-29 ENCOUNTER — Other Ambulatory Visit (HOSPITAL_COMMUNITY): Payer: Self-pay | Admitting: Otolaryngology

## 2020-04-29 DIAGNOSIS — E041 Nontoxic single thyroid nodule: Secondary | ICD-10-CM

## 2020-05-05 ENCOUNTER — Other Ambulatory Visit: Payer: Self-pay

## 2020-05-05 ENCOUNTER — Ambulatory Visit (HOSPITAL_COMMUNITY)
Admission: RE | Admit: 2020-05-05 | Discharge: 2020-05-05 | Disposition: A | Discharge status: Home / Self Care | Payer: Medicare Other | Source: Ambulatory Visit | Attending: Otolaryngology | Admitting: Otolaryngology

## 2020-05-05 DIAGNOSIS — E041 Nontoxic single thyroid nodule: Secondary | ICD-10-CM | POA: Diagnosis not present

## 2020-05-21 ENCOUNTER — Other Ambulatory Visit: Payer: Self-pay | Admitting: Cardiology

## 2020-05-24 DIAGNOSIS — I10 Essential (primary) hypertension: Secondary | ICD-10-CM | POA: Diagnosis not present

## 2020-06-03 ENCOUNTER — Other Ambulatory Visit: Payer: Self-pay | Admitting: Cardiology

## 2020-06-03 DIAGNOSIS — E876 Hypokalemia: Secondary | ICD-10-CM

## 2020-06-24 DIAGNOSIS — I10 Essential (primary) hypertension: Secondary | ICD-10-CM | POA: Diagnosis not present

## 2020-07-13 ENCOUNTER — Other Ambulatory Visit: Payer: Self-pay | Admitting: Cardiology

## 2020-07-13 ENCOUNTER — Ambulatory Visit (INDEPENDENT_AMBULATORY_CARE_PROVIDER_SITE_OTHER): Payer: Medicare Other | Admitting: *Deleted

## 2020-07-13 DIAGNOSIS — I495 Sick sinus syndrome: Secondary | ICD-10-CM | POA: Diagnosis not present

## 2020-07-13 DIAGNOSIS — E876 Hypokalemia: Secondary | ICD-10-CM

## 2020-07-14 LAB — CUP PACEART REMOTE DEVICE CHECK
Battery Remaining Longevity: 106 mo
Battery Remaining Percentage: 89 %
Battery Voltage: 2.95 V
Brady Statistic RV Percent Paced: 56 %
Date Time Interrogation Session: 20210719101150
Implantable Lead Implant Date: 20140919
Implantable Lead Implant Date: 20140919
Implantable Lead Location: 753859
Implantable Lead Location: 753860
Implantable Pulse Generator Implant Date: 20140919
Lead Channel Impedance Value: 400 Ohm
Lead Channel Pacing Threshold Amplitude: 0.75 V
Lead Channel Pacing Threshold Pulse Width: 0.5 ms
Lead Channel Sensing Intrinsic Amplitude: 5 mV
Lead Channel Setting Pacing Amplitude: 2.5 V
Lead Channel Setting Pacing Pulse Width: 0.5 ms
Lead Channel Setting Sensing Sensitivity: 2 mV
Pulse Gen Model: 2240
Pulse Gen Serial Number: 7536055

## 2020-07-15 DIAGNOSIS — K5900 Constipation, unspecified: Secondary | ICD-10-CM | POA: Diagnosis not present

## 2020-07-15 DIAGNOSIS — C642 Malignant neoplasm of left kidney, except renal pelvis: Secondary | ICD-10-CM | POA: Diagnosis not present

## 2020-07-15 DIAGNOSIS — I10 Essential (primary) hypertension: Secondary | ICD-10-CM | POA: Diagnosis not present

## 2020-07-15 DIAGNOSIS — E1165 Type 2 diabetes mellitus with hyperglycemia: Secondary | ICD-10-CM | POA: Diagnosis not present

## 2020-07-15 DIAGNOSIS — Z299 Encounter for prophylactic measures, unspecified: Secondary | ICD-10-CM | POA: Diagnosis not present

## 2020-07-15 DIAGNOSIS — D692 Other nonthrombocytopenic purpura: Secondary | ICD-10-CM | POA: Diagnosis not present

## 2020-07-15 NOTE — Progress Notes (Signed)
Remote pacemaker transmission.   

## 2020-07-22 ENCOUNTER — Other Ambulatory Visit: Payer: Self-pay

## 2020-07-22 ENCOUNTER — Encounter (INDEPENDENT_AMBULATORY_CARE_PROVIDER_SITE_OTHER): Payer: Medicare Other | Admitting: Ophthalmology

## 2020-07-22 DIAGNOSIS — H43813 Vitreous degeneration, bilateral: Secondary | ICD-10-CM

## 2020-07-22 DIAGNOSIS — H35033 Hypertensive retinopathy, bilateral: Secondary | ICD-10-CM

## 2020-07-22 DIAGNOSIS — H34832 Tributary (branch) retinal vein occlusion, left eye, with macular edema: Secondary | ICD-10-CM

## 2020-07-22 DIAGNOSIS — H353132 Nonexudative age-related macular degeneration, bilateral, intermediate dry stage: Secondary | ICD-10-CM

## 2020-07-22 DIAGNOSIS — I10 Essential (primary) hypertension: Secondary | ICD-10-CM

## 2020-07-24 DIAGNOSIS — I10 Essential (primary) hypertension: Secondary | ICD-10-CM | POA: Diagnosis not present

## 2020-07-31 DIAGNOSIS — J479 Bronchiectasis, uncomplicated: Secondary | ICD-10-CM | POA: Diagnosis not present

## 2020-07-31 DIAGNOSIS — E1165 Type 2 diabetes mellitus with hyperglycemia: Secondary | ICD-10-CM | POA: Diagnosis not present

## 2020-07-31 DIAGNOSIS — I429 Cardiomyopathy, unspecified: Secondary | ICD-10-CM | POA: Diagnosis not present

## 2020-07-31 DIAGNOSIS — I4891 Unspecified atrial fibrillation: Secondary | ICD-10-CM | POA: Diagnosis not present

## 2020-07-31 DIAGNOSIS — Z299 Encounter for prophylactic measures, unspecified: Secondary | ICD-10-CM | POA: Diagnosis not present

## 2020-07-31 DIAGNOSIS — I10 Essential (primary) hypertension: Secondary | ICD-10-CM | POA: Diagnosis not present

## 2020-08-21 ENCOUNTER — Other Ambulatory Visit: Payer: Self-pay | Admitting: *Deleted

## 2020-08-21 DIAGNOSIS — E876 Hypokalemia: Secondary | ICD-10-CM

## 2020-08-21 MED ORDER — POTASSIUM CHLORIDE CRYS ER 20 MEQ PO TBCR: EXTENDED_RELEASE_TABLET | ORAL | 0 refills | Status: DC

## 2020-08-24 ENCOUNTER — Other Ambulatory Visit: Payer: Self-pay | Admitting: *Deleted

## 2020-08-24 ENCOUNTER — Telehealth: Payer: Self-pay | Admitting: Cardiology

## 2020-08-24 DIAGNOSIS — E876 Hypokalemia: Secondary | ICD-10-CM

## 2020-08-24 MED ORDER — POTASSIUM CHLORIDE CRYS ER 20 MEQ PO TBCR: EXTENDED_RELEASE_TABLET | ORAL | 0 refills | Status: DC

## 2020-08-24 NOTE — Telephone Encounter (Signed)
Rx has been sent in as requested. Confirmation received. 

## 2020-08-24 NOTE — Telephone Encounter (Signed)
New message      Prescription sent to wrong pharmacy   *STAT* If patient is at the pharmacy, call can be transferred to refill team.   1. Which medications need to be refilled? (please list name of each medication and dose if known) potassium chloride SA (KLOR-CON) 20 MEQ tablet  2. Which pharmacy/location (including street and city if local pharmacy) is medication to be sent to? [facility]   3. Do they need a 30 day or 90 day supply? [facility]

## 2020-08-25 DIAGNOSIS — I10 Essential (primary) hypertension: Secondary | ICD-10-CM | POA: Diagnosis not present

## 2020-09-17 DIAGNOSIS — M1710 Unilateral primary osteoarthritis, unspecified knee: Secondary | ICD-10-CM | POA: Diagnosis not present

## 2020-09-17 DIAGNOSIS — M47816 Spondylosis without myelopathy or radiculopathy, lumbar region: Secondary | ICD-10-CM | POA: Diagnosis not present

## 2020-09-17 DIAGNOSIS — I429 Cardiomyopathy, unspecified: Secondary | ICD-10-CM | POA: Diagnosis not present

## 2020-09-17 DIAGNOSIS — I10 Essential (primary) hypertension: Secondary | ICD-10-CM | POA: Diagnosis not present

## 2020-09-17 DIAGNOSIS — E1165 Type 2 diabetes mellitus with hyperglycemia: Secondary | ICD-10-CM | POA: Diagnosis not present

## 2020-09-17 DIAGNOSIS — Z299 Encounter for prophylactic measures, unspecified: Secondary | ICD-10-CM | POA: Diagnosis not present

## 2020-09-17 DIAGNOSIS — M545 Low back pain: Secondary | ICD-10-CM | POA: Diagnosis not present

## 2020-09-21 DIAGNOSIS — M6281 Muscle weakness (generalized): Secondary | ICD-10-CM | POA: Diagnosis not present

## 2020-09-21 DIAGNOSIS — R2681 Unsteadiness on feet: Secondary | ICD-10-CM | POA: Diagnosis not present

## 2020-09-21 DIAGNOSIS — M25569 Pain in unspecified knee: Secondary | ICD-10-CM | POA: Diagnosis not present

## 2020-09-23 DIAGNOSIS — R2681 Unsteadiness on feet: Secondary | ICD-10-CM | POA: Diagnosis not present

## 2020-09-23 DIAGNOSIS — M6281 Muscle weakness (generalized): Secondary | ICD-10-CM | POA: Diagnosis not present

## 2020-09-23 DIAGNOSIS — M545 Low back pain: Secondary | ICD-10-CM | POA: Diagnosis not present

## 2020-09-23 DIAGNOSIS — M25569 Pain in unspecified knee: Secondary | ICD-10-CM | POA: Diagnosis not present

## 2020-09-24 DIAGNOSIS — I10 Essential (primary) hypertension: Secondary | ICD-10-CM | POA: Diagnosis not present

## 2020-09-25 DIAGNOSIS — M5459 Other low back pain: Secondary | ICD-10-CM | POA: Diagnosis not present

## 2020-09-25 DIAGNOSIS — R2681 Unsteadiness on feet: Secondary | ICD-10-CM | POA: Diagnosis not present

## 2020-09-25 DIAGNOSIS — M6281 Muscle weakness (generalized): Secondary | ICD-10-CM | POA: Diagnosis not present

## 2020-09-25 DIAGNOSIS — M25569 Pain in unspecified knee: Secondary | ICD-10-CM | POA: Diagnosis not present

## 2020-09-29 DIAGNOSIS — M6281 Muscle weakness (generalized): Secondary | ICD-10-CM | POA: Diagnosis not present

## 2020-09-29 DIAGNOSIS — M5459 Other low back pain: Secondary | ICD-10-CM | POA: Diagnosis not present

## 2020-09-29 DIAGNOSIS — M25569 Pain in unspecified knee: Secondary | ICD-10-CM | POA: Diagnosis not present

## 2020-09-29 DIAGNOSIS — R2681 Unsteadiness on feet: Secondary | ICD-10-CM | POA: Diagnosis not present

## 2020-10-01 DIAGNOSIS — M6281 Muscle weakness (generalized): Secondary | ICD-10-CM | POA: Diagnosis not present

## 2020-10-01 DIAGNOSIS — R2681 Unsteadiness on feet: Secondary | ICD-10-CM | POA: Diagnosis not present

## 2020-10-01 DIAGNOSIS — M25569 Pain in unspecified knee: Secondary | ICD-10-CM | POA: Diagnosis not present

## 2020-10-01 DIAGNOSIS — M5459 Other low back pain: Secondary | ICD-10-CM | POA: Diagnosis not present

## 2020-10-06 DIAGNOSIS — R2681 Unsteadiness on feet: Secondary | ICD-10-CM | POA: Diagnosis not present

## 2020-10-06 DIAGNOSIS — M5459 Other low back pain: Secondary | ICD-10-CM | POA: Diagnosis not present

## 2020-10-06 DIAGNOSIS — M25569 Pain in unspecified knee: Secondary | ICD-10-CM | POA: Diagnosis not present

## 2020-10-06 DIAGNOSIS — M6281 Muscle weakness (generalized): Secondary | ICD-10-CM | POA: Diagnosis not present

## 2020-10-08 DIAGNOSIS — M5459 Other low back pain: Secondary | ICD-10-CM | POA: Diagnosis not present

## 2020-10-08 DIAGNOSIS — R2681 Unsteadiness on feet: Secondary | ICD-10-CM | POA: Diagnosis not present

## 2020-10-08 DIAGNOSIS — M25569 Pain in unspecified knee: Secondary | ICD-10-CM | POA: Diagnosis not present

## 2020-10-08 DIAGNOSIS — M6281 Muscle weakness (generalized): Secondary | ICD-10-CM | POA: Diagnosis not present

## 2020-10-12 ENCOUNTER — Ambulatory Visit (INDEPENDENT_AMBULATORY_CARE_PROVIDER_SITE_OTHER): Payer: Medicare Other

## 2020-10-12 DIAGNOSIS — I495 Sick sinus syndrome: Secondary | ICD-10-CM | POA: Diagnosis not present

## 2020-10-13 DIAGNOSIS — R2681 Unsteadiness on feet: Secondary | ICD-10-CM | POA: Diagnosis not present

## 2020-10-13 DIAGNOSIS — M6281 Muscle weakness (generalized): Secondary | ICD-10-CM | POA: Diagnosis not present

## 2020-10-13 DIAGNOSIS — M5459 Other low back pain: Secondary | ICD-10-CM | POA: Diagnosis not present

## 2020-10-13 DIAGNOSIS — M25569 Pain in unspecified knee: Secondary | ICD-10-CM | POA: Diagnosis not present

## 2020-10-13 LAB — CUP PACEART REMOTE DEVICE CHECK
Battery Remaining Longevity: 106 mo
Battery Remaining Percentage: 89 %
Battery Voltage: 2.95 V
Brady Statistic RV Percent Paced: 51 %
Date Time Interrogation Session: 20211018020018
Implantable Lead Implant Date: 20140919
Implantable Lead Implant Date: 20140919
Implantable Lead Location: 753859
Implantable Lead Location: 753860
Implantable Pulse Generator Implant Date: 20140919
Lead Channel Impedance Value: 400 Ohm
Lead Channel Pacing Threshold Amplitude: 0.75 V
Lead Channel Pacing Threshold Pulse Width: 0.5 ms
Lead Channel Sensing Intrinsic Amplitude: 4.3 mV
Lead Channel Setting Pacing Amplitude: 2.5 V
Lead Channel Setting Pacing Pulse Width: 0.5 ms
Lead Channel Setting Sensing Sensitivity: 2 mV
Pulse Gen Model: 2240
Pulse Gen Serial Number: 7536055

## 2020-10-15 DIAGNOSIS — R2681 Unsteadiness on feet: Secondary | ICD-10-CM | POA: Diagnosis not present

## 2020-10-15 DIAGNOSIS — M25569 Pain in unspecified knee: Secondary | ICD-10-CM | POA: Diagnosis not present

## 2020-10-15 DIAGNOSIS — M6281 Muscle weakness (generalized): Secondary | ICD-10-CM | POA: Diagnosis not present

## 2020-10-15 DIAGNOSIS — M5459 Other low back pain: Secondary | ICD-10-CM | POA: Diagnosis not present

## 2020-10-19 NOTE — Progress Notes (Signed)
Remote pacemaker transmission.   

## 2020-10-20 DIAGNOSIS — R2681 Unsteadiness on feet: Secondary | ICD-10-CM | POA: Diagnosis not present

## 2020-10-20 DIAGNOSIS — M25569 Pain in unspecified knee: Secondary | ICD-10-CM | POA: Diagnosis not present

## 2020-10-20 DIAGNOSIS — M5459 Other low back pain: Secondary | ICD-10-CM | POA: Diagnosis not present

## 2020-10-20 DIAGNOSIS — M6281 Muscle weakness (generalized): Secondary | ICD-10-CM | POA: Diagnosis not present

## 2020-10-21 DIAGNOSIS — Z23 Encounter for immunization: Secondary | ICD-10-CM | POA: Diagnosis not present

## 2020-10-22 DIAGNOSIS — M5459 Other low back pain: Secondary | ICD-10-CM | POA: Diagnosis not present

## 2020-10-22 DIAGNOSIS — M6281 Muscle weakness (generalized): Secondary | ICD-10-CM | POA: Diagnosis not present

## 2020-10-22 DIAGNOSIS — M25569 Pain in unspecified knee: Secondary | ICD-10-CM | POA: Diagnosis not present

## 2020-10-22 DIAGNOSIS — R2681 Unsteadiness on feet: Secondary | ICD-10-CM | POA: Diagnosis not present

## 2020-10-23 ENCOUNTER — Encounter: Payer: Self-pay | Admitting: Cardiology

## 2020-10-23 ENCOUNTER — Ambulatory Visit (INDEPENDENT_AMBULATORY_CARE_PROVIDER_SITE_OTHER): Payer: Medicare Other | Admitting: Cardiology

## 2020-10-23 ENCOUNTER — Other Ambulatory Visit: Payer: Self-pay

## 2020-10-23 VITALS — BP 132/78 | HR 78 | Ht 73.0 in | Wt 175.0 lb

## 2020-10-23 DIAGNOSIS — I2510 Atherosclerotic heart disease of native coronary artery without angina pectoris: Secondary | ICD-10-CM | POA: Diagnosis not present

## 2020-10-23 DIAGNOSIS — I5033 Acute on chronic diastolic (congestive) heart failure: Secondary | ICD-10-CM

## 2020-10-23 DIAGNOSIS — I482 Chronic atrial fibrillation, unspecified: Secondary | ICD-10-CM | POA: Diagnosis not present

## 2020-10-23 MED ORDER — FUROSEMIDE 40 MG PO TABS: 40.0000 mg | ORAL_TABLET | Freq: Every day | ORAL | 3 refills | Status: DC

## 2020-10-23 NOTE — Patient Instructions (Addendum)
Medication Instructions:  STOP HCTZ   STOP Amlodipine   START Lasix 40 mg daily    *If you need a refill on your cardiac medications before your next appointment, please call your pharmacy*   Lab Work: Magnesium, Bmet in 2 weeks (11/06/20)   If you have labs (blood work) drawn today and your tests are completely normal, you will receive your results only by: Marland Kitchen MyChart Message (if you have MyChart) OR . A paper copy in the mail If you have any lab test that is abnormal or we need to change your treatment, we will call you to review the results.   Testing/Procedures: None today   Follow-Up: At [facility], you and your health needs are our priority.  As part of our continuing mission to provide you with exceptional heart care, we have created designated Provider Care Teams.  These Care Teams include your primary Cardiologist (physician) and Advanced Practice Providers (APPs -  Physician Assistants and Nurse Practitioners) who all work together to provide you with the care you need, when you need it.  We recommend signing up for the patient portal called "MyChart".  Sign up information is provided on this After Visit Summary.  MyChart is used to connect with patients for Virtual Visits (Telemedicine).  Patients are able to view lab/test results, encounter notes, upcoming appointments, etc.  Non-urgent messages can be sent to your provider as well.   To learn more about what you can do with MyChart, go to [email or web address].    Your next appointment:   1 month(s)  The format for your next appointment:   In Person  Provider:   Bernerd Pho, PA-C or Ermalinda Barrios, PA-C   Other Instructions None      Thank you for choosing [facility] !

## 2020-10-23 NOTE — Progress Notes (Signed)
Clinical Summary [name] is a 86 y.o.male seen today for follow up of the following medical problems.   1. Mild systolic dysfunction  - echo 07/2012 w/ LVEF 40-45%  - Cath 08/2013 w/ minimal CAD, LV gram 60% with normal filling pressures on RHC.   2018 echo LVEF 55-60%.   - increase in LE edema, some SOB - we had noted some increasing edmea previously but he was reluctant to change diuretics.    2. HTN  - accepting higher bp's due to prior dizziness -compliant with meds  3. Bradycardia  -s/p St Jude dual chamber pacemaker  - followed by EP  09/2020 normal device check.   4. Hyperlipidemia - labs followed by pcp - compliant with statin  5. Afib -no palpitations  6. CAD  - patent major arteries with evidence of disease in diags and OMs fromprior cath -no chest pains  SH: wife on chemo and radiationalong with recent hysterectomy, endometrialcancer.Just completed treatments, told she is cancer free   Past Medical History:  Diagnosis Date  . Allergic rhinitis   . Anemia   . Anxiety   . Arthritis    "all over my body" (09/10/2013)  . BPH (benign prostatic hypertrophy)   . CAD (coronary artery disease)    a. 08/2013 LM nl, LAD 73m, D1 80ost, 95p, LCX 30m, RCA 20d, EF 60%.  . Chronic lower back pain   . Depression   . Diabetes mellitus    "diet controlled" (09/10/2013)  . Diaphragmatic hernia without mention of obstruction or gangrene   . Diastolic congestive heart failure ([place])   . Elevated liver function tests   . Essential hypertension, benign   . Lacunar infarction ([place])    a. 08/2013 post-cath, MRI: sm acute lacunar infarcts in LPCA and LSCA, ? tiny RPICA lacunar infarct.  . Obesity   . OSA on CPAP    "wears mask part of the time" (09/10/2013)  . Osteoporosis   . Sinus node dysfunction ([place])    a. 08/2013 s/p SJM DC PPM.     Allergies  Allergen Reactions  . Fentanyl Other (See Comments)    resp suppression per wife, out for 2  days      Current Outpatient Medications  Medication Sig Dispense Refill  . acetaminophen (TYLENOL) 500 MG tablet Take 500 mg by mouth every 6 (six) hours as needed for pain.    Marland Kitchen alendronate (FOSAMAX) 70 MG tablet Take 70 mg by mouth every 7 (seven) days. Take with a full glass of water on an empty stomach. On Mondays    . amLODipine (NORVASC) 5 MG tablet Take 1 tablet by mouth daily.    Marland Kitchen apixaban (ELIQUIS) 5 MG TABS tablet Take 1 tablet (5 mg total) by mouth 2 (two) times daily. 180 tablet 3  . Calcium Carbonate-Vitamin D (CALCIUM 600 + D PO) Take 600 mg by mouth 2 (two) times daily.    . dorzolamide-timolol (COSOPT) 22.3-6.8 MG/ML ophthalmic solution USE ONE DROP IN LEFT EYE TWICE DAILY    . ELIQUIS 5 MG TABS tablet TAKE 1 TABLET BY MOUTH TWICE DAILY 60 tablet 11  . enalapril (VASOTEC) 20 MG tablet Take 20 mg by mouth daily.    . fish oil-omega-3 fatty acids 1000 MG capsule Take 1 g by mouth 2 (two) times daily.      . hydrochlorothiazide (HYDRODIURIL) 25 MG tablet TAKE 1 TABLET BY MOUTH DAILY 90 tablet 3  . isosorbide mononitrate (IMDUR) 30 MG 24  hr tablet Take 30 mg by mouth daily.    . metFORMIN (GLUCOPHAGE) 500 MG tablet Take 500 mg by mouth daily with breakfast.    . Multiple Vitamin (MULTIVITAMIN WITH MINERALS) TABS tablet Take 1 tablet by mouth daily.    . nitroGLYCERIN (NITROSTAT) 0.4 MG SL tablet Place 1 tablet (0.4 mg total) under the tongue every 5 (five) minutes x 3 doses as needed for chest pain. 25 tablet 3  . potassium chloride SA (KLOR-CON) 20 MEQ tablet TAKE 1 TABLET BY MOUTH EVERY DAY 90 tablet 0  . pravastatin (PRAVACHOL) 20 MG tablet TAKE 1 TABLET (20 MG TOTAL) BY MOUTH EVERY EVENING. 90 tablet 3  . tamsulosin (FLOMAX) 0.4 MG CAPS capsule Take 0.4 mg by mouth daily.     . traMADol (ULTRAM) 50 MG tablet Take 50 mg by mouth every 6 (six) hours as needed.      No current facility-administered medications for this visit.     Past Surgical History:  Procedure  Laterality Date  . CARDIAC CATHETERIZATION  1998  . CATARACT EXTRACTION W/ INTRAOCULAR LENS  IMPLANT, BILATERAL  2010  . LEFT AND RIGHT HEART CATHETERIZATION WITH CORONARY ANGIOGRAM N/A 09/11/2013   Procedure: LEFT AND RIGHT HEART CATHETERIZATION WITH CORONARY ANGIOGRAM;  Surgeon: Wellington Hampshire, MD;  Location: [facility] CATH LAB;  Service: Cardiovascular;  Laterality: N/A;  . PERMANENT PACEMAKER INSERTION N/A 09/13/2013   Procedure: PERMANENT PACEMAKER INSERTION;  Surgeon: Evans Lance, MD;  Location: [facility] CATH LAB;  Service: Cardiovascular;  Laterality: N/A;  . right hip fx  05/26/2016  . right wrist fx  05/26/2016  . STOMACH SURGERY  03/02/2012   ?volvus; "stomach came loose and twisted; went up under rib cage; had that corrected; tacked it up" (09/10/2013)     Allergies  Allergen Reactions  . Fentanyl Other (See Comments)    resp suppression per wife, out for 2 days       Family History  Problem Relation Age of Onset  . Hypertension Father   . Hypertension Mother      Social History [name] reports that he has never smoked. He has never used smokeless tobacco. [name] reports no history of alcohol use.   Review of Systems CONSTITUTIONAL: No weight loss, fever, chills, weakness or fatigue.  HEENT: Eyes: No visual loss, blurred vision, double vision or yellow sclerae.No hearing loss, sneezing, congestion, runny nose or sore throat.  SKIN: No rash or itching.  CARDIOVASCULAR: per hpi RESPIRATORY: No shortness of breath, cough or sputum.  GASTROINTESTINAL: No anorexia, nausea, vomiting or diarrhea. No abdominal pain or blood.  GENITOURINARY: No burning on urination, no polyuria NEUROLOGICAL: No headache, dizziness, syncope, paralysis, ataxia, numbness or tingling in the extremities. No change in bowel or bladder control.  MUSCULOSKELETAL: No muscle, back pain, joint pain or stiffness.  LYMPHATICS: No enlarged nodes. No history of splenectomy.  PSYCHIATRIC: No history of  depression or anxiety.  ENDOCRINOLOGIC: No reports of sweating, cold or heat intolerance. No polyuria or polydipsia.  Marland Kitchen   Physical Examination Today's Vitals   10/23/20 1321  BP: 132/78  Pulse: 78  SpO2: 98%  Weight: 175 lb (79.4 kg)  Height: 6\' 1"  (1.854 m)   Body mass index is 23.09 kg/m.  Gen: resting comfortably, no acute distress HEENT: no scleral icterus, pupils equal round and reactive, no palptable cervical adenopathy,  CV: RRR, no m/r/g, no jvd Resp: Clear to auscultation bilaterally GI: abdomen is soft, non-tender, non-distended, normal bowel sounds, no hepatosplenomegaly  MSK: extremities are warm, no edema.  Skin: warm, no rash Neuro:  no focal deficits Psych: appropriate affect   Diagnostic Studies Cardiac Catheterization 9.17.2014 Procedural Findings:  Hemodynamics RA 3 mmHg  RV 37/1 mmHg  PA 33/12 mmHg  PCWP 6 mmHg  LV 201/4 mHg . LVEDP: 12 mmHg  AO 202/94 mmHg  Oxygen saturations:  PA 74%  AO 94%  Cardiac Output (Fick) 6.72  Cardiac Index (Fick) 3.22  Pulmonary vascular resistance (PVR): 1.9 [place] units.  Coronary angiography: Coronary dominance: right   Left Main: Normal in size with no significant disease.   Left Anterior Descending (LAD): Large in size with mild diffuse atherosclerosis. There is diffuse 20% disease in the midsegment.   1st diagonal (D1): Normal in size with 80% ostial stenosis followed by a 95% proximal stenosis.   2nd diagonal (D2): Medium in size with minor irregularities.   3rd diagonal (D3): Normal in size with no significant disease.   Circumflex (LCx): Normal in size and nondominant. There is 20% mid stenosis.   1st obtuse marginal: Medium in size with minor irregularities.   2nd obtuse marginal: Large is in size with no significant disease.   3rd obtuse marginal: Normal in size with minor irregularities.   Right Coronary Artery: large in size and dominant. There is 20% stenosis  distally.   Posterior descending artery: normal in size with no significant disease.   Posterior AV segment: normal in size with no significant disease.   Posterolateral branchs: PL 1 is large with no significant disease. PL 2 is small. Left ventriculography: Left ventricular systolic function is normal , LVEF is estimated at 60 %, there is no significant mitral regurgitation   07/2012 Echo: mild LVH, LVEF 40-45%, LAE, mild MR,    Assessment and Plan  1.History of cardiomyopathy/Acute on chronic diastolic heart failiure - LVEF has since normalized, still with some diastolic dysfunction --signifiacnt incrase in LE edema. Stop HCTZ, start lasix 40mg  daily, stop norvasc. Check BMET/Mg in 2 weeks - significant back pain and mobility limitations, have not repeat an echo but if difficult to manage edema will consider  2. CAD  - patent major arteries with evidence of disease in diags and OMs fromprior cath - no symptoms, continue to monitor.   3. HTN  -accepting high bp's for him at this time due to prior dizziness - stopoping norvasc due to LE edema, unclear if could be playing a role but will try discontinuing.   4. Bradycardia  - no symptoms, normal pacemaker check recently, continue to monitor.    5. Hyperlipidemia -follow pcp labs, has been at goal,. Continu estatin.   6. Afib - no symptoms, continue current meds  F/u 1 month reassess lower extremity edema   Arnoldo Lenis, M.D.

## 2020-10-24 DIAGNOSIS — I10 Essential (primary) hypertension: Secondary | ICD-10-CM | POA: Diagnosis not present

## 2020-10-27 DIAGNOSIS — M5459 Other low back pain: Secondary | ICD-10-CM | POA: Diagnosis not present

## 2020-10-27 DIAGNOSIS — R2681 Unsteadiness on feet: Secondary | ICD-10-CM | POA: Diagnosis not present

## 2020-10-27 DIAGNOSIS — M25569 Pain in unspecified knee: Secondary | ICD-10-CM | POA: Diagnosis not present

## 2020-10-27 DIAGNOSIS — M6281 Muscle weakness (generalized): Secondary | ICD-10-CM | POA: Diagnosis not present

## 2020-10-28 DIAGNOSIS — M549 Dorsalgia, unspecified: Secondary | ICD-10-CM | POA: Diagnosis not present

## 2020-10-28 DIAGNOSIS — E1165 Type 2 diabetes mellitus with hyperglycemia: Secondary | ICD-10-CM | POA: Diagnosis not present

## 2020-10-28 DIAGNOSIS — I10 Essential (primary) hypertension: Secondary | ICD-10-CM | POA: Diagnosis not present

## 2020-10-28 DIAGNOSIS — M545 Low back pain, unspecified: Secondary | ICD-10-CM | POA: Diagnosis not present

## 2020-10-28 DIAGNOSIS — Z299 Encounter for prophylactic measures, unspecified: Secondary | ICD-10-CM | POA: Diagnosis not present

## 2020-10-28 DIAGNOSIS — C642 Malignant neoplasm of left kidney, except renal pelvis: Secondary | ICD-10-CM | POA: Diagnosis not present

## 2020-10-28 DIAGNOSIS — I4891 Unspecified atrial fibrillation: Secondary | ICD-10-CM | POA: Diagnosis not present

## 2020-10-29 DIAGNOSIS — M5459 Other low back pain: Secondary | ICD-10-CM | POA: Diagnosis not present

## 2020-10-29 DIAGNOSIS — M6281 Muscle weakness (generalized): Secondary | ICD-10-CM | POA: Diagnosis not present

## 2020-10-29 DIAGNOSIS — R2681 Unsteadiness on feet: Secondary | ICD-10-CM | POA: Diagnosis not present

## 2020-10-29 DIAGNOSIS — M25569 Pain in unspecified knee: Secondary | ICD-10-CM | POA: Diagnosis not present

## 2020-10-31 DIAGNOSIS — Z23 Encounter for immunization: Secondary | ICD-10-CM | POA: Diagnosis not present

## 2020-11-03 DIAGNOSIS — I10 Essential (primary) hypertension: Secondary | ICD-10-CM | POA: Diagnosis not present

## 2020-11-03 DIAGNOSIS — M549 Dorsalgia, unspecified: Secondary | ICD-10-CM | POA: Diagnosis not present

## 2020-11-03 DIAGNOSIS — M48061 Spinal stenosis, lumbar region without neurogenic claudication: Secondary | ICD-10-CM | POA: Diagnosis not present

## 2020-11-03 DIAGNOSIS — S22080A Wedge compression fracture of T11-T12 vertebra, initial encounter for closed fracture: Secondary | ICD-10-CM | POA: Diagnosis not present

## 2020-11-05 DIAGNOSIS — M6281 Muscle weakness (generalized): Secondary | ICD-10-CM | POA: Diagnosis not present

## 2020-11-05 DIAGNOSIS — R2681 Unsteadiness on feet: Secondary | ICD-10-CM | POA: Diagnosis not present

## 2020-11-05 DIAGNOSIS — M5459 Other low back pain: Secondary | ICD-10-CM | POA: Diagnosis not present

## 2020-11-05 DIAGNOSIS — M25569 Pain in unspecified knee: Secondary | ICD-10-CM | POA: Diagnosis not present

## 2020-11-06 ENCOUNTER — Other Ambulatory Visit (HOSPITAL_COMMUNITY)
Admission: RE | Admit: 2020-11-06 | Discharge: 2020-11-06 | Disposition: A | Discharge status: Home / Self Care | Payer: Medicare Other | Source: Ambulatory Visit | Attending: Cardiology | Admitting: Cardiology

## 2020-11-06 ENCOUNTER — Other Ambulatory Visit: Payer: Self-pay

## 2020-11-06 DIAGNOSIS — I482 Chronic atrial fibrillation, unspecified: Secondary | ICD-10-CM | POA: Insufficient documentation

## 2020-11-06 LAB — BASIC METABOLIC PANEL
Anion gap: 10 (ref 5–15)
BUN: 17 mg/dL (ref 8–23)
CO2: 31 mmol/L (ref 22–32)
Calcium: 9.1 mg/dL (ref 8.9–10.3)
Chloride: 97 mmol/L — ABNORMAL LOW (ref 98–111)
Creatinine, Ser: 0.89 mg/dL (ref 0.61–1.24)
GFR, Estimated: >60 mL/min (ref >60)
Glucose, Bld: 163 mg/dL — ABNORMAL HIGH (ref 70–99)
Potassium: 3.5 mmol/L (ref 3.5–5.1)
Sodium: 138 mmol/L (ref 135–145)

## 2020-11-06 LAB — MAGNESIUM: Magnesium: 1.7 mg/dL (ref 1.7–2.4)

## 2020-11-10 ENCOUNTER — Telehealth: Payer: Self-pay | Admitting: *Deleted

## 2020-11-10 DIAGNOSIS — M25569 Pain in unspecified knee: Secondary | ICD-10-CM | POA: Diagnosis not present

## 2020-11-10 DIAGNOSIS — M5459 Other low back pain: Secondary | ICD-10-CM | POA: Diagnosis not present

## 2020-11-10 DIAGNOSIS — R2681 Unsteadiness on feet: Secondary | ICD-10-CM | POA: Diagnosis not present

## 2020-11-10 DIAGNOSIS — M6281 Muscle weakness (generalized): Secondary | ICD-10-CM | POA: Diagnosis not present

## 2020-11-10 NOTE — Telephone Encounter (Signed)
Pt wife DPR made aware

## 2020-11-10 NOTE — Telephone Encounter (Signed)
-----   Message from Arnoldo Lenis, MD sent at 11/09/2020  9:09 AM EST ----- Labs look fine   Zandra Abts MD

## 2020-11-11 DIAGNOSIS — I10 Essential (primary) hypertension: Secondary | ICD-10-CM | POA: Diagnosis not present

## 2020-11-11 DIAGNOSIS — Z299 Encounter for prophylactic measures, unspecified: Secondary | ICD-10-CM | POA: Diagnosis not present

## 2020-11-11 DIAGNOSIS — E1165 Type 2 diabetes mellitus with hyperglycemia: Secondary | ICD-10-CM | POA: Diagnosis not present

## 2020-11-11 DIAGNOSIS — R413 Other amnesia: Secondary | ICD-10-CM | POA: Diagnosis not present

## 2020-11-17 DIAGNOSIS — M6281 Muscle weakness (generalized): Secondary | ICD-10-CM | POA: Diagnosis not present

## 2020-11-17 DIAGNOSIS — M25569 Pain in unspecified knee: Secondary | ICD-10-CM | POA: Diagnosis not present

## 2020-11-17 DIAGNOSIS — M5459 Other low back pain: Secondary | ICD-10-CM | POA: Diagnosis not present

## 2020-11-17 DIAGNOSIS — R2681 Unsteadiness on feet: Secondary | ICD-10-CM | POA: Diagnosis not present

## 2020-11-17 NOTE — Progress Notes (Signed)
Cardiology Office Note    Date:  11/24/2020   ID:  [name], DOB 1933-03-20, MRN [id number]  PCP:  Glenda Chroman, MD  Cardiologist: Carlyle Dolly, MD EPS: Cristopher Peru, MD  Chief Complaint  Patient presents with  . Follow-up    History of Present Illness:  [name] is a 86 y.o. male with history of CAD cath 2014 minimal disease, history of mild LV dysfunction in 2013 LVEF 40 to 45% echo 2018 LVEF 55 to 60%, PAF, bradycardia status post [facility] dual chamber pacemaker, hypertension, hyperlipidemia  Patient saw Dr. Harl Bowie 10/23/2020 at which time he had increased lower extremity edema.  He stopped HCTZ and started Lasix 40 mg daily.  Also stopped Norvasc.  Did not order an echo because of patient's significant back pain and mobility limitations but consider if edema does not improve.  Patient comes in for f/u with his wife. Swelling has improved and weight down 8 lbs. Denies chest pain or SOB. BP up here but ok at home. Is getting some extra salt.   Past Medical History:  Diagnosis Date  . Allergic rhinitis   . Anemia   . Anxiety   . Arthritis    "all over my body" (09/10/2013)  . BPH (benign prostatic hypertrophy)   . CAD (coronary artery disease)    a. 08/2013 LM nl, LAD 82m, D1 80ost, 95p, LCX 43m, RCA 20d, EF 60%.  . Chronic lower back pain   . Depression   . Diabetes mellitus    "diet controlled" (09/10/2013)  . Diaphragmatic hernia without mention of obstruction or gangrene   . Diastolic congestive heart failure ([place])   . Elevated liver function tests   . Essential hypertension, benign   . Lacunar infarction ([place])    a. 08/2013 post-cath, MRI: sm acute lacunar infarcts in LPCA and LSCA, ? tiny RPICA lacunar infarct.  . Obesity   . OSA on CPAP    "wears mask part of the time" (09/10/2013)  . Osteoporosis   . Sinus node dysfunction ([place])    a. 08/2013 s/p SJM DC PPM.    Past Surgical History:  Procedure Laterality Date  . CARDIAC CATHETERIZATION   1998  . CATARACT EXTRACTION W/ INTRAOCULAR LENS  IMPLANT, BILATERAL  2010  . LEFT AND RIGHT HEART CATHETERIZATION WITH CORONARY ANGIOGRAM N/A 09/11/2013   Procedure: LEFT AND RIGHT HEART CATHETERIZATION WITH CORONARY ANGIOGRAM;  Surgeon: Wellington Hampshire, MD;  Location: [facility] CATH LAB;  Service: Cardiovascular;  Laterality: N/A;  . PERMANENT PACEMAKER INSERTION N/A 09/13/2013   Procedure: PERMANENT PACEMAKER INSERTION;  Surgeon: Evans Lance, MD;  Location: [facility] CATH LAB;  Service: Cardiovascular;  Laterality: N/A;  . right hip fx  05/26/2016  . right wrist fx  05/26/2016  . STOMACH SURGERY  03/02/2012   ?volvus; "stomach came loose and twisted; went up under rib cage; had that corrected; tacked it up" (09/10/2013)    Current Medications: Current Meds  Medication Sig  . acetaminophen (TYLENOL) 500 MG tablet Take 500 mg by mouth every 6 (six) hours as needed for pain.  Marland Kitchen alendronate (FOSAMAX) 70 MG tablet Take 70 mg by mouth every 7 (seven) days. Take with a full glass of water on an empty stomach. On Mondays  . apixaban (ELIQUIS) 5 MG TABS tablet Take 1 tablet (5 mg total) by mouth 2 (two) times daily.  . Calcium Carbonate-Vitamin D (CALCIUM 600 + D PO) Take 600 mg by mouth 2 (two) times daily.  Marland Kitchen  dorzolamide-timolol (COSOPT) 22.3-6.8 MG/ML ophthalmic solution USE ONE DROP IN LEFT EYE TWICE DAILY  . enalapril (VASOTEC) 20 MG tablet Take 20 mg by mouth daily.  . fish oil-omega-3 fatty acids 1000 MG capsule Take 1 g by mouth 2 (two) times daily.    . furosemide (LASIX) 40 MG tablet Take 1 tablet (40 mg total) by mouth daily.  . isosorbide mononitrate (IMDUR) 30 MG 24 hr tablet Take 30 mg by mouth daily.  . metFORMIN (GLUCOPHAGE) 500 MG tablet Take 500 mg by mouth daily with breakfast.  . Multiple Vitamin (MULTIVITAMIN WITH MINERALS) TABS tablet Take 1 tablet by mouth daily.  . nitroGLYCERIN (NITROSTAT) 0.4 MG SL tablet Place 1 tablet (0.4 mg total) under the tongue every 5 (five) minutes x 3 doses as  needed for chest pain.  . potassium chloride SA (KLOR-CON) 20 MEQ tablet TAKE 1 TABLET BY MOUTH EVERY DAY  . pravastatin (PRAVACHOL) 20 MG tablet TAKE 1 TABLET (20 MG TOTAL) BY MOUTH EVERY EVENING.  . tamsulosin (FLOMAX) 0.4 MG CAPS capsule Take 0.4 mg by mouth daily.   . traMADol (ULTRAM) 50 MG tablet Take 50 mg by mouth every 6 (six) hours as needed.      Allergies:   Fentanyl   Social History   Socioeconomic History  . Marital status: Married    Spouse name: Not on file  . Number of children: 2  . Years of education: Not on file  . Highest education level: Not on file  Occupational History  . Occupation: Tax inspector FOR [employer]    Comment: WORKED IN [employer]  Tobacco Use  . Smoking status: Never Smoker  . Smokeless tobacco: Never Used  Vaping Use  . Vaping Use: Never used  Substance and Sexual Activity  . Alcohol use: No    Alcohol/week: 0.0 standard drinks  . Drug use: No  . Sexual activity: Never  Other Topics Concern  . Not on file  Social History Narrative  . Not on file   Social Determinants of Health   Financial Resource Strain:   . Difficulty of Paying Living Expenses: Not on file  Food Insecurity:   . Worried About Charity fundraiser in the Last Year: Not on file  . Ran Out of Food in the Last Year: Not on file  Transportation Needs:   . Lack of Transportation (Medical): Not on file  . Lack of Transportation (Non-Medical): Not on file  Physical Activity:   . Days of Exercise per Week: Not on file  . Minutes of Exercise per Session: Not on file  Stress:   . Feeling of Stress : Not on file  Social Connections:   . Frequency of Communication with Friends and Family: Not on file  . Frequency of Social Gatherings with Friends and Family: Not on file  . Attends Religious Services: Not on file  . Active Member of Clubs or Organizations: Not on file  . Attends Archivist Meetings: Not on file  . Marital Status: Not on file     Family  History:  The patient's family history includes Hypertension in his father and mother.   ROS:   Please see the history of present illness.    ROS All other systems reviewed and are negative.   PHYSICAL EXAM:   VS:  BP (!) 148/92   Pulse 71   Ht 6\' 1"  (1.854 m)   Wt 167 lb 3.2 oz (75.8 kg)   SpO2 97%   BMI  22.06 kg/m   Physical Exam  GEN: Well nourished, well developed, in no acute distress  Neck: no JVD, carotid bruits, or masses Cardiac:RRR; no murmurs, rubs, or gallops  Respiratory:  clear to auscultation bilaterally, normal work of breathing GI: soft, nontender, nondistended, + BS Ext: +1-2 edema bilaterally, Good distal pulses bilaterally Neuro:  Alert and Oriented x 3, Strength and sensation are intact Psych: euthymic mood, full affect  Wt Readings from Last 3 Encounters:  11/24/20 167 lb 3.2 oz (75.8 kg)  10/23/20 175 lb (79.4 kg)  02/19/20 181 lb (82.1 kg)      Studies/Labs Reviewed:   EKG:  EKG is not ordered today.    Recent Labs: 11/06/2020: BUN 17; Creatinine, Ser 0.89; Magnesium 1.7; Potassium 3.5; Sodium 138   Lipid Panel    Component Value Date/Time   CHOL 125 09/14/2013 0625   TRIG 49 09/14/2013 0625   HDL 61 09/14/2013 0625   CHOLHDL 2.0 09/14/2013 0625   VLDL 10 09/14/2013 0625   LDLCALC 54 09/14/2013 0625    Additional studies/ records that were reviewed today include:  Cardiac Catheterization 9.17.2014   Procedural Findings:   Hemodynamics   RA 3 mmHg   RV 37/1 mmHg   PA 33/12 mmHg   PCWP 6 mmHg   LV 201/4 mHg . LVEDP: 12 mmHg   AO 202/94 mmHg   Oxygen saturations:   PA 74%   AO 94%   Cardiac Output (Fick) 6.72   Cardiac Index (Fick) 3.22   Pulmonary vascular resistance (PVR): 1.9 Woods units.   Coronary angiography:   Coronary dominance: right    Left Main: Normal in size with no significant disease.    Left Anterior Descending (LAD): Large in size with mild diffuse atherosclerosis. There is diffuse 20% disease in the  midsegment.    1st diagonal (D1): Normal in size with 80% ostial stenosis followed by a 95% proximal stenosis.    2nd diagonal (D2): Medium in size with minor irregularities.    3rd diagonal (D3): Normal in size with no significant disease.    Circumflex (LCx): Normal in size and nondominant. There is 20% mid stenosis.    1st obtuse marginal: Medium in size with minor irregularities.    2nd obtuse marginal: Large is in size with no significant disease.    3rd obtuse marginal: Normal in size with minor irregularities.    Right Coronary Artery: large in size and dominant. There is 20% stenosis distally.    Posterior descending artery: normal in size with no significant disease.    Posterior AV segment: normal in size with no significant disease.    Posterolateral branchs: PL 1 is large with no significant disease. PL 2 is small. Left ventriculography: Left ventricular systolic function is normal , LVEF is estimated at 60 %, there is no significant mitral regurgitation     07/2012 Echo: mild LVH, LVEF 40-45%, LAE, mild MR,          ASSESSMENT:    1. Chronic diastolic CHF (congestive heart failure) ([place])   2. NICM (nonischemic cardiomyopathy) ([place])   3. Coronary artery disease involving native coronary artery of native heart without angina pectoris   4. Essential hypertension   5. Paroxysmal atrial fibrillation (H[facility])   6. Pacemaker   7. Other hyperlipidemia   8. Hypokalemia      PLAN:  In order of problems listed above:  Chronic diastolic CHF started on Lasix 40 mg once daily 10/23/2020 Norvasc and HCTZ were stopped.  Echo not ordered because of mobility limitations. Still has some swelling but has gone down significantly. 2 gm sodium diet, compression hose.  Can take extra Lasix  History of cardiomyopathy which normalized in 2018.  Consider echo if ongoing edema per Dr. Harl Bowie. Has mild edema but with mobility issues and improvement will not pursue echo at this  time.  CAD with cath in 2014 nonobstructive disease moderate disease in diagonals and OM  Essential hypertension excepting higher blood pressures from because of prior dizziness.  Amlodipine stopped last office visit  PAF on Eliquis. Crt 0.89 11/06/20. In donut hole. Will give samples.   History of bradycardia status post pacemaker  Hyperlipidemia on pravachol    Medication Adjustments/Labs and Tests Ordered: Current medicines are reviewed at length with the patient today.  Concerns regarding medicines are outlined above.  Medication changes, Labs and Tests ordered today are listed in the Patient Instructions below. Patient Instructions   Medication Instructions:  Your physician recommends that you continue on your current medications as directed. Please refer to the Current Medication list given to you today.  *If you need a refill on your cardiac medications before your next appointment, please call your pharmacy*   Lab Work: None If you have labs (blood work) drawn today and your tests are completely normal, you will receive your results only by: Marland Kitchen MyChart Message (if you have MyChart) OR . A paper copy in the mail If you have any lab test that is abnormal or we need to change your treatment, we will call you to review the results.   Testing/Procedures: None   Follow-Up: At [facility], you and your health needs are our priority.  As part of our continuing mission to provide you with exceptional heart care, we have created designated Provider Care Teams.  These Care Teams include your primary Cardiologist (physician) and Advanced Practice Providers (APPs -  Physician Assistants and Nurse Practitioners) who all work together to provide you with the care you need, when you need it.  We recommend signing up for the patient portal called "MyChart".  Sign up information is provided on this After Visit Summary.  MyChart is used to connect with patients for Virtual Visits  (Telemedicine).  Patients are able to view lab/test results, encounter notes, upcoming appointments, etc.  Non-urgent messages can be sent to your provider as well.   To learn more about what you can do with MyChart, go to [email or web address].    Your next appointment:   3 month(s)  The format for your next appointment:   In Person  Provider:   You may see Dr Harl Bowie or one of the following Advanced Practice Providers on your designated Care Team:    [place], PA-C   Ermalinda Barrios, [place]     Other Instructions Two Gram Sodium Diet 2000 mg  What is Sodium? Sodium is a mineral found naturally in many foods. The most significant source of sodium in the diet is table salt, which is about 40% sodium.  Processed, convenience, and preserved foods also contain a large amount of sodium.  The body needs only 500 mg of sodium daily to function,  A normal diet provides more than enough sodium even if you do not use salt.  Why Limit Sodium? A build up of sodium in the body can cause thirst, increased blood pressure, shortness of breath, and water retention.  Decreasing sodium in the diet can reduce edema and risk of heart attack or stroke associated with  high blood pressure.  Keep in mind that there are many other factors involved in these health problems.  Heredity, obesity, lack of exercise, cigarette smoking, stress and what you eat all play a role.  General Guidelines:  Do not add salt at the table or in cooking.  One teaspoon of salt contains over 2 grams of sodium.  Read food labels  Avoid processed and convenience foods  Ask your dietitian before eating any foods not dicussed in the menu planning guidelines  Consult your physician if you wish to use a salt substitute or a sodium containing medication such as antacids.  Limit milk and milk products to 16 oz (2 cups) per day.  Shopping Hints:  READ LABELS!! "Dietetic" does not necessarily mean low sodium.  Salt and other  sodium ingredients are often added to foods during processing.   Menu Planning Guidelines Food Group Choose More Often Avoid  Beverages (see also the milk group All fruit juices, low-sodium, salt-free vegetables juices, low-sodium carbonated beverages Regular vegetable or tomato juices, commercially softened water used for drinking or cooking  Breads and Cereals Enriched white, wheat, rye and pumpernickel bread, hard rolls and dinner rolls; muffins, cornbread and waffles; most dry cereals, cooked cereal without added salt; unsalted crackers and breadsticks; low sodium or homemade bread crumbs Bread, rolls and crackers with salted tops; quick breads; instant hot cereals; pancakes; commercial bread stuffing; self-rising flower and biscuit mixes; regular bread crumbs or cracker crumbs  Desserts and Sweets Desserts and sweets mad with mild should be within allowance Instant pudding mixes and cake mixes  Fats Butter or margarine; vegetable oils; unsalted salad dressings, regular salad dressings limited to 1 Tbs; light, sour and heavy cream Regular salad dressings containing bacon fat, bacon bits, and salt pork; snack dips made with instant soup mixes or processed cheese; salted nuts  Fruits Most fresh, frozen and canned fruits Fruits processed with salt or sodium-containing ingredient (some dried fruits are processed with sodium sulfites        Vegetables Fresh, frozen vegetables and low- sodium canned vegetables Regular canned vegetables, sauerkraut, pickled vegetables, and others prepared in brine; frozen vegetables in sauces; vegetables seasoned with ham, bacon or salt pork  Condiments, Sauces, Miscellaneous  Salt substitute with physician's approval; pepper, herbs, spices; vinegar, lemon or lime juice; hot pepper sauce; garlic powder, onion powder, low sodium soy sauce (1 Tbs.); low sodium condiments (ketchup, chili sauce, mustard) in limited amounts (1 tsp.) fresh ground horseradish; unsalted  tortilla chips, pretzels, potato chips, popcorn, salsa (1/4 cup) Any seasoning made with salt including garlic salt, celery salt, onion salt, and seasoned salt; sea salt, rock salt, kosher salt; meat tenderizers; monosodium glutamate; mustard, regular soy sauce, barbecue, sauce, chili sauce, teriyaki sauce, steak sauce, Worcestershire sauce, and most flavored vinegars; canned gravy and mixes; regular condiments; salted snack foods, olives, picles, relish, horseradish sauce, catsup   Food preparation: Try these seasonings Meats:    Pork Sage, onion Serve with applesauce  Chicken Poultry seasoning, thyme, parsley Serve with cranberry sauce  Lamb Curry powder, rosemary, garlic, thyme Serve with mint sauce or jelly  Veal Marjoram, basil Serve with current jelly, cranberry sauce  Beef Pepper, bay leaf Serve with dry mustard, unsalted chive butter  Fish [place] leaf, dill Serve with unsalted lemon butter, unsalted parsley butter  Vegetables:    Asparagus Lemon juice   Broccoli Lemon juice   Carrots Mustard dressing parsley, mint, nutmeg, glazed with unsalted butter and sugar   Green beans Marjoram,  lemon juice, nutmeg,dill seed   Tomatoes Basil, marjoram, onion   Spice /blend for [employer]" 4 tsp ground thyme 1 tsp ground sage 3 tsp ground rosemary 4 tsp ground marjoram   Test your knowledge 1. A product that says "Salt Free" may still contain sodium. True or False 2. Garlic Powder and Hot Pepper Sauce an be used as alternative seasonings.True or False 3. Processed foods have more sodium than fresh foods.  True or False 4. Canned Vegetables have less sodium than froze True or False  WAYS TO DECREASE YOUR SODIUM INTAKE 1. Avoid the use of added salt in cooking and at the table.  Table salt (and other prepared seasonings which contain salt) is probably one of the greatest sources of sodium in the diet.  Unsalted foods can gain flavor from the sweet, sour, and butter taste sensations of herbs and  spices.  Instead of using salt for seasoning, try the following seasonings with the foods listed.  Remember: how you use them to enhance natural food flavors is limited only by your creativity... Allspice-Meat, fish, eggs, fruit, peas, red and yellow vegetables Almond Extract-Fruit baked goods Anise Seed-Sweet breads, fruit, carrots, beets, cottage cheese, cookies (tastes like licorice) Basil-Meat, fish, eggs, vegetables, rice, vegetables salads, soups, sauces Bay Leaf-Meat, fish, stews, poultry Burnet-Salad, vegetables (cucumber-like flavor) Caraway Seed-Bread, cookies, cottage cheese, meat, vegetables, cheese, rice Cardamon-Baked goods, fruit, soups Celery Powder or seed-Salads, salad dressings, sauces, meatloaf, soup, bread.Do not use  celery salt Chervil-Meats, salads, fish, eggs, vegetables, cottage cheese (parsley-like flavor) Chili Power-Meatloaf, chicken cheese, corn, eggplant, egg dishes Chives-Salads cottage cheese, egg dishes, soups, vegetables, sauces Cilantro-Salsa, casseroles Cinnamon-Baked goods, fruit, pork, lamb, chicken, carrots Cloves-Fruit, baked goods, fish, pot roast, green beans, beets, carrots Coriander-Pastry, cookies, meat, salads, cheese (lemon-orange flavor) Cumin-Meatloaf, fish,cheese, eggs, cabbage,fruit pie (caraway flavor) [employer], fruit, eggs, fish, poultry, cottage cheese, vegetables Dill Seed-Meat, cottage cheese, poultry, vegetables, fish, salads, bread Fennel Seed-Bread, cookies, apples, pork, eggs, fish, beets, cabbage, cheese, Licorice-like flavor Garlic-(buds or powder) Salads, meat, poultry, fish, bread, butter, vegetables, potatoes.Do not  use garlic salt Ginger-Fruit, vegetables, baked goods, meat, fish, poultry Horseradish Root-Meet, vegetables, butter Lemon Juice or Extract-Vegetables, fruit, tea, baked goods, fish salads Mace-Baked goods fruit, vegetables, fish, poultry (taste like nutmeg) Maple Extract-Syrups Marjoram-Meat, chicken,  fish, vegetables, breads, green salads (taste like Sage) Mint-Tea, lamb, sherbet, vegetables, desserts, carrots, cabbage Mustard, Dry or Seed-Cheese, eggs, meats, vegetables, poultry Nutmeg-Baked goods, fruit, chicken, eggs, vegetables, desserts Onion Powder-Meat, fish, poultry, vegetables, cheese, eggs, bread, rice salads (Do not use   Onion salt) Orange Extract-Desserts, baked goods Oregano-Pasta, eggs, cheese, onions, pork, lamb, fish, chicken, vegetables, green salads Paprika-Meat, fish, poultry, eggs, cheese, vegetables Parsley Flakes-Butter, vegetables, meat fish, poultry, eggs, bread, salads (certain forms may   Contain sodium Pepper-Meat fish, poultry, vegetables, eggs Peppermint Extract-Desserts, baked goods Poppy Seed-Eggs, bread, cheese, fruit dressings, baked goods, noodles, vegetables, cottage  [employer], poultry, meat, fish, cauliflower, turnips,eggs bread Saffron-Rice, bread, veal, chicken, fish, eggs Sage-Meat, fish, poultry, onions, eggplant, tomateos, pork, stews Savory-Eggs, salads, poultry, meat, rice, vegetables, soups, pork Tarragon-Meat, poultry, fish, eggs, butter, vegetables (licorice-like flavor)  Thyme-Meat, poultry, fish, eggs, vegetables, (clover-like flavor), sauces, soups Tumeric-Salads, butter, eggs, fish, rice, vegetables (saffron-like flavor) Vanilla Extract-Baked goods, candy Vinegar-Salads, vegetables, meat marinades Walnut Extract-baked goods, candy  2. Choose your Foods Wisely   The following is a list of foods to avoid which are high in sodium:  Meats-Avoid all smoked, canned, salt cured, dried and  kosher meat and fish as well as Anchovies   [employer] meats:Bologna, Liverwurst, Pastrami Canned meat or fish  Marinated herring Caviar    Pepperoni Corned Beef   Pizza Dried chipped beef  Salami Frozen breaded fish or meat Salt pork Frankfurters or hot dogs  Sardines Gefilte  fish   Sausage Ham (boiled ham, Proscuitto Smoked butt    spiced ham)   Spam      TV Dinners Vegetables Canned vegetables (Regular) Relish Canned mushrooms  Sauerkraut Olives    Tomato juice Pickles  Bakery and Dessert Products Canned puddings  Cream pies Cheesecake   Decorated cakes Cookies  Beverages/Juices Tomato juice, regular  Gatorade   V-8 vegetable juice, regular  Breads and Cereals Biscuit mixes   Salted potato chips, corn chips, pretzels Bread stuffing mixes  Salted crackers and rolls Pancake and waffle mixes Self-rising flour  Seasonings Accent    Meat sauces Barbecue sauce  Meat tenderizer Catsup    Monosodium glutamate (MSG) Celery salt   Onion salt Chili sauce   Prepared mustard Garlic salt   Salt, seasoned salt, sea salt Gravy mixes   Soy sauce Horseradish   Steak sauce Ketchup   Tartar sauce Lite salt    Teriyaki sauce Marinade mixes   Worcestershire sauce  Others Baking powder   Cocoa and cocoa mixes Baking soda   Commercial casserole mixes Candy-caramels, chocolate  Dehydrated soups    Bars, fudge,nougats  Instant rice and pasta mixes Canned broth or soup  Maraschino cherries Cheese, aged and processed cheese and cheese spreads  Learning Assessment Quiz  Indicated T (for True) or F (for False) for each of the following statements:  1. _____ Fresh fruits and vegetables and unprocessed grains are generally low in sodium 2. _____ Water may contain a considerable amount of sodium, depending on the source 3. _____ You can always tell if a food is high in sodium by tasting it 4. _____ Certain laxatives my be high in sodium and should be avoided unless prescribed   by a physician or pharmacist 5. _____ Salt substitutes may be used freely by anyone on a sodium restricted diet 6. _____ Sodium is present in table salt, food additives and as a natural component of   most foods 7. _____ Table salt is approximately 90% sodium 8. _____ Limiting sodium  intake may help prevent excess fluid accumulation in the body 9. _____ On a sodium-restricted diet, seasonings such as bouillon soy sauce, and    cooking wine should be used in place of table salt 10. _____ On an ingredient list, a product which lists monosodium glutamate as the first   ingredient is an appropriate food to include on a low sodium diet  Circle the best answer(s) to the following statements (Hint: there may be more than one correct answer)  11. On a low-sodium diet, some acceptable snack items are:    A. Olives  F. Bean dip   K. Grapefruit juice    B. Salted Pretzels G. Commercial Popcorn   L. Canned peaches    C. Carrot Sticks  H. Bouillon   M. Unsalted nuts   D. [place] fries  I. Peanut butter crackers N. Salami   E. Sweet pickles J. Tomato Juice   O. Pizza  12.  Seasonings that may be used freely on a reduced - sodium diet include   A. Lemon wedges F.Monosodium glutamate K. Celery seed    B.Soysauce   G. Pepper  L. Mustard powder   C. Sea salt  H. Cooking wine  M. Onion flakes   D. Vinegar  E. Prepared horseradish N. Salsa   E. Sage   J. Worcestershire sauce  O. Chutney      Signed, Ermalinda Barrios, PA-C  11/24/2020 1:49 PM    [facility] [place], [place], [place]  [id number] Phone: [phone]; Fax: [phone]

## 2020-11-24 ENCOUNTER — Encounter: Payer: Self-pay | Admitting: Physician Assistant

## 2020-11-24 ENCOUNTER — Ambulatory Visit (INDEPENDENT_AMBULATORY_CARE_PROVIDER_SITE_OTHER): Payer: Medicare Other | Admitting: Physician Assistant

## 2020-11-24 ENCOUNTER — Other Ambulatory Visit: Payer: Self-pay

## 2020-11-24 VITALS — BP 148/92 | HR 71 | Ht 73.0 in | Wt 167.2 lb

## 2020-11-24 DIAGNOSIS — Z950 Presence of cardiac pacemaker: Secondary | ICD-10-CM | POA: Diagnosis not present

## 2020-11-24 DIAGNOSIS — I5032 Chronic diastolic (congestive) heart failure: Secondary | ICD-10-CM

## 2020-11-24 DIAGNOSIS — E876 Hypokalemia: Secondary | ICD-10-CM

## 2020-11-24 DIAGNOSIS — E7849 Other hyperlipidemia: Secondary | ICD-10-CM | POA: Diagnosis not present

## 2020-11-24 DIAGNOSIS — I2510 Atherosclerotic heart disease of native coronary artery without angina pectoris: Secondary | ICD-10-CM | POA: Diagnosis not present

## 2020-11-24 DIAGNOSIS — I428 Other cardiomyopathies: Secondary | ICD-10-CM | POA: Diagnosis not present

## 2020-11-24 DIAGNOSIS — I10 Essential (primary) hypertension: Secondary | ICD-10-CM

## 2020-11-24 DIAGNOSIS — I480 Paroxysmal atrial fibrillation: Secondary | ICD-10-CM | POA: Diagnosis not present

## 2020-11-24 MED ORDER — POTASSIUM CHLORIDE CRYS ER 20 MEQ PO TBCR: EXTENDED_RELEASE_TABLET | ORAL | 3 refills | Status: DC

## 2020-11-24 NOTE — Patient Instructions (Signed)
Medication Instructions:  Your physician recommends that you continue on your current medications as directed. Please refer to the Current Medication list given to you today.  *If you need a refill on your cardiac medications before your next appointment, please call your pharmacy*   Lab Work: None If you have labs (blood work) drawn today and your tests are completely normal, you will receive your results only by:  [place] (if you have MyChart) OR  A paper copy in the mail If you have any lab test that is abnormal or we need to change your treatment, we will call you to review the results.   Testing/Procedures: None   Follow-Up: At [facility], you and your health needs are our priority.  As part of our continuing mission to provide you with exceptional heart care, we have created designated Provider Care Teams.  These Care Teams include your primary Cardiologist (physician) and Advanced Practice Providers (APPs -  Physician Assistants and Nurse Practitioners) who all work together to provide you with the care you need, when you need it.  We recommend signing up for the patient portal called "MyChart".  Sign up information is provided on this After Visit Summary.  MyChart is used to connect with patients for Virtual Visits (Telemedicine).  Patients are able to view lab/test results, encounter notes, upcoming appointments, etc.  Non-urgent messages can be sent to your provider as well.   To learn more about what you can do with MyChart, go to [email or web address].    Your next appointment:   3 month(s)  The format for your next appointment:   In Person  Provider:   You may see Dr Harl Bowie or one of the following Advanced Practice Providers on your designated Care Team:    [place], PA-C   Ermalinda Barrios, [place]     Other Instructions Two Gram Sodium Diet 2000 mg  What is Sodium? Sodium is a mineral found naturally in many foods. The most significant source  of sodium in the diet is table salt, which is about 40% sodium.  Processed, convenience, and preserved foods also contain a large amount of sodium.  The body needs only 500 mg of sodium daily to function,  A normal diet provides more than enough sodium even if you do not use salt.  Why Limit Sodium? A build up of sodium in the body can cause thirst, increased blood pressure, shortness of breath, and water retention.  Decreasing sodium in the diet can reduce edema and risk of heart attack or stroke associated with high blood pressure.  Keep in mind that there are many other factors involved in these health problems.  Heredity, obesity, lack of exercise, cigarette smoking, stress and what you eat all play a role.  General Guidelines:  Do not add salt at the table or in cooking.  One teaspoon of salt contains over 2 grams of sodium.  Read food labels  Avoid processed and convenience foods  Ask your dietitian before eating any foods not dicussed in the menu planning guidelines  Consult your physician if you wish to use a salt substitute or a sodium containing medication such as antacids.  Limit milk and milk products to 16 oz (2 cups) per day.  Shopping Hints:  READ LABELS!! "Dietetic" does not necessarily mean low sodium.  Salt and other sodium ingredients are often added to foods during processing.   Menu Planning Guidelines Food Group Choose More Often Avoid  Beverages (see also the milk group  All fruit juices, low-sodium, salt-free vegetables juices, low-sodium carbonated beverages Regular vegetable or tomato juices, commercially softened water used for drinking or cooking  Breads and Cereals Enriched white, wheat, rye and pumpernickel bread, hard rolls and dinner rolls; muffins, cornbread and waffles; most dry cereals, cooked cereal without added salt; unsalted crackers and breadsticks; low sodium or homemade bread crumbs Bread, rolls and crackers with salted tops; quick breads; instant  hot cereals; pancakes; commercial bread stuffing; self-rising flower and biscuit mixes; regular bread crumbs or cracker crumbs  Desserts and Sweets Desserts and sweets mad with mild should be within allowance Instant pudding mixes and cake mixes  Fats Butter or margarine; vegetable oils; unsalted salad dressings, regular salad dressings limited to 1 Tbs; light, sour and heavy cream Regular salad dressings containing bacon fat, bacon bits, and salt pork; snack dips made with instant soup mixes or processed cheese; salted nuts  Fruits Most fresh, frozen and canned fruits Fruits processed with salt or sodium-containing ingredient (some dried fruits are processed with sodium sulfites        Vegetables Fresh, frozen vegetables and low- sodium canned vegetables Regular canned vegetables, sauerkraut, pickled vegetables, and others prepared in brine; frozen vegetables in sauces; vegetables seasoned with ham, bacon or salt pork  Condiments, Sauces, Miscellaneous  Salt substitute with physician's approval; pepper, herbs, spices; vinegar, lemon or lime juice; hot pepper sauce; garlic powder, onion powder, low sodium soy sauce (1 Tbs.); low sodium condiments (ketchup, chili sauce, mustard) in limited amounts (1 tsp.) fresh ground horseradish; unsalted tortilla chips, pretzels, potato chips, popcorn, salsa (1/4 cup) Any seasoning made with salt including garlic salt, celery salt, onion salt, and seasoned salt; sea salt, rock salt, kosher salt; meat tenderizers; monosodium glutamate; mustard, regular soy sauce, barbecue, sauce, chili sauce, teriyaki sauce, steak sauce, Worcestershire sauce, and most flavored vinegars; canned gravy and mixes; regular condiments; salted snack foods, olives, picles, relish, horseradish sauce, catsup   Food preparation: Try these seasonings Meats:    Pork Sage, onion Serve with applesauce  Chicken Poultry seasoning, thyme, parsley Serve with cranberry sauce  Lamb Curry powder,  rosemary, garlic, thyme Serve with mint sauce or jelly  Veal Marjoram, basil Serve with current jelly, cranberry sauce  Beef Pepper, bay leaf Serve with dry mustard, unsalted chive butter  Fish [place] leaf, dill Serve with unsalted lemon butter, unsalted parsley butter  Vegetables:    Asparagus Lemon juice   Broccoli Lemon juice   Carrots Mustard dressing parsley, mint, nutmeg, glazed with unsalted butter and sugar   Green beans Marjoram, lemon juice, nutmeg,dill seed   Tomatoes Basil, marjoram, onion   Spice /blend for [employer]" 4 tsp ground thyme 1 tsp ground sage 3 tsp ground rosemary 4 tsp ground marjoram   Test your knowledge 1. A product that says "Salt Free" may still contain sodium. True or False 2. Garlic Powder and Hot Pepper Sauce an be used as alternative seasonings.True or False 3. Processed foods have more sodium than fresh foods.  True or False 4. Canned Vegetables have less sodium than froze True or False  WAYS TO DECREASE YOUR SODIUM INTAKE 1. Avoid the use of added salt in cooking and at the table.  Table salt (and other prepared seasonings which contain salt) is probably one of the greatest sources of sodium in the diet.  Unsalted foods can gain flavor from the sweet, sour, and butter taste sensations of herbs and spices.  Instead of using salt for seasoning, try the following seasonings  with the foods listed.  Remember: how you use them to enhance natural food flavors is limited only by your creativity... Allspice-Meat, fish, eggs, fruit, peas, red and yellow vegetables Almond Extract-Fruit baked goods Anise Seed-Sweet breads, fruit, carrots, beets, cottage cheese, cookies (tastes like licorice) Basil-Meat, fish, eggs, vegetables, rice, vegetables salads, soups, sauces Bay Leaf-Meat, fish, stews, poultry Burnet-Salad, vegetables (cucumber-like flavor) Caraway Seed-Bread, cookies, cottage cheese, meat, vegetables, cheese, rice Cardamon-Baked goods, fruit,  soups Celery Powder or seed-Salads, salad dressings, sauces, meatloaf, soup, bread.Do not use  celery salt Chervil-Meats, salads, fish, eggs, vegetables, cottage cheese (parsley-like flavor) Chili Power-Meatloaf, chicken cheese, corn, eggplant, egg dishes Chives-Salads cottage cheese, egg dishes, soups, vegetables, sauces Cilantro-Salsa, casseroles Cinnamon-Baked goods, fruit, pork, lamb, chicken, carrots Cloves-Fruit, baked goods, fish, pot roast, green beans, beets, carrots Coriander-Pastry, cookies, meat, salads, cheese (lemon-orange flavor) Cumin-Meatloaf, fish,cheese, eggs, cabbage,fruit pie (caraway flavor) [employer], fruit, eggs, fish, poultry, cottage cheese, vegetables Dill Seed-Meat, cottage cheese, poultry, vegetables, fish, salads, bread Fennel Seed-Bread, cookies, apples, pork, eggs, fish, beets, cabbage, cheese, Licorice-like flavor Garlic-(buds or powder) Salads, meat, poultry, fish, bread, butter, vegetables, potatoes.Do not  use garlic salt Ginger-Fruit, vegetables, baked goods, meat, fish, poultry Horseradish Root-Meet, vegetables, butter Lemon Juice or Extract-Vegetables, fruit, tea, baked goods, fish salads Mace-Baked goods fruit, vegetables, fish, poultry (taste like nutmeg) Maple Extract-Syrups Marjoram-Meat, chicken, fish, vegetables, breads, green salads (taste like Sage) Mint-Tea, lamb, sherbet, vegetables, desserts, carrots, cabbage Mustard, Dry or Seed-Cheese, eggs, meats, vegetables, poultry Nutmeg-Baked goods, fruit, chicken, eggs, vegetables, desserts Onion Powder-Meat, fish, poultry, vegetables, cheese, eggs, bread, rice salads (Do not use   Onion salt) Orange Extract-Desserts, baked goods Oregano-Pasta, eggs, cheese, onions, pork, lamb, fish, chicken, vegetables, green salads Paprika-Meat, fish, poultry, eggs, cheese, vegetables Parsley Flakes-Butter, vegetables, meat fish, poultry, eggs, bread, salads (certain forms may   Contain  sodium Pepper-Meat fish, poultry, vegetables, eggs Peppermint Extract-Desserts, baked goods Poppy Seed-Eggs, bread, cheese, fruit dressings, baked goods, noodles, vegetables, cottage  [employer], poultry, meat, fish, cauliflower, turnips,eggs bread Saffron-Rice, bread, veal, chicken, fish, eggs Sage-Meat, fish, poultry, onions, eggplant, tomateos, pork, stews Savory-Eggs, salads, poultry, meat, rice, vegetables, soups, pork Tarragon-Meat, poultry, fish, eggs, butter, vegetables (licorice-like flavor)  Thyme-Meat, poultry, fish, eggs, vegetables, (clover-like flavor), sauces, soups Tumeric-Salads, butter, eggs, fish, rice, vegetables (saffron-like flavor) Vanilla Extract-Baked goods, candy Vinegar-Salads, vegetables, meat marinades Walnut Extract-baked goods, candy  2. Choose your Foods Wisely   The following is a list of foods to avoid which are high in sodium:  Meats-Avoid all smoked, canned, salt cured, dried and kosher meat and fish as well as Anchovies   [employer] meats:Bologna, Liverwurst, Pastrami Canned meat or fish  Marinated herring Caviar    Pepperoni Corned Beef   Pizza Dried chipped beef  Salami Frozen breaded fish or meat Salt pork Frankfurters or hot dogs  Sardines Gefilte fish   Sausage Ham (boiled ham, Proscuitto Smoked butt    spiced ham)   Spam      TV Dinners Vegetables Canned vegetables (Regular) Relish Canned mushrooms  Sauerkraut Olives    Tomato juice Pickles  Bakery and Dessert Products Canned puddings  Cream pies Cheesecake   Decorated cakes Cookies  Beverages/Juices Tomato juice, regular  Gatorade   V-8 vegetable juice, regular  Breads and Cereals Biscuit mixes   Salted potato chips, corn chips, pretzels Bread stuffing mixes  Salted crackers and rolls Pancake and waffle mixes Self-rising flour  Seasonings Accent    Meat sauces Barbecue  sauce  Meat tenderizer Catsup    Monosodium  glutamate (MSG) Celery salt   Onion salt Chili sauce   Prepared mustard Garlic salt   Salt, seasoned salt, sea salt Gravy mixes   Soy sauce Horseradish   Steak sauce Ketchup   Tartar sauce Lite salt    Teriyaki sauce Marinade mixes   Worcestershire sauce  Others Baking powder   Cocoa and cocoa mixes Baking soda   Commercial casserole mixes Candy-caramels, chocolate  Dehydrated soups    Bars, fudge,nougats  Instant rice and pasta mixes Canned broth or soup  Maraschino cherries Cheese, aged and processed cheese and cheese spreads  Learning Assessment Quiz  Indicated T (for True) or F (for False) for each of the following statements:  1. _____ Fresh fruits and vegetables and unprocessed grains are generally low in sodium 2. _____ Water may contain a considerable amount of sodium, depending on the source 3. _____ You can always tell if a food is high in sodium by tasting it 4. _____ Certain laxatives my be high in sodium and should be avoided unless prescribed   by a physician or pharmacist 5. _____ Salt substitutes may be used freely by anyone on a sodium restricted diet 6. _____ Sodium is present in table salt, food additives and as a natural component of   most foods 7. _____ Table salt is approximately 90% sodium 8. _____ Limiting sodium intake may help prevent excess fluid accumulation in the body 9. _____ On a sodium-restricted diet, seasonings such as bouillon soy sauce, and    cooking wine should be used in place of table salt 10. _____ On an ingredient list, a product which lists monosodium glutamate as the first   ingredient is an appropriate food to include on a low sodium diet  Circle the best answer(s) to the following statements (Hint: there may be more than one correct answer)  11. On a low-sodium diet, some acceptable snack items are:    A. Olives  F. Bean dip   K. Grapefruit juice    B. Salted Pretzels G. Commercial Popcorn   L. Canned peaches    C. Carrot  Sticks  H. Bouillon   M. Unsalted nuts   D. [place] fries  I. Peanut butter crackers N. Salami   E. Sweet pickles J. Tomato Juice   O. Pizza  12.  Seasonings that may be used freely on a reduced - sodium diet include   A. Lemon wedges F.Monosodium glutamate K. Celery seed    B.Soysauce   G. Pepper   L. Mustard powder   C. Sea salt  H. Cooking wine  M. Onion flakes   D. Vinegar  E. Prepared horseradish N. Salsa   E. Sage   J. Worcestershire sauce  O. Chutney

## 2020-11-25 DIAGNOSIS — M6281 Muscle weakness (generalized): Secondary | ICD-10-CM | POA: Diagnosis not present

## 2020-11-25 DIAGNOSIS — R2681 Unsteadiness on feet: Secondary | ICD-10-CM | POA: Diagnosis not present

## 2020-11-25 DIAGNOSIS — M5459 Other low back pain: Secondary | ICD-10-CM | POA: Diagnosis not present

## 2020-11-25 DIAGNOSIS — M25569 Pain in unspecified knee: Secondary | ICD-10-CM | POA: Diagnosis not present

## 2020-11-26 DIAGNOSIS — R413 Other amnesia: Secondary | ICD-10-CM | POA: Diagnosis not present

## 2020-12-02 ENCOUNTER — Other Ambulatory Visit: Payer: Self-pay

## 2020-12-02 ENCOUNTER — Encounter (INDEPENDENT_AMBULATORY_CARE_PROVIDER_SITE_OTHER): Payer: Medicare Other | Admitting: Ophthalmology

## 2020-12-02 DIAGNOSIS — I10 Essential (primary) hypertension: Secondary | ICD-10-CM

## 2020-12-02 DIAGNOSIS — H353132 Nonexudative age-related macular degeneration, bilateral, intermediate dry stage: Secondary | ICD-10-CM

## 2020-12-02 DIAGNOSIS — H35033 Hypertensive retinopathy, bilateral: Secondary | ICD-10-CM | POA: Diagnosis not present

## 2020-12-02 DIAGNOSIS — H43813 Vitreous degeneration, bilateral: Secondary | ICD-10-CM

## 2020-12-02 DIAGNOSIS — H34832 Tributary (branch) retinal vein occlusion, left eye, with macular edema: Secondary | ICD-10-CM

## 2020-12-04 DIAGNOSIS — E7800 Pure hypercholesterolemia, unspecified: Secondary | ICD-10-CM | POA: Diagnosis not present

## 2020-12-04 DIAGNOSIS — Z6823 Body mass index (BMI) 23.0-23.9, adult: Secondary | ICD-10-CM | POA: Diagnosis not present

## 2020-12-04 DIAGNOSIS — Z0000 Encounter for general adult medical examination without abnormal findings: Secondary | ICD-10-CM | POA: Diagnosis not present

## 2020-12-04 DIAGNOSIS — Z79899 Other long term (current) drug therapy: Secondary | ICD-10-CM | POA: Diagnosis not present

## 2020-12-04 DIAGNOSIS — Z7189 Other specified counseling: Secondary | ICD-10-CM | POA: Diagnosis not present

## 2020-12-04 DIAGNOSIS — Z299 Encounter for prophylactic measures, unspecified: Secondary | ICD-10-CM | POA: Diagnosis not present

## 2020-12-04 DIAGNOSIS — E039 Hypothyroidism, unspecified: Secondary | ICD-10-CM | POA: Diagnosis not present

## 2020-12-04 DIAGNOSIS — Z789 Other specified health status: Secondary | ICD-10-CM | POA: Diagnosis not present

## 2020-12-04 DIAGNOSIS — Z1331 Encounter for screening for depression: Secondary | ICD-10-CM | POA: Diagnosis not present

## 2020-12-04 DIAGNOSIS — Z1339 Encounter for screening examination for other mental health and behavioral disorders: Secondary | ICD-10-CM | POA: Diagnosis not present

## 2020-12-04 DIAGNOSIS — Z125 Encounter for screening for malignant neoplasm of prostate: Secondary | ICD-10-CM | POA: Diagnosis not present

## 2020-12-04 DIAGNOSIS — R5383 Other fatigue: Secondary | ICD-10-CM | POA: Diagnosis not present

## 2020-12-04 DIAGNOSIS — I10 Essential (primary) hypertension: Secondary | ICD-10-CM | POA: Diagnosis not present

## 2020-12-05 ENCOUNTER — Other Ambulatory Visit: Payer: Self-pay | Admitting: Cardiology

## 2020-12-08 ENCOUNTER — Telehealth: Payer: Self-pay | Admitting: Cardiology

## 2020-12-08 NOTE — Telephone Encounter (Signed)
Error

## 2020-12-10 DIAGNOSIS — E1165 Type 2 diabetes mellitus with hyperglycemia: Secondary | ICD-10-CM | POA: Diagnosis not present

## 2020-12-10 DIAGNOSIS — D6869 Other thrombophilia: Secondary | ICD-10-CM | POA: Diagnosis not present

## 2020-12-10 DIAGNOSIS — Z299 Encounter for prophylactic measures, unspecified: Secondary | ICD-10-CM | POA: Diagnosis not present

## 2020-12-10 DIAGNOSIS — F322 Major depressive disorder, single episode, severe without psychotic features: Secondary | ICD-10-CM | POA: Diagnosis not present

## 2020-12-10 DIAGNOSIS — F0390 Unspecified dementia without behavioral disturbance: Secondary | ICD-10-CM | POA: Diagnosis not present

## 2020-12-10 DIAGNOSIS — I10 Essential (primary) hypertension: Secondary | ICD-10-CM | POA: Diagnosis not present

## 2020-12-24 DIAGNOSIS — I10 Essential (primary) hypertension: Secondary | ICD-10-CM | POA: Diagnosis not present

## 2021-01-11 ENCOUNTER — Ambulatory Visit (INDEPENDENT_AMBULATORY_CARE_PROVIDER_SITE_OTHER): Payer: Medicare Other

## 2021-01-11 DIAGNOSIS — I5032 Chronic diastolic (congestive) heart failure: Secondary | ICD-10-CM

## 2021-01-11 DIAGNOSIS — I428 Other cardiomyopathies: Secondary | ICD-10-CM

## 2021-01-13 ENCOUNTER — Encounter: Payer: Medicare Other | Admitting: Internal Medicine

## 2021-01-13 LAB — CUP PACEART REMOTE DEVICE CHECK
Battery Remaining Longevity: 107 mo
Battery Remaining Percentage: 89 %
Battery Voltage: 2.95 V
Brady Statistic RV Percent Paced: 46 %
Date Time Interrogation Session: 20220117052339
Implantable Lead Implant Date: 20140919
Implantable Lead Implant Date: 20140919
Implantable Lead Location: 753859
Implantable Lead Location: 753860
Implantable Pulse Generator Implant Date: 20140919
Lead Channel Impedance Value: 390 Ohm
Lead Channel Pacing Threshold Amplitude: 0.75 V
Lead Channel Pacing Threshold Pulse Width: 0.5 ms
Lead Channel Sensing Intrinsic Amplitude: 4.6 mV
Lead Channel Setting Pacing Amplitude: 2.5 V
Lead Channel Setting Pacing Pulse Width: 0.5 ms
Lead Channel Setting Sensing Sensitivity: 2 mV
Pulse Gen Model: 2240
Pulse Gen Serial Number: 7536055

## 2021-01-20 DIAGNOSIS — I429 Cardiomyopathy, unspecified: Secondary | ICD-10-CM | POA: Diagnosis not present

## 2021-01-20 DIAGNOSIS — F0390 Unspecified dementia without behavioral disturbance: Secondary | ICD-10-CM | POA: Diagnosis not present

## 2021-01-20 DIAGNOSIS — I10 Essential (primary) hypertension: Secondary | ICD-10-CM | POA: Diagnosis not present

## 2021-01-20 DIAGNOSIS — Z299 Encounter for prophylactic measures, unspecified: Secondary | ICD-10-CM | POA: Diagnosis not present

## 2021-01-20 DIAGNOSIS — I2510 Atherosclerotic heart disease of native coronary artery without angina pectoris: Secondary | ICD-10-CM | POA: Diagnosis not present

## 2021-01-25 DIAGNOSIS — I10 Essential (primary) hypertension: Secondary | ICD-10-CM | POA: Diagnosis not present

## 2021-01-26 ENCOUNTER — Ambulatory Visit (INDEPENDENT_AMBULATORY_CARE_PROVIDER_SITE_OTHER): Payer: Medicare Other | Admitting: Internal Medicine

## 2021-01-26 ENCOUNTER — Other Ambulatory Visit: Payer: Self-pay

## 2021-01-26 ENCOUNTER — Encounter: Payer: Self-pay | Admitting: Internal Medicine

## 2021-01-26 VITALS — BP 140/96 | HR 78 | Ht 73.0 in | Wt 161.0 lb

## 2021-01-26 DIAGNOSIS — I480 Paroxysmal atrial fibrillation: Secondary | ICD-10-CM

## 2021-01-26 NOTE — Patient Instructions (Addendum)
Medication Instructions:  Your physician recommends that you continue on your current medications as directed. Please refer to the Current Medication list given to you today.  *If you need a refill on your cardiac medications before your next appointment, please call your pharmacy*   Lab Work: None Today If you have labs (blood work) drawn today and your tests are completely normal, you will receive your results only by: [place]land [place] MyChart Message (if you have MyChart) OR . A paper copy in the mail If you have any lab test that is abnormal or we need to change your treatment, we will call you to review the results.   Testing/Procedures: None Today   Follow-Up: At [facility], you and your health needs are our priority.  As part of our continuing mission to provide you with exceptional heart care, we have created designated Provider Care Teams.  These Care Teams include your primary Cardiologist (physician) and Advanced Practice Providers (APPs -  Physician Assistants and Nurse Practitioners) who all work together to provide you with the care you need, when you need it.  We recommend signing up for the patient portal called "MyChart".  Sign up information is provided on this After Visit Summary.  MyChart is used to connect with patients for Virtual Visits (Telemedicine).  Patients are able to view lab/test results, encounter notes, upcoming appointments, etc.  Non-urgent messages can be sent to your provider as well.   To learn more about what you can do with MyChart, go to [email or web address].    Your next appointment:   12 month(s)  The format for your next appointment:   In Person  Provider:   Cristopher Peru, MD   Other Instructions Eliquis 5 mg  Samples (#14)  Lot #: [id number] EXP: 03/2022

## 2021-01-26 NOTE — Progress Notes (Signed)
Remote pacemaker transmission.   

## 2021-01-26 NOTE — Progress Notes (Signed)
HPI Mr. Trembath returns today for follow-up of his atrial fibrillation and symptomatic bradycardia. He is an 87 year old man with a history of paroxysmal atrial fibrillation,symptomatic bradycardia, status post pacemaker insertion, hypertension, and chronic peripheral edema. He has been sedentary in the past secondary to chronic back pain.his blood pressure has been fairly well-controlled though a little high. Hehas been in atrial fib which is now chronic. He has been on Eliquis. His rates are well controlled. He has fallen once but did not injure himself. Allergies  Allergen Reactions  . Fentanyl Other (See Comments)    resp suppression per wife, out for 2 days      Current Outpatient Medications  Medication Sig Dispense Refill  . acetaminophen (TYLENOL) 500 MG tablet Take 500 mg by mouth every 6 (six) hours as needed for pain.    Marland Kitchen alendronate (FOSAMAX) 70 MG tablet Take 70 mg by mouth every 7 (seven) days. Take with a full glass of water on an empty stomach. On Mondays    . apixaban (ELIQUIS) 5 MG TABS tablet Take 1 tablet (5 mg total) by mouth 2 (two) times daily. 180 tablet 3  . Calcium Carbonate-Vitamin D (CALCIUM 600 + D PO) Take 600 mg by mouth 2 (two) times daily.    . dorzolamide-timolol (COSOPT) 22.3-6.8 MG/ML ophthalmic solution USE ONE DROP IN LEFT EYE TWICE DAILY    . enalapril (VASOTEC) 20 MG tablet Take 20 mg by mouth daily.    . fish oil-omega-3 fatty acids 1000 MG capsule Take 1 g by mouth 2 (two) times daily.    . furosemide (LASIX) 40 MG tablet Take 1 tablet (40 mg total) by mouth daily. 90 tablet 3  . isosorbide mononitrate (IMDUR) 30 MG 24 hr tablet Take 30 mg by mouth daily.    . metFORMIN (GLUCOPHAGE) 500 MG tablet Take 500 mg by mouth daily with breakfast.    . Multiple Vitamin (MULTIVITAMIN WITH MINERALS) TABS tablet Take 1 tablet by mouth daily.    . nitroGLYCERIN (NITROSTAT) 0.4 MG SL tablet Place 1 tablet (0.4 mg total) under the tongue every 5 (five)  minutes x 3 doses as needed for chest pain. 25 tablet 3  . potassium chloride SA (KLOR-CON) 20 MEQ tablet TAKE 1 TABLET BY MOUTH EVERY DAY 90 tablet 3  . pravastatin (PRAVACHOL) 20 MG tablet TAKE 1 TABLET (20 MG TOTAL) BY MOUTH EVERY EVENING. 90 tablet 3  . tamsulosin (FLOMAX) 0.4 MG CAPS capsule Take 0.4 mg by mouth daily.     . traMADol (ULTRAM) 50 MG tablet Take 50 mg by mouth every 6 (six) hours as needed.     . donepezil (ARICEPT) 5 MG tablet Take 10 mg by mouth daily.    Marland Kitchen escitalopram (LEXAPRO) 5 MG tablet Take 10 mg by mouth daily.     No current facility-administered medications for this visit.     Past Medical History:  Diagnosis Date  . Allergic rhinitis   . Anemia   . Anxiety   . Arthritis    "all over my body" (09/10/2013)  . BPH (benign prostatic hypertrophy)   . CAD (coronary artery disease)    a. 08/2013 LM nl, LAD 14m, D1 80ost, 95p, LCX 1m, RCA 20d, EF 60%.  . Chronic lower back pain   . Depression   . Diabetes mellitus    "diet controlled" (09/10/2013)  . Diaphragmatic hernia without mention of obstruction or gangrene   . Diastolic congestive heart failure ([place])   .  Elevated liver function tests   . Essential hypertension, benign   . Lacunar infarction (HCC)    a. 08/2013 post-cath, MRI: sm acute lacunar infarcts in LPCA and LSCA, ? tiny RPICA lacunar infarct.  . Obesity   . OSA on CPAP    "wears mask part of the time" (09/10/2013)  . Osteoporosis   . Sinus node dysfunction (HCC)    a. 08/2013 s/p SJM DC PPM.    ROS:   All systems reviewed and negative except as noted in the HPI.   Past Surgical History:  Procedure Laterality Date  . CARDIAC CATHETERIZATION  1998  . CATARACT EXTRACTION W/ INTRAOCULAR LENS  IMPLANT, BILATERAL  2010  . LEFT AND RIGHT HEART CATHETERIZATION WITH CORONARY ANGIOGRAM N/A 09/11/2013   Procedure: LEFT AND RIGHT HEART CATHETERIZATION WITH CORONARY ANGIOGRAM;  Surgeon: Iran Ouch, MD;  Location: [facility] CATH LAB;  Service:  Cardiovascular;  Laterality: N/A;  . PERMANENT PACEMAKER INSERTION N/A 09/13/2013   Procedure: PERMANENT PACEMAKER INSERTION;  Surgeon: Marinus Maw, MD;  Location[facility] CATH LAB;  Service: Cardiovascular;  Laterality: N/A;  . right hip fx  05/26/2016  . right wrist fx  05/26/2016  . STOMACH SURGERY  03/02/2012   ?volvus; "stomach came loose and twisted; went up under rib cage; had that corrected; tacked it up" (09/10/2013)     Family History  Problem Relation Age of Onset  . Hypertension Father   . Hypertension Mother      Social History   Socioeconomic History  . Marital status: Married    Spouse name: Not on file  . Number of children: 2  . Years of education: Not on file  . Highest education level: Not on file  Occupational History  . Occupation: Chief Executive Officer FOR [employer]    Comment: WORKED IN [employer]  Tobacco Use  . Smoking status: Never Smoker  . Smokeless tobacco: Never Used  Vaping Use  . Vaping Use: Never used  Substance and Sexual Activity  . Alcohol use: No    Alcohol/week: 0.0 standard drinks  . Drug use: No  . Sexual activity: Never  Other Topics Concern  . Not on file  Social History Narrative  . Not on file   Social Determinants of Health   Financial Resource Strain: Not on file  Food Insecurity: Not on file  Transportation Needs: Not on file  Physical Activity: Not on file  Stress: Not on file  Social Connections: Not on file  Intimate Partner Violence: Not on file     BP (!) 140/96   Pulse 78   Ht 6\' 1"  (1.854 m)   Wt 161 lb (73 kg)   SpO2 98%   BMI 21.24 kg/m   Physical Exam:  Frail appearing elderly man, NAD HEENT: Unremarkable Neck:  No JVD, no thyromegally Lymphatics:  No adenopathy Back:  No CVA tenderness Lungs:  Clear with no wheezes HEART:  Regular rate rhythm, no murmurs, no rubs, no clicks Abd:  soft, positive bowel sounds, no organomegally, no rebound, no guarding Ext:  2 plus pulses, no edema, no cyanosis, no  clubbing Skin:  No rashes no nodules Neuro:  CN II through XII intact, motor grossly intact  EKG - atrial fib with a controlled VR  DEVICE  Normal device function.  See PaceArt for details.   Assess/Plan: 1. Persistent atrial fib - his rate is controlled. He will continue his Eliquis.  2. Falls - I am concerned though he has done better in  the past few months.  3. HTN - his bp is not well controlled. However, I am not inclined to try and increase his meds out of concerns for his falling. 4. PPM - His [employer] VVI PM is working normally. He is pacing about 46% of the time.  Carleene Overlie Elvenia Godden,MD

## 2021-02-18 DIAGNOSIS — H0102B Squamous blepharitis left eye, upper and lower eyelids: Secondary | ICD-10-CM | POA: Diagnosis not present

## 2021-02-18 DIAGNOSIS — H401123 Primary open-angle glaucoma, left eye, severe stage: Secondary | ICD-10-CM | POA: Diagnosis not present

## 2021-02-18 DIAGNOSIS — H0102A Squamous blepharitis right eye, upper and lower eyelids: Secondary | ICD-10-CM | POA: Diagnosis not present

## 2021-02-18 DIAGNOSIS — E119 Type 2 diabetes mellitus without complications: Secondary | ICD-10-CM | POA: Diagnosis not present

## 2021-02-18 DIAGNOSIS — H348322 Tributary (branch) retinal vein occlusion, left eye, stable: Secondary | ICD-10-CM | POA: Diagnosis not present

## 2021-02-18 DIAGNOSIS — H401112 Primary open-angle glaucoma, right eye, moderate stage: Secondary | ICD-10-CM | POA: Diagnosis not present

## 2021-02-18 DIAGNOSIS — Z961 Presence of intraocular lens: Secondary | ICD-10-CM | POA: Diagnosis not present

## 2021-02-22 DIAGNOSIS — I10 Essential (primary) hypertension: Secondary | ICD-10-CM | POA: Diagnosis not present

## 2021-02-24 DIAGNOSIS — Z299 Encounter for prophylactic measures, unspecified: Secondary | ICD-10-CM | POA: Diagnosis not present

## 2021-02-24 DIAGNOSIS — E1165 Type 2 diabetes mellitus with hyperglycemia: Secondary | ICD-10-CM | POA: Diagnosis not present

## 2021-02-24 DIAGNOSIS — I429 Cardiomyopathy, unspecified: Secondary | ICD-10-CM | POA: Diagnosis not present

## 2021-02-24 DIAGNOSIS — I4891 Unspecified atrial fibrillation: Secondary | ICD-10-CM | POA: Diagnosis not present

## 2021-02-24 DIAGNOSIS — F0390 Unspecified dementia without behavioral disturbance: Secondary | ICD-10-CM | POA: Diagnosis not present

## 2021-02-24 DIAGNOSIS — I10 Essential (primary) hypertension: Secondary | ICD-10-CM | POA: Diagnosis not present

## 2021-03-25 DIAGNOSIS — I10 Essential (primary) hypertension: Secondary | ICD-10-CM | POA: Diagnosis not present

## 2021-04-01 ENCOUNTER — Ambulatory Visit (INDEPENDENT_AMBULATORY_CARE_PROVIDER_SITE_OTHER): Payer: Medicare Other | Admitting: Cardiology

## 2021-04-01 ENCOUNTER — Other Ambulatory Visit: Payer: Self-pay

## 2021-04-01 ENCOUNTER — Encounter: Payer: Self-pay | Admitting: Cardiology

## 2021-04-01 VITALS — BP 140/90 | HR 106 | Ht 73.0 in | Wt 157.4 lb

## 2021-04-01 DIAGNOSIS — I2510 Atherosclerotic heart disease of native coronary artery without angina pectoris: Secondary | ICD-10-CM

## 2021-04-01 DIAGNOSIS — Z950 Presence of cardiac pacemaker: Secondary | ICD-10-CM

## 2021-04-01 DIAGNOSIS — I4891 Unspecified atrial fibrillation: Secondary | ICD-10-CM

## 2021-04-01 DIAGNOSIS — I5032 Chronic diastolic (congestive) heart failure: Secondary | ICD-10-CM

## 2021-04-01 NOTE — Progress Notes (Signed)
Clinical Summary [name] is a 87 y.o.male seen today for follow up of the following medical problems.   1. Mild systolic dysfunction  - echo 07/2012 w/ LVEF 40-45%  - Cath 08/2013 w/ minimal CAD, LV gram 60% with normal filling pressures on RHC.   2018 echo LVEF 55-60%.   - changed HCTZ to lasix 40mg  daily due to LE edema - at 10/2020 PA visit swelling had improved  - swelling has improved. No SOB or DOE - compliatn with meds    2. HTN  - accepting higher bp's due to prior dizziness - last visit tried stopping norvasc as trial due to LE edema   3. Bradycardia  -s/p St Jude dual chamber pacemaker  - followed by EP  01/2021  normal device check.   4. Hyperlipidemia - labs followed by pcp  11/2020 TC 110 TG 46 HDL 54 LDL 45 - compliant with statin  5. Afib - no recent palpitations - no bleeding on eliquis.   6. CAD  - patent major arteries with evidence of disease in diags and OMs fromprior cath - no recent chest pain   SH: wife on chemo and radiationalong with recent hysterectomy, endometrialcancer.Just completed treatments, told she is cancer free    Past Medical History:  Diagnosis Date  . Allergic rhinitis   . Anemia   . Anxiety   . Arthritis    "all over my body" (09/10/2013)  . BPH (benign prostatic hypertrophy)   . CAD (coronary artery disease)    a. 08/2013 LM nl, LAD 57m, D1 80ost, 95p, LCX 20m, RCA 20d, EF 60%.  . Chronic lower back pain   . Depression   . Diabetes mellitus    "diet controlled" (09/10/2013)  . Diaphragmatic hernia without mention of obstruction or gangrene   . Diastolic congestive heart failure ([place])   . Elevated liver function tests   . Essential hypertension, benign   . Lacunar infarction ([place])    a. 08/2013 post-cath, MRI: sm acute lacunar infarcts in LPCA and LSCA, ? tiny RPICA lacunar infarct.  . Obesity   . OSA on CPAP    "wears mask part of the time" (09/10/2013)  . Osteoporosis   . Sinus  node dysfunction ([place])    a. 08/2013 s/p SJM DC PPM.     Allergies  Allergen Reactions  . Fentanyl Other (See Comments)    resp suppression per wife, out for 2 days      Current Outpatient Medications  Medication Sig Dispense Refill  . acetaminophen (TYLENOL) 500 MG tablet Take 500 mg by mouth every 6 (six) hours as needed for pain.    Marland Kitchen alendronate (FOSAMAX) 70 MG tablet Take 70 mg by mouth every 7 (seven) days. Take with a full glass of water on an empty stomach. On Mondays    . apixaban (ELIQUIS) 5 MG TABS tablet Take 1 tablet (5 mg total) by mouth 2 (two) times daily. 180 tablet 3  . Calcium Carbonate-Vitamin D (CALCIUM 600 + D PO) Take 600 mg by mouth 2 (two) times daily.    Marland Kitchen donepezil (ARICEPT) 5 MG tablet Take 10 mg by mouth daily.    . dorzolamide-timolol (COSOPT) 22.3-6.8 MG/ML ophthalmic solution USE ONE DROP IN LEFT EYE TWICE DAILY    . enalapril (VASOTEC) 20 MG tablet Take 20 mg by mouth daily.    Marland Kitchen escitalopram (LEXAPRO) 5 MG tablet Take 10 mg by mouth daily.    . fish oil-omega-3 fatty  acids 1000 MG capsule Take 1 g by mouth 2 (two) times daily.    . furosemide (LASIX) 40 MG tablet Take 1 tablet (40 mg total) by mouth daily. 90 tablet 3  . isosorbide mononitrate (IMDUR) 30 MG 24 hr tablet Take 30 mg by mouth daily.    . metFORMIN (GLUCOPHAGE) 500 MG tablet Take 500 mg by mouth daily with breakfast.    . Multiple Vitamin (MULTIVITAMIN WITH MINERALS) TABS tablet Take 1 tablet by mouth daily.    . nitroGLYCERIN (NITROSTAT) 0.4 MG SL tablet Place 1 tablet (0.4 mg total) under the tongue every 5 (five) minutes x 3 doses as needed for chest pain. 25 tablet 3  . potassium chloride SA (KLOR-CON) 20 MEQ tablet TAKE 1 TABLET BY MOUTH EVERY DAY 90 tablet 3  . pravastatin (PRAVACHOL) 20 MG tablet TAKE 1 TABLET (20 MG TOTAL) BY MOUTH EVERY EVENING. 90 tablet 3  . tamsulosin (FLOMAX) 0.4 MG CAPS capsule Take 0.4 mg by mouth daily.     . traMADol (ULTRAM) 50 MG tablet Take 50 mg by  mouth every 6 (six) hours as needed.      No current facility-administered medications for this visit.     Past Surgical History:  Procedure Laterality Date  . CARDIAC CATHETERIZATION  1998  . CATARACT EXTRACTION W/ INTRAOCULAR LENS  IMPLANT, BILATERAL  2010  . LEFT AND RIGHT HEART CATHETERIZATION WITH CORONARY ANGIOGRAM N/A 09/11/2013   Procedure: LEFT AND RIGHT HEART CATHETERIZATION WITH CORONARY ANGIOGRAM;  Surgeon: Wellington Hampshire, MD;  Location: [facility] CATH LAB;  Service: Cardiovascular;  Laterality: N/A;  . PERMANENT PACEMAKER INSERTION N/A 09/13/2013   Procedure: PERMANENT PACEMAKER INSERTION;  Surgeon: Evans Lance, MD;  Location: [facility] CATH LAB;  Service: Cardiovascular;  Laterality: N/A;  . right hip fx  05/26/2016  . right wrist fx  05/26/2016  . STOMACH SURGERY  03/02/2012   ?volvus; "stomach came loose and twisted; went up under rib cage; had that corrected; tacked it up" (09/10/2013)     Allergies  Allergen Reactions  . Fentanyl Other (See Comments)    resp suppression per wife, out for 2 days       Family History  Problem Relation Age of Onset  . Hypertension Father   . Hypertension Mother      Social History [name] reports that he has never smoked. He has never used smokeless tobacco. [name] reports no history of alcohol use.   Review of Systems CONSTITUTIONAL: No weight loss, fever, chills, weakness or fatigue.  HEENT: Eyes: No visual loss, blurred vision, double vision or yellow sclerae.No hearing loss, sneezing, congestion, runny nose or sore throat.  SKIN: No rash or itching.  CARDIOVASCULAR: per hpi RESPIRATORY: No shortness of breath, cough or sputum.  GASTROINTESTINAL: No anorexia, nausea, vomiting or diarrhea. No abdominal pain or blood.  GENITOURINARY: No burning on urination, no polyuria NEUROLOGICAL: No headache, dizziness, syncope, paralysis, ataxia, numbness or tingling in the extremities. No change in bowel or bladder control.   MUSCULOSKELETAL: No muscle, back pain, joint pain or stiffness.  LYMPHATICS: No enlarged nodes. No history of splenectomy.  PSYCHIATRIC: No history of depression or anxiety.  ENDOCRINOLOGIC: No reports of sweating, cold or heat intolerance. No polyuria or polydipsia.  Marland Kitchen   Physical Examination Today's Vitals   04/01/21 1401  BP: 140/90  Pulse: (!) 106  SpO2: 96%  Weight: 157 lb 6.4 oz (71.4 kg)  Height: 6\' 1"  (1.854 m)   Body mass index is  20.77 kg/m.  Gen: resting comfortably, no acute distress HEENT: no scleral icterus, pupils equal round and reactive, no palptable cervical adenopathy,  CV: RRR, no m/r/g, no jvd Resp: Clear to auscultation bilaterally GI: abdomen is soft, non-tender, non-distended, normal bowel sounds, no hepatosplenomegaly MSK: extremities are warm, no edema.  Skin: warm, no rash Neuro:  no focal deficits Psych: appropriate affect   Diagnostic Studies  Cardiac Catheterization 10-01-2013 Procedural Findings:  Hemodynamics RA 3 mmHg  RV 37/1 mmHg  PA 33/12 mmHg  PCWP 6 mmHg  LV 201/4 mHg . LVEDP: 12 mmHg  AO 202/94 mmHg  Oxygen saturations:  PA 74%  AO 94%  Cardiac Output (Fick) 6.72  Cardiac Index (Fick) 3.22  Pulmonary vascular resistance (PVR): 1.9 Woods units.  Coronary angiography: Coronary dominance: right   Left Main: Normal in size with no significant disease.   Left Anterior Descending (LAD): Large in size with mild diffuse atherosclerosis. There is diffuse 20% disease in the midsegment.   1st diagonal (D1): Normal in size with 80% ostial stenosis followed by a 95% proximal stenosis.   2nd diagonal (D2): Medium in size with minor irregularities.   3rd diagonal (D3): Normal in size with no significant disease.   Circumflex (LCx): Normal in size and nondominant. There is 20% mid stenosis.   1st obtuse marginal: Medium in size with minor irregularities.   2nd obtuse marginal: Large is in size with no  significant disease.   3rd obtuse marginal: Normal in size with minor irregularities.   Right Coronary Artery: large in size and dominant. There is 20% stenosis distally.   Posterior descending artery: normal in size with no significant disease.   Posterior AV segment: normal in size with no significant disease.   Posterolateral branchs: PL 1 is large with no significant disease. PL 2 is small. Left ventriculography: Left ventricular systolic function is normal , LVEF is estimated at 60 %, there is no significant mitral regurgitation   07/2012 Echo: mild LVH, LVEF 40-45%, LAE, mild MR,   Assessment and Plan  1.History of cardiomyopathy/Chronic diastolic heart failiure - LVEF has since normalized, still with some diastolic dysfunction --mild LE edema, no SOB or DOE. Due to chronic orthostatic symptoms would avoid being more aggressive with diuretics - continue current meds  2. CAD  - patent major arteries with evidence of disease in diags and OMs fromprior cath - no recent symptoms, continue currentes  3. HTN  -accepting high bp's for him at this time due to prior dizziness Reasonable bp's continue to monitor  4. Bradycardia  - normal recent device check, no symptoms. Continue to monitor.    5. Hyperlipidemia -LDL at goal, continue staitn  6. Afib -no symptoms, continue current meds  F/u 6 months     Arnoldo Lenis, M.D

## 2021-04-01 NOTE — Patient Instructions (Signed)
Medication Instructions:  Your physician recommends that you continue on your current medications as directed. Please refer to the Current Medication list given to you today.  *If you need a refill on your cardiac medications before your next appointment, please call your pharmacy*   Lab Work: None today If you have labs (blood work) drawn today and your tests are completely normal, you will receive your results only by: MyChart Message (if you have MyChart) OR A paper copy in the mail If you have any lab test that is abnormal or we need to change your treatment, we will call you to review the results.   Testing/Procedures: None today   Follow-Up: At [facility], you and your health needs are our priority.  As part of our continuing mission to provide you with exceptional heart care, we have created designated Provider Care Teams.  These Care Teams include your primary Cardiologist (physician) and Advanced Practice Providers (APPs -  Physician Assistants and Nurse Practitioners) who all work together to provide you with the care you need, when you need it.  We recommend signing up for the patient portal called "MyChart".  Sign up information is provided on this After Visit Summary.  MyChart is used to connect with patients for Virtual Visits (Telemedicine).  Patients are able to view lab/test results, encounter notes, upcoming appointments, etc.  Non-urgent messages can be sent to your provider as well.   To learn more about what you can do with MyChart, go to [email or web address].    Your next appointment:   6 month(s)  The format for your next appointment:   In Person  Provider:   Jonathan Branch, MD   Other Instructions None     

## 2021-04-12 ENCOUNTER — Ambulatory Visit (INDEPENDENT_AMBULATORY_CARE_PROVIDER_SITE_OTHER): Payer: Medicare Other

## 2021-04-12 DIAGNOSIS — I428 Other cardiomyopathies: Secondary | ICD-10-CM | POA: Diagnosis not present

## 2021-04-14 LAB — CUP PACEART REMOTE DEVICE CHECK
Battery Remaining Longevity: 95 mo
Battery Remaining Percentage: 80 %
Battery Voltage: 2.93 V
Brady Statistic RV Percent Paced: 64 %
Date Time Interrogation Session: 20220418062948
Implantable Lead Implant Date: 20140919
Implantable Lead Implant Date: 20140919
Implantable Lead Location: 753859
Implantable Lead Location: 753860
Implantable Pulse Generator Implant Date: 20140919
Lead Channel Impedance Value: 410 Ohm
Lead Channel Pacing Threshold Amplitude: 0.75 V
Lead Channel Pacing Threshold Pulse Width: 0.5 ms
Lead Channel Sensing Intrinsic Amplitude: 5.2 mV
Lead Channel Setting Pacing Amplitude: 2.5 V
Lead Channel Setting Pacing Pulse Width: 0.5 ms
Lead Channel Setting Sensing Sensitivity: 2 mV
Pulse Gen Model: 2240
Pulse Gen Serial Number: 7536055

## 2021-04-23 DIAGNOSIS — I10 Essential (primary) hypertension: Secondary | ICD-10-CM | POA: Diagnosis not present

## 2021-04-29 NOTE — Progress Notes (Signed)
Remote pacemaker transmission.   

## 2021-05-05 ENCOUNTER — Other Ambulatory Visit: Payer: Self-pay

## 2021-05-05 ENCOUNTER — Encounter (INDEPENDENT_AMBULATORY_CARE_PROVIDER_SITE_OTHER): Payer: Medicare Other | Admitting: Ophthalmology

## 2021-05-05 DIAGNOSIS — H35033 Hypertensive retinopathy, bilateral: Secondary | ICD-10-CM | POA: Diagnosis not present

## 2021-05-05 DIAGNOSIS — H43813 Vitreous degeneration, bilateral: Secondary | ICD-10-CM

## 2021-05-05 DIAGNOSIS — H353132 Nonexudative age-related macular degeneration, bilateral, intermediate dry stage: Secondary | ICD-10-CM

## 2021-05-05 DIAGNOSIS — I10 Essential (primary) hypertension: Secondary | ICD-10-CM | POA: Diagnosis not present

## 2021-05-05 DIAGNOSIS — H34832 Tributary (branch) retinal vein occlusion, left eye, with macular edema: Secondary | ICD-10-CM | POA: Diagnosis not present

## 2021-05-25 DIAGNOSIS — I10 Essential (primary) hypertension: Secondary | ICD-10-CM | POA: Diagnosis not present

## 2021-06-02 DIAGNOSIS — E1165 Type 2 diabetes mellitus with hyperglycemia: Secondary | ICD-10-CM | POA: Diagnosis not present

## 2021-06-02 DIAGNOSIS — I10 Essential (primary) hypertension: Secondary | ICD-10-CM | POA: Diagnosis not present

## 2021-06-02 DIAGNOSIS — Z299 Encounter for prophylactic measures, unspecified: Secondary | ICD-10-CM | POA: Diagnosis not present

## 2021-06-02 DIAGNOSIS — I25119 Atherosclerotic heart disease of native coronary artery with unspecified angina pectoris: Secondary | ICD-10-CM | POA: Diagnosis not present

## 2021-06-02 DIAGNOSIS — F339 Major depressive disorder, recurrent, unspecified: Secondary | ICD-10-CM | POA: Diagnosis not present

## 2021-06-24 ENCOUNTER — Other Ambulatory Visit: Payer: Self-pay | Admitting: *Deleted

## 2021-06-24 DIAGNOSIS — I10 Essential (primary) hypertension: Secondary | ICD-10-CM | POA: Diagnosis not present

## 2021-06-24 MED ORDER — APIXABAN 5 MG PO TABS
5.0000 mg | ORAL_TABLET | Freq: Two times a day (BID) | ORAL | 1 refills | Status: DC
Start: 2021-06-24 — End: 2021-06-24

## 2021-06-24 MED ORDER — APIXABAN 5 MG PO TABS: 5.0000 mg | ORAL_TABLET | Freq: Two times a day (BID) | ORAL | 1 refills | Status: DC

## 2021-06-24 NOTE — Telephone Encounter (Signed)
Prescription refill request for Eliquis received. Indication: PAF Last office visit: 04/01/21  Zandra Abts MD Scr: 0.82 on 12/04/20 Age:  87 Weight: 71.4kg  Based on above findings Eliquis 5mg  twice daily is the appropriate dose.  Refill approved.

## 2021-06-24 NOTE — Addendum Note (Signed)
Addended by: Malen Gauze on: 06/24/2021 01:25 PM   Modules accepted: Orders

## 2021-07-12 ENCOUNTER — Ambulatory Visit (INDEPENDENT_AMBULATORY_CARE_PROVIDER_SITE_OTHER): Payer: Medicare Other

## 2021-07-12 DIAGNOSIS — I5033 Acute on chronic diastolic (congestive) heart failure: Secondary | ICD-10-CM | POA: Diagnosis not present

## 2021-07-13 LAB — CUP PACEART REMOTE DEVICE CHECK
Battery Remaining Longevity: 29 mo
Battery Remaining Percentage: 24 %
Battery Voltage: 2.93 V
Brady Statistic RV Percent Paced: 63 %
Date Time Interrogation Session: 20220718070624
Implantable Lead Implant Date: 20140919
Implantable Lead Implant Date: 20140919
Implantable Lead Location: 753859
Implantable Lead Location: 753860
Implantable Pulse Generator Implant Date: 20140919
Lead Channel Impedance Value: 400 Ohm
Lead Channel Pacing Threshold Amplitude: 0.75 V
Lead Channel Pacing Threshold Pulse Width: 0.5 ms
Lead Channel Sensing Intrinsic Amplitude: 3.9 mV
Lead Channel Setting Pacing Amplitude: 2.5 V
Lead Channel Setting Pacing Pulse Width: 0.5 ms
Lead Channel Setting Sensing Sensitivity: 2 mV
Pulse Gen Model: 2240
Pulse Gen Serial Number: 7536055

## 2021-07-23 DIAGNOSIS — I10 Essential (primary) hypertension: Secondary | ICD-10-CM | POA: Diagnosis not present

## 2021-08-04 NOTE — Progress Notes (Signed)
Remote pacemaker transmission.   

## 2021-08-19 DIAGNOSIS — H401123 Primary open-angle glaucoma, left eye, severe stage: Secondary | ICD-10-CM | POA: Diagnosis not present

## 2021-08-19 DIAGNOSIS — H401112 Primary open-angle glaucoma, right eye, moderate stage: Secondary | ICD-10-CM | POA: Diagnosis not present

## 2021-08-19 DIAGNOSIS — H348322 Tributary (branch) retinal vein occlusion, left eye, stable: Secondary | ICD-10-CM | POA: Diagnosis not present

## 2021-08-25 DIAGNOSIS — I10 Essential (primary) hypertension: Secondary | ICD-10-CM | POA: Diagnosis not present

## 2021-09-08 DIAGNOSIS — Z299 Encounter for prophylactic measures, unspecified: Secondary | ICD-10-CM | POA: Diagnosis not present

## 2021-09-08 DIAGNOSIS — Z23 Encounter for immunization: Secondary | ICD-10-CM | POA: Diagnosis not present

## 2021-09-08 DIAGNOSIS — E1165 Type 2 diabetes mellitus with hyperglycemia: Secondary | ICD-10-CM | POA: Diagnosis not present

## 2021-09-08 DIAGNOSIS — I10 Essential (primary) hypertension: Secondary | ICD-10-CM | POA: Diagnosis not present

## 2021-09-08 DIAGNOSIS — M159 Polyosteoarthritis, unspecified: Secondary | ICD-10-CM | POA: Diagnosis not present

## 2021-09-24 DIAGNOSIS — I10 Essential (primary) hypertension: Secondary | ICD-10-CM | POA: Diagnosis not present

## 2021-09-29 ENCOUNTER — Encounter (INDEPENDENT_AMBULATORY_CARE_PROVIDER_SITE_OTHER): Payer: Medicare Other | Admitting: Ophthalmology

## 2021-09-29 ENCOUNTER — Other Ambulatory Visit: Payer: Self-pay

## 2021-09-29 DIAGNOSIS — H353132 Nonexudative age-related macular degeneration, bilateral, intermediate dry stage: Secondary | ICD-10-CM

## 2021-09-29 DIAGNOSIS — I10 Essential (primary) hypertension: Secondary | ICD-10-CM | POA: Diagnosis not present

## 2021-09-29 DIAGNOSIS — H34832 Tributary (branch) retinal vein occlusion, left eye, with macular edema: Secondary | ICD-10-CM

## 2021-09-29 DIAGNOSIS — H35033 Hypertensive retinopathy, bilateral: Secondary | ICD-10-CM | POA: Diagnosis not present

## 2021-09-29 DIAGNOSIS — H43813 Vitreous degeneration, bilateral: Secondary | ICD-10-CM | POA: Diagnosis not present

## 2021-10-05 ENCOUNTER — Telehealth: Payer: Self-pay | Admitting: Cardiology

## 2021-10-05 NOTE — Telephone Encounter (Signed)
Patient calling the office for samples of medication:   1.  What medication and dosage are you requesting samples for?  ELIQUIS 5 MG   2.  Are you currently out of this medication? YES   Patient is in the donut hole.  [phone]

## 2021-10-05 NOTE — Telephone Encounter (Signed)
2 Sample boxes of Eliquis 5 mg (Lot # [id number], Exp: 07/2023) placed at front desk for pick up.  Called to notify pt. Phone states that call can not be completed at this time X 3.

## 2021-10-06 ENCOUNTER — Telehealth: Payer: Self-pay

## 2021-10-06 DIAGNOSIS — E876 Hypokalemia: Secondary | ICD-10-CM

## 2021-10-06 MED ORDER — POTASSIUM CHLORIDE CRYS ER 20 MEQ PO TBCR: EXTENDED_RELEASE_TABLET | ORAL | 3 refills | Status: DC

## 2021-10-06 NOTE — Telephone Encounter (Signed)
Medication refill request approved for Potassium CL 20 mEq tablets and sent to [facility].

## 2021-10-06 NOTE — Telephone Encounter (Signed)
Spouse notified samples were upfront for pt. Will come pick up

## 2021-10-11 ENCOUNTER — Ambulatory Visit (INDEPENDENT_AMBULATORY_CARE_PROVIDER_SITE_OTHER): Payer: Medicare Other

## 2021-10-11 DIAGNOSIS — I428 Other cardiomyopathies: Secondary | ICD-10-CM

## 2021-10-13 LAB — CUP PACEART REMOTE DEVICE CHECK
Battery Remaining Longevity: 26 mo
Battery Remaining Percentage: 22 %
Battery Voltage: 2.92 V
Brady Statistic RV Percent Paced: 62 %
Date Time Interrogation Session: 20221017054722
Implantable Lead Implant Date: 20140919
Implantable Lead Implant Date: 20140919
Implantable Lead Location: 753859
Implantable Lead Location: 753860
Implantable Pulse Generator Implant Date: 20140919
Lead Channel Impedance Value: 400 Ohm
Lead Channel Pacing Threshold Amplitude: 0.75 V
Lead Channel Pacing Threshold Pulse Width: 0.5 ms
Lead Channel Sensing Intrinsic Amplitude: 4.2 mV
Lead Channel Setting Pacing Amplitude: 2.5 V
Lead Channel Setting Pacing Pulse Width: 0.5 ms
Lead Channel Setting Sensing Sensitivity: 2 mV
Pulse Gen Model: 2240
Pulse Gen Serial Number: 7536055

## 2021-10-14 ENCOUNTER — Other Ambulatory Visit: Payer: Self-pay | Admitting: Cardiology

## 2021-10-20 ENCOUNTER — Encounter: Payer: Self-pay | Admitting: Cardiology

## 2021-10-20 ENCOUNTER — Other Ambulatory Visit: Payer: Self-pay

## 2021-10-20 ENCOUNTER — Ambulatory Visit (INDEPENDENT_AMBULATORY_CARE_PROVIDER_SITE_OTHER): Payer: Medicare Other | Admitting: Cardiology

## 2021-10-20 VITALS — BP 152/80 | HR 95 | Ht 72.0 in | Wt 155.4 lb

## 2021-10-20 DIAGNOSIS — I4891 Unspecified atrial fibrillation: Secondary | ICD-10-CM | POA: Diagnosis not present

## 2021-10-20 DIAGNOSIS — I10 Essential (primary) hypertension: Secondary | ICD-10-CM | POA: Diagnosis not present

## 2021-10-20 DIAGNOSIS — Z950 Presence of cardiac pacemaker: Secondary | ICD-10-CM | POA: Diagnosis not present

## 2021-10-20 DIAGNOSIS — I5032 Chronic diastolic (congestive) heart failure: Secondary | ICD-10-CM

## 2021-10-20 DIAGNOSIS — E782 Mixed hyperlipidemia: Secondary | ICD-10-CM

## 2021-10-20 DIAGNOSIS — I2510 Atherosclerotic heart disease of native coronary artery without angina pectoris: Secondary | ICD-10-CM

## 2021-10-20 MED ORDER — FUROSEMIDE 40 MG PO TABS: ORAL_TABLET | ORAL | 3 refills | Status: DC

## 2021-10-20 NOTE — Progress Notes (Signed)
Remote pacemaker transmission.   

## 2021-10-20 NOTE — Progress Notes (Signed)
Clinical Summary [name] is a 87 y.o.maleseen today for follow up of the following medical problems.    1. Mild systolic dysfunction   - echo 07/2012 w/ LVEF 40-45%   - Cath 08/2013 w/ minimal CAD, LV gram 60% with normal filling pressures on RHC.     2018 echo LVEF 55-60%.    - changed HCTZ to lasix 40mg  daily due to LE edema - at 10/2020 PA visit swelling had improved   - breathing is up and down, for the most part does well - chronic LE edema, overall stable. We have avoided aggressive diuretic dosing due to chronic orthostatic dizziness.      2. HTN    - accepting higher bp's due to prior dizziness  - previously stopped norvasc to see if would help with LE edema  -compliant with meds     3. Bradycardia   -s/p St Jude dual chamber pacemaker   - followed by EP   10/11/21 normal device check   4. Hyperlipidemia - labs followed by pcp   11/2020 TC 110 TG 46 HDL 54 LDL 45 - he is compliant with meds - reports upcoming labs with pcp   5. Afib - no palpitations - no bleeding on eliquis.     6. CAD   - patent major arteries with evidence of disease in diags and OMs from prior cath - infrequent chest pains.    SH: wife on chemo and radiation along with recent hysterectomy, endometrial cancer. Just completed treatments, told she is cancer free   Past Medical History:  Diagnosis Date   Allergic rhinitis    Anemia    Anxiety    Arthritis    "all over my body" (09/10/2013)   BPH (benign prostatic hypertrophy)    CAD (coronary artery disease)    a. 08/2013 LM nl, LAD 91m, D1 80ost, 95p, LCX 14m, RCA 20d, EF 60%.   Chronic lower back pain    Depression    Diabetes mellitus    "diet controlled" (09/10/2013)   Diaphragmatic hernia without mention of obstruction or gangrene    Diastolic congestive heart failure ([facility])    Elevated liver function tests    Essential hypertension, benign    Lacunar infarction ([place])    a. 08/2013 post-cath, MRI: sm acute lacunar  infarcts in LPCA and LSCA, ? tiny RPICA lacunar infarct.   Obesity    OSA on CPAP    "wears mask part of the time" (09/10/2013)   Osteoporosis    Sinus node dysfunction ([place])    a. 08/2013 s/p SJM DC PPM.     Allergies  Allergen Reactions   Fentanyl Other (See Comments)    resp suppression per wife, out for 2 days      Current Outpatient Medications  Medication Sig Dispense Refill   acetaminophen (TYLENOL) 500 MG tablet Take 500 mg by mouth every 6 (six) hours as needed for pain.     alendronate (FOSAMAX) 70 MG tablet Take 70 mg by mouth every 7 (seven) days. Take with a full glass of water on an empty stomach. On Mondays     apixaban (ELIQUIS) 5 MG TABS tablet Take 1 tablet (5 mg total) by mouth 2 (two) times daily. 180 tablet 1   Calcium Carbonate-Vitamin D (CALCIUM 600 + D PO) Take 600 mg by mouth 2 (two) times daily.     donepezil (ARICEPT) 5 MG tablet Take 10 mg by mouth daily.  dorzolamide-timolol (COSOPT) 22.3-6.8 MG/ML ophthalmic solution USE ONE DROP IN LEFT EYE TWICE DAILY     enalapril (VASOTEC) 20 MG tablet Take 20 mg by mouth 2 (two) times daily.     escitalopram (LEXAPRO) 5 MG tablet Take 10 mg by mouth daily.     fish oil-omega-3 fatty acids 1000 MG capsule Take 1 g by mouth 2 (two) times daily.     furosemide (LASIX) 40 MG tablet TAKE (1) TABLET BY MOUTH ONCE DAILY. 30 tablet 0   isosorbide mononitrate (IMDUR) 30 MG 24 hr tablet Take 30 mg by mouth daily.     metFORMIN (GLUCOPHAGE) 500 MG tablet Take 500 mg by mouth daily with breakfast.     Multiple Vitamin (MULTIVITAMIN WITH MINERALS) TABS tablet Take 1 tablet by mouth daily.     nitroGLYCERIN (NITROSTAT) 0.4 MG SL tablet Place 1 tablet (0.4 mg total) under the tongue every 5 (five) minutes x 3 doses as needed for chest pain. 25 tablet 3   potassium chloride SA (KLOR-CON) 20 MEQ tablet TAKE 1 TABLET BY MOUTH EVERY DAY 90 tablet 3   pravastatin (PRAVACHOL) 20 MG tablet TAKE 1 TABLET (20 MG TOTAL) BY MOUTH EVERY  EVENING. 90 tablet 3   tamsulosin (FLOMAX) 0.4 MG CAPS capsule Take 0.4 mg by mouth daily.      traMADol (ULTRAM) 50 MG tablet Take 50 mg by mouth every 6 (six) hours as needed.      No current facility-administered medications for this visit.     Past Surgical History:  Procedure Laterality Date   CARDIAC CATHETERIZATION  1998   CATARACT EXTRACTION W/ INTRAOCULAR LENS  IMPLANT, BILATERAL  2010   LEFT AND RIGHT HEART CATHETERIZATION WITH CORONARY ANGIOGRAM N/A 09/11/2013   Procedure: LEFT AND RIGHT HEART CATHETERIZATION WITH CORONARY ANGIOGRAM;  Surgeon: Wellington Hampshire, MD;  Location: [facility] CATH LAB;  Service: Cardiovascular;  Laterality: N/A;   PERMANENT PACEMAKER INSERTION N/A 09/13/2013   Procedure: PERMANENT PACEMAKER INSERTION;  Surgeon: Evans Lance, MD;  Location: [facility] CATH LAB;  Service: Cardiovascular;  Laterality: N/A;   right hip fx  05/26/2016   right wrist fx  05/26/2016   STOMACH SURGERY  03/02/2012   ?volvus; "stomach came loose and twisted; went up under rib cage; had that corrected; tacked it up" (09/10/2013)     Allergies  Allergen Reactions   Fentanyl Other (See Comments)    resp suppression per wife, out for 2 days       Family History  Problem Relation Age of Onset   Hypertension Father    Hypertension Mother      Social History [name] reports that he has never smoked. He has never used smokeless tobacco. [name] reports no history of alcohol use.   Review of Systems CONSTITUTIONAL: No weight loss, fever, chills, weakness or fatigue.  HEENT: Eyes: No visual loss, blurred vision, double vision or yellow sclerae.No hearing loss, sneezing, congestion, runny nose or sore throat.  SKIN: No rash or itching.  CARDIOVASCULAR: per hpi RESPIRATORY: No shortness of breath, cough or sputum.  GASTROINTESTINAL: No anorexia, nausea, vomiting or diarrhea. No abdominal pain or blood.  GENITOURINARY: No burning on urination, no polyuria NEUROLOGICAL: No headache,  dizziness, syncope, paralysis, ataxia, numbness or tingling in the extremities. No change in bowel or bladder control.  MUSCULOSKELETAL: No muscle, back pain, joint pain or stiffness.  LYMPHATICS: No enlarged nodes. No history of splenectomy.  PSYCHIATRIC: No history of depression or anxiety.  ENDOCRINOLOGIC: No reports  of sweating, cold or heat intolerance. No polyuria or polydipsia.  Marland Kitchen   Physical Examination Today's Vitals   10/20/21 1319  BP: (!) 152/80  Pulse: 95  SpO2: 99%  Weight: 155 lb 6.4 oz (70.5 kg)  Height: 6' (1.829 m)   Body mass index is 21.08 kg/m.  Gen: resting comfortably, no acute distress HEENT: no scleral icterus, pupils equal round and reactive, no palptable cervical adenopathy,  CV: irreg, no m/r/ gno jvd Resp: Clear to auscultation bilaterally GI: abdomen is soft, non-tender, non-distended, normal bowel sounds, no hepatosplenomegaly MSK: extremities are warm, 2+ bilateral LE edema Skin: warm, no rash Neuro:  no focal deficits Psych: appropriate affect   Diagnostic Studies Cardiac Catheterization 9.17.2014   Procedural Findings:   Hemodynamics   RA 3 mmHg   RV 37/1 mmHg   PA 33/12 mmHg   PCWP 6 mmHg   LV 201/4 mHg . LVEDP: 12 mmHg   AO 202/94 mmHg   Oxygen saturations:   PA 74%   AO 94%   Cardiac Output (Fick) 6.72   Cardiac Index (Fick) 3.22   Pulmonary vascular resistance (PVR): 1.9 Woods units.   Coronary angiography:   Coronary dominance: right   Left Main: Normal in size with no significant disease.   Left Anterior Descending (LAD): Large in size with mild diffuse atherosclerosis. There is diffuse 20% disease in the midsegment.   1st diagonal (D1): Normal in size with 80% ostial stenosis followed by a 95% proximal stenosis.   2nd diagonal (D2): Medium in size with minor irregularities.   3rd diagonal (D3): Normal in size with no significant disease.   Circumflex (LCx): Normal in size and nondominant. There is 20% mid stenosis.   1st  obtuse marginal: Medium in size with minor irregularities.   2nd obtuse marginal: Large is in size with no significant disease.   3rd obtuse marginal: Normal in size with minor irregularities.   Right Coronary Artery: large in size and dominant. There is 20% stenosis distally.   Posterior descending artery: normal in size with no significant disease.   Posterior AV segment: normal in size with no significant disease.   Posterolateral branchs: PL 1 is large with no significant disease. PL 2 is small. Left ventriculography: Left ventricular systolic function is normal , LVEF is estimated at 60 %, there is no significant mitral regurgitation     07/2012 Echo: mild LVH, LVEF 40-45%, LAE, mild MR,     Assessment and Plan  1. History of cardiomyopathy/Chronic diastolic heart failiure - LVEF has since normalized, still with some diastolic dysfunction -. Due to chronic orthostatic symptoms would avoid being more aggressive with diuretics, we have tolerated his chrnoic LE edema particulary since he has not had any significant SOB - continue current meds   2. CAD   - patent major arteries with evidence of disease in diags and OMs from prior cath -infrequent nonspecific chest pains, perhaps related to small vessel disease. If significant progression could consider ischemic testing at that time.    3. HTN   - accepting high bp's for him at this time due to prior dizziness Bp's are reasonable to day, continue current meds   4. Bradycardia    -recent device check was normal, continue to monitor     5. Hyperlipidemia -has been at goal, continue pravastatin   6. Afib -denies symptosm, continue meds including eliquis.      Arnoldo Lenis, M.D.

## 2021-10-20 NOTE — Patient Instructions (Signed)
Medication Instructions:  Your physician recommends that you continue on your current medications as directed. Please refer to the Current Medication list given to you today.  *If you need a refill on your cardiac medications before your next appointment, please call your pharmacy*   Lab Work: NOne If you have labs (blood work) drawn today and your tests are completely normal, you will receive your results only by: [place] (if you have MyChart) OR A paper copy in the mail If you have any lab test that is abnormal or we need to change your treatment, we will call you to review the results.   Testing/Procedures: None   Follow-Up: At [facility], you and your health needs are our priority.  As part of our continuing mission to provide you with exceptional heart care, we have created designated Provider Care Teams.  These Care Teams include your primary Cardiologist (physician) and Advanced Practice Providers (APPs -  Physician Assistants and Nurse Practitioners) who all work together to provide you with the care you need, when you need it.  We recommend signing up for the patient portal called "MyChart".  Sign up information is provided on this After Visit Summary.  MyChart is used to connect with patients for Virtual Visits (Telemedicine).  Patients are able to view lab/test results, encounter notes, upcoming appointments, etc.  Non-urgent messages can be sent to your provider as well.   To learn more about what you can do with MyChart, go to [email or web address].    Your next appointment:   6 month(s)  The format for your next appointment:   In Person  Provider:   Carlyle Dolly, MD   Other Instructions

## 2021-10-25 DIAGNOSIS — I10 Essential (primary) hypertension: Secondary | ICD-10-CM | POA: Diagnosis not present

## 2021-10-26 ENCOUNTER — Telehealth: Payer: Self-pay | Admitting: *Deleted

## 2021-10-26 NOTE — Telephone Encounter (Signed)
Received fax from [place] stating that pt was approved for patient assistance through 12/25/21. Wife notified and voiced understanding.

## 2021-10-26 NOTE — Telephone Encounter (Signed)
Spoke with pt's wife. Informed that [employer] states that pt is not eligible to receive pt assistance until he has met his OOP prescription cost of [phone] and call to let them know if he files taxes.

## 2021-10-28 DIAGNOSIS — Z23 Encounter for immunization: Secondary | ICD-10-CM | POA: Diagnosis not present

## 2021-11-24 DIAGNOSIS — I10 Essential (primary) hypertension: Secondary | ICD-10-CM | POA: Diagnosis not present

## 2021-12-07 DIAGNOSIS — Z6822 Body mass index (BMI) 22.0-22.9, adult: Secondary | ICD-10-CM | POA: Diagnosis not present

## 2021-12-07 DIAGNOSIS — R5383 Other fatigue: Secondary | ICD-10-CM | POA: Diagnosis not present

## 2021-12-07 DIAGNOSIS — Z7189 Other specified counseling: Secondary | ICD-10-CM | POA: Diagnosis not present

## 2021-12-07 DIAGNOSIS — Z0000 Encounter for general adult medical examination without abnormal findings: Secondary | ICD-10-CM | POA: Diagnosis not present

## 2021-12-07 DIAGNOSIS — I10 Essential (primary) hypertension: Secondary | ICD-10-CM | POA: Diagnosis not present

## 2021-12-07 DIAGNOSIS — E7800 Pure hypercholesterolemia, unspecified: Secondary | ICD-10-CM | POA: Diagnosis not present

## 2021-12-07 DIAGNOSIS — E039 Hypothyroidism, unspecified: Secondary | ICD-10-CM | POA: Diagnosis not present

## 2021-12-07 DIAGNOSIS — Z789 Other specified health status: Secondary | ICD-10-CM | POA: Diagnosis not present

## 2021-12-07 DIAGNOSIS — Z125 Encounter for screening for malignant neoplasm of prostate: Secondary | ICD-10-CM | POA: Diagnosis not present

## 2021-12-07 DIAGNOSIS — Z1339 Encounter for screening examination for other mental health and behavioral disorders: Secondary | ICD-10-CM | POA: Diagnosis not present

## 2021-12-07 DIAGNOSIS — Z1331 Encounter for screening for depression: Secondary | ICD-10-CM | POA: Diagnosis not present

## 2021-12-07 DIAGNOSIS — Z79899 Other long term (current) drug therapy: Secondary | ICD-10-CM | POA: Diagnosis not present

## 2021-12-07 DIAGNOSIS — Z299 Encounter for prophylactic measures, unspecified: Secondary | ICD-10-CM | POA: Diagnosis not present

## 2021-12-24 DIAGNOSIS — I10 Essential (primary) hypertension: Secondary | ICD-10-CM | POA: Diagnosis not present

## 2022-01-04 DIAGNOSIS — I429 Cardiomyopathy, unspecified: Secondary | ICD-10-CM | POA: Diagnosis not present

## 2022-01-04 DIAGNOSIS — F339 Major depressive disorder, recurrent, unspecified: Secondary | ICD-10-CM | POA: Diagnosis not present

## 2022-01-04 DIAGNOSIS — Z299 Encounter for prophylactic measures, unspecified: Secondary | ICD-10-CM | POA: Diagnosis not present

## 2022-01-04 DIAGNOSIS — I25119 Atherosclerotic heart disease of native coronary artery with unspecified angina pectoris: Secondary | ICD-10-CM | POA: Diagnosis not present

## 2022-01-04 DIAGNOSIS — E1165 Type 2 diabetes mellitus with hyperglycemia: Secondary | ICD-10-CM | POA: Diagnosis not present

## 2022-01-04 DIAGNOSIS — I10 Essential (primary) hypertension: Secondary | ICD-10-CM | POA: Diagnosis not present

## 2022-01-05 ENCOUNTER — Telehealth: Payer: Self-pay | Admitting: Cardiology

## 2022-01-05 ENCOUNTER — Telehealth: Payer: Self-pay | Admitting: *Deleted

## 2022-01-05 NOTE — Telephone Encounter (Signed)
Called to notify pt that he has been denied [employer] patient assistance foundation(Eliquis). Letter faxed stated that he has not met his 3% out of pocket of $745.33.

## 2022-01-05 NOTE — Telephone Encounter (Signed)
Returned call to pt. Spoke with wife. Informed of denial and need to meet out of pocket amount of $745.33.

## 2022-01-05 NOTE — Telephone Encounter (Signed)
Pt returning call to [facility]... please advise

## 2022-01-05 NOTE — Telephone Encounter (Signed)
Returned call and spoke with wife. Information given to wife and documented in chart.

## 2022-01-10 ENCOUNTER — Ambulatory Visit (INDEPENDENT_AMBULATORY_CARE_PROVIDER_SITE_OTHER): Payer: Medicare Other

## 2022-01-10 DIAGNOSIS — I495 Sick sinus syndrome: Secondary | ICD-10-CM

## 2022-01-11 LAB — CUP PACEART REMOTE DEVICE CHECK
Battery Remaining Longevity: 24 mo
Battery Remaining Percentage: 20 %
Battery Voltage: 2.9 V
Brady Statistic RV Percent Paced: 60 %
Date Time Interrogation Session: 20230116020029
Implantable Lead Implant Date: 20140919
Implantable Lead Implant Date: 20140919
Implantable Lead Location: 753859
Implantable Lead Location: 753860
Implantable Pulse Generator Implant Date: 20140919
Lead Channel Impedance Value: 400 Ohm
Lead Channel Pacing Threshold Amplitude: 0.75 V
Lead Channel Pacing Threshold Pulse Width: 0.5 ms
Lead Channel Sensing Intrinsic Amplitude: 4.1 mV
Lead Channel Setting Pacing Amplitude: 2.5 V
Lead Channel Setting Pacing Pulse Width: 0.5 ms
Lead Channel Setting Sensing Sensitivity: 2 mV
Pulse Gen Model: 2240
Pulse Gen Serial Number: 7536055

## 2022-01-21 NOTE — Progress Notes (Signed)
Remote pacemaker transmission.   

## 2022-01-23 DIAGNOSIS — I10 Essential (primary) hypertension: Secondary | ICD-10-CM | POA: Diagnosis not present

## 2022-02-09 ENCOUNTER — Encounter: Payer: Self-pay | Admitting: Internal Medicine

## 2022-02-09 ENCOUNTER — Ambulatory Visit (INDEPENDENT_AMBULATORY_CARE_PROVIDER_SITE_OTHER): Payer: Medicare Other | Admitting: Internal Medicine

## 2022-02-09 ENCOUNTER — Other Ambulatory Visit: Payer: Self-pay

## 2022-02-09 VITALS — BP 150/90 | HR 74 | Ht 72.0 in | Wt 153.1 lb

## 2022-02-09 DIAGNOSIS — I495 Sick sinus syndrome: Secondary | ICD-10-CM | POA: Diagnosis not present

## 2022-02-09 DIAGNOSIS — I480 Paroxysmal atrial fibrillation: Secondary | ICD-10-CM | POA: Diagnosis not present

## 2022-02-09 LAB — PACEMAKER DEVICE OBSERVATION

## 2022-02-09 MED ORDER — METOPROLOL SUCCINATE ER 25 MG PO TB24: 25.0000 mg | ORAL_TABLET | Freq: Every day | ORAL | 3 refills | Status: DC

## 2022-02-09 NOTE — Progress Notes (Signed)
HPI [name] returns today for follow-up of his atrial fibrillation and symptomatic bradycardia. He is an 88 year old man with a history of paroxysmal atrial fibrillation,symptomatic bradycardia, status post pacemaker insertion, hypertension, and chronic peripheral edema. He has been sedentary in the past secondary to chronic back pain.his blood pressure has been fairly well-controlled though a little high. He has been in atrial fib which is now chronic. He has been on Eliquis. His rates are well controlled. He has not fallen. He has chest pressure.  Allergies  Allergen Reactions   Fentanyl Other (See Comments)    resp suppression per wife, out for 2 days      Current Outpatient Medications  Medication Sig Dispense Refill   acetaminophen (TYLENOL) 500 MG tablet Take 500 mg by mouth every 6 (six) hours as needed for pain.     alendronate (FOSAMAX) 70 MG tablet Take 70 mg by mouth every 7 (seven) days. Take with a full glass of water on an empty stomach. On Mondays     apixaban (ELIQUIS) 5 MG TABS tablet Take 1 tablet (5 mg total) by mouth 2 (two) times daily. 180 tablet 1   Calcium Carbonate-Vitamin D (CALCIUM 600 + D PO) Take 600 mg by mouth 2 (two) times daily.     donepezil (ARICEPT) 5 MG tablet Take 10 mg by mouth daily.     dorzolamide-timolol (COSOPT) 22.3-6.8 MG/ML ophthalmic solution USE ONE DROP IN LEFT EYE TWICE DAILY     enalapril (VASOTEC) 20 MG tablet Take 20 mg by mouth 2 (two) times daily.     escitalopram (LEXAPRO) 5 MG tablet Take 10 mg by mouth daily.     fish oil-omega-3 fatty acids 1000 MG capsule Take 1 g by mouth 2 (two) times daily.     furosemide (LASIX) 40 MG tablet TAKE (1) TABLET BY MOUTH ONCE DAILY. 90 tablet 3   isosorbide mononitrate (IMDUR) 30 MG 24 hr tablet Take 30 mg by mouth daily.     memantine (NAMENDA) 5 MG tablet Take 5 mg by mouth 2 (two) times daily.     metFORMIN (GLUCOPHAGE) 500 MG tablet Take 500 mg by mouth daily with breakfast.      Multiple Vitamin (MULTIVITAMIN WITH MINERALS) TABS tablet Take 1 tablet by mouth daily.     nitroGLYCERIN (NITROSTAT) 0.4 MG SL tablet Place 1 tablet (0.4 mg total) under the tongue every 5 (five) minutes x 3 doses as needed for chest pain. 25 tablet 3   potassium chloride SA (KLOR-CON) 20 MEQ tablet TAKE 1 TABLET BY MOUTH EVERY DAY 90 tablet 3   pravastatin (PRAVACHOL) 20 MG tablet TAKE 1 TABLET (20 MG TOTAL) BY MOUTH EVERY EVENING. 90 tablet 3   tamsulosin (FLOMAX) 0.4 MG CAPS capsule Take 0.4 mg by mouth daily.      traMADol (ULTRAM) 50 MG tablet Take 50 mg by mouth every 6 (six) hours as needed.      No current facility-administered medications for this visit.     Past Medical History:  Diagnosis Date   Allergic rhinitis    Anemia    Anxiety    Arthritis    "all over my body" (09/10/2013)   BPH (benign prostatic hypertrophy)    CAD (coronary artery disease)    a. 08/2013 LM nl, LAD 86m, D1 80ost, 95p, LCX 6m, RCA 20d, EF 60%.   Chronic lower back pain    Depression    Diabetes mellitus    "diet controlled" (09/10/2013)  Diaphragmatic hernia without mention of obstruction or gangrene    Diastolic congestive heart failure (HCC)    Elevated liver function tests    Essential hypertension, benign    Lacunar infarction ([place])    a. 08/2013 post-cath, MRI: sm acute lacunar infarcts in LPCA and LSCA, ? tiny RPICA lacunar infarct.   Obesity    OSA on CPAP    "wears mask part of the time" (09/10/2013)   Osteoporosis    Sinus node dysfunction ([place])    a. 08/2013 s/p SJM DC PPM.    ROS:   All systems reviewed and negative except as noted in the HPI.   Past Surgical History:  Procedure Laterality Date   CARDIAC CATHETERIZATION  1998   CATARACT EXTRACTION W/ INTRAOCULAR LENS  IMPLANT, BILATERAL  2010   LEFT AND RIGHT HEART CATHETERIZATION WITH CORONARY ANGIOGRAM N/A 09/11/2013   Procedure: LEFT AND RIGHT HEART CATHETERIZATION WITH CORONARY ANGIOGRAM;  Surgeon: Wellington Hampshire, MD;   Location: [facility] CATH LAB;  Service: Cardiovascular;  Laterality: N/A;   PERMANENT PACEMAKER INSERTION N/A 09/13/2013   Procedure: PERMANENT PACEMAKER INSERTION;  Surgeon: Evans Lance, MD;  Location: [facility] CATH LAB;  Service: Cardiovascular;  Laterality: N/A;   right hip fx  05/26/2016   right wrist fx  05/26/2016   STOMACH SURGERY  03/02/2012   ?volvus; "stomach came loose and twisted; went up under rib cage; had that corrected; tacked it up" (09/10/2013)     Family History  Problem Relation Age of Onset   Hypertension Father    Hypertension Mother      Social History   Socioeconomic History   Marital status: Married    Spouse name: Not on file   Number of children: 2   Years of education: Not on file   Highest education level: Not on file  Occupational History   Occupation: Tax inspector FOR [employer]    Comment: WORKED IN [employer]  Tobacco Use   Smoking status: Never   Smokeless tobacco: Never  Vaping Use   Vaping Use: Never used  Substance and Sexual Activity   Alcohol use: No    Alcohol/week: 0.0 standard drinks   Drug use: No   Sexual activity: Never  Other Topics Concern   Not on file  Social History Narrative   Not on file   Social Determinants of Health   Financial Resource Strain: Not on file  Food Insecurity: Not on file  Transportation Needs: Not on file  Physical Activity: Not on file  Stress: Not on file  Social Connections: Not on file  Intimate Partner Violence: Not on file     Ht 6' (1.829 m)    Wt 153 lb 1.6 oz (69.4 kg)    BMI 20.76 kg/m   Physical Exam:  Well appearing NAD HEENT: Unremarkable Neck:  No JVD, no thyromegally Lymphatics:  No adenopathy Back:  No CVA tenderness Lungs:  Clear with minimal basilar rales HEART:  Regular rate rhythm, no murmurs, no rubs, no clicks Abd:  soft, positive bowel sounds, no organomegally, no rebound, no guarding Ext:  2 plus pulses, 2+ edema, no cyanosis, no clubbing Skin:  No rashes no  nodules Neuro:  CN II through XII intact, motor grossly intact  EKG - atrial fib with a controlled VR  DEVICE  Normal device function.  See PaceArt for details.   Assess/Plan: \ 1. Persistent atrial fib - his rate is controlled. He will continue his Eliquis.  2. Falls - He is  using a walker and has not had any.  3. HTN - his bp is not well controlled. However, I am not inclined to be too aggressive. I will add low dose toprol. 4. PPM - His St. Jude VVI PM is working normally. He is pacing about 59% of the time.   Carleene Overlie Nazair Fortenberry,MD

## 2022-02-09 NOTE — Patient Instructions (Signed)
Medication Instructions:  Stop Taking Pravastatin  Start Taking Toprol XL 25 mg Daily   *If you need a refill on your cardiac medications before your next appointment, please call your pharmacy*   Lab Work: NONE   If you have labs (blood work) drawn today and your tests are completely normal, you will receive your results only by: [place] (if you have MyChart) OR A paper copy in the mail If you have any lab test that is abnormal or we need to change your treatment, we will call you to review the results.   Testing/Procedures: NONE    Follow-Up: At [facility], you and your health needs are our priority.  As part of our continuing mission to provide you with exceptional heart care, we have created designated Provider Care Teams.  These Care Teams include your primary Cardiologist (physician) and Advanced Practice Providers (APPs -  Physician Assistants and Nurse Practitioners) who all work together to provide you with the care you need, when you need it.  We recommend signing up for the patient portal called "MyChart".  Sign up information is provided on this After Visit Summary.  MyChart is used to connect with patients for Virtual Visits (Telemedicine).  Patients are able to view lab/test results, encounter notes, upcoming appointments, etc.  Non-urgent messages can be sent to your provider as well.   To learn more about what you can do with MyChart, go to [email or web address].    Your next appointment:   1 year(s)  The format for your next appointment:   In Person  Provider:   Cristopher Peru, MD    Other Instructions Thank you for choosing [facility]!

## 2022-02-16 DIAGNOSIS — H401112 Primary open-angle glaucoma, right eye, moderate stage: Secondary | ICD-10-CM | POA: Diagnosis not present

## 2022-02-16 DIAGNOSIS — H0102A Squamous blepharitis right eye, upper and lower eyelids: Secondary | ICD-10-CM | POA: Diagnosis not present

## 2022-02-16 DIAGNOSIS — H401123 Primary open-angle glaucoma, left eye, severe stage: Secondary | ICD-10-CM | POA: Diagnosis not present

## 2022-02-16 DIAGNOSIS — H348322 Tributary (branch) retinal vein occlusion, left eye, stable: Secondary | ICD-10-CM | POA: Diagnosis not present

## 2022-02-16 DIAGNOSIS — E119 Type 2 diabetes mellitus without complications: Secondary | ICD-10-CM | POA: Diagnosis not present

## 2022-02-16 DIAGNOSIS — Z961 Presence of intraocular lens: Secondary | ICD-10-CM | POA: Diagnosis not present

## 2022-02-16 DIAGNOSIS — H0102B Squamous blepharitis left eye, upper and lower eyelids: Secondary | ICD-10-CM | POA: Diagnosis not present

## 2022-02-22 DIAGNOSIS — I10 Essential (primary) hypertension: Secondary | ICD-10-CM | POA: Diagnosis not present

## 2022-02-23 ENCOUNTER — Encounter (INDEPENDENT_AMBULATORY_CARE_PROVIDER_SITE_OTHER): Payer: Medicare Other | Admitting: Ophthalmology

## 2022-02-23 ENCOUNTER — Other Ambulatory Visit: Payer: Self-pay

## 2022-02-23 DIAGNOSIS — H34832 Tributary (branch) retinal vein occlusion, left eye, with macular edema: Secondary | ICD-10-CM

## 2022-02-23 DIAGNOSIS — H35033 Hypertensive retinopathy, bilateral: Secondary | ICD-10-CM

## 2022-02-23 DIAGNOSIS — H353132 Nonexudative age-related macular degeneration, bilateral, intermediate dry stage: Secondary | ICD-10-CM | POA: Diagnosis not present

## 2022-02-23 DIAGNOSIS — I10 Essential (primary) hypertension: Secondary | ICD-10-CM

## 2022-02-23 DIAGNOSIS — H43813 Vitreous degeneration, bilateral: Secondary | ICD-10-CM

## 2022-02-24 DIAGNOSIS — I10 Essential (primary) hypertension: Secondary | ICD-10-CM | POA: Diagnosis not present

## 2022-02-24 DIAGNOSIS — Z299 Encounter for prophylactic measures, unspecified: Secondary | ICD-10-CM | POA: Diagnosis not present

## 2022-02-24 DIAGNOSIS — D692 Other nonthrombocytopenic purpura: Secondary | ICD-10-CM | POA: Diagnosis not present

## 2022-03-24 DIAGNOSIS — I10 Essential (primary) hypertension: Secondary | ICD-10-CM | POA: Diagnosis not present

## 2022-04-11 ENCOUNTER — Ambulatory Visit (INDEPENDENT_AMBULATORY_CARE_PROVIDER_SITE_OTHER): Payer: Medicare Other

## 2022-04-11 DIAGNOSIS — I495 Sick sinus syndrome: Secondary | ICD-10-CM

## 2022-04-12 DIAGNOSIS — Z6822 Body mass index (BMI) 22.0-22.9, adult: Secondary | ICD-10-CM | POA: Diagnosis not present

## 2022-04-12 DIAGNOSIS — Z713 Dietary counseling and surveillance: Secondary | ICD-10-CM | POA: Diagnosis not present

## 2022-04-12 DIAGNOSIS — I10 Essential (primary) hypertension: Secondary | ICD-10-CM | POA: Diagnosis not present

## 2022-04-12 DIAGNOSIS — Z299 Encounter for prophylactic measures, unspecified: Secondary | ICD-10-CM | POA: Diagnosis not present

## 2022-04-12 DIAGNOSIS — E1165 Type 2 diabetes mellitus with hyperglycemia: Secondary | ICD-10-CM | POA: Diagnosis not present

## 2022-04-12 LAB — CUP PACEART REMOTE DEVICE CHECK
Battery Remaining Longevity: 22 mo
Battery Remaining Percentage: 18 %
Battery Voltage: 2.9 V
Brady Statistic RV Percent Paced: 72 %
Date Time Interrogation Session: 20230417071033
Implantable Lead Implant Date: 20140919
Implantable Lead Implant Date: 20140919
Implantable Lead Location: 753859
Implantable Lead Location: 753860
Implantable Pulse Generator Implant Date: 20140919
Lead Channel Impedance Value: 400 Ohm
Lead Channel Pacing Threshold Amplitude: 0.75 V
Lead Channel Pacing Threshold Pulse Width: 0.5 ms
Lead Channel Sensing Intrinsic Amplitude: 5 mV
Lead Channel Setting Pacing Amplitude: 2.5 V
Lead Channel Setting Pacing Pulse Width: 0.5 ms
Lead Channel Setting Sensing Sensitivity: 2 mV
Pulse Gen Model: 2240
Pulse Gen Serial Number: 7536055

## 2022-04-24 DIAGNOSIS — I10 Essential (primary) hypertension: Secondary | ICD-10-CM | POA: Diagnosis not present

## 2022-04-28 NOTE — Progress Notes (Signed)
Remote pacemaker transmission.   

## 2022-05-04 ENCOUNTER — Ambulatory Visit (INDEPENDENT_AMBULATORY_CARE_PROVIDER_SITE_OTHER): Payer: Medicare Other | Admitting: Cardiology

## 2022-05-04 ENCOUNTER — Encounter: Payer: Self-pay | Admitting: Cardiology

## 2022-05-04 VITALS — BP 160/120 | HR 93 | Ht 73.0 in | Wt 153.8 lb

## 2022-05-04 DIAGNOSIS — I2510 Atherosclerotic heart disease of native coronary artery without angina pectoris: Secondary | ICD-10-CM

## 2022-05-04 DIAGNOSIS — I480 Paroxysmal atrial fibrillation: Secondary | ICD-10-CM | POA: Diagnosis not present

## 2022-05-04 DIAGNOSIS — I5032 Chronic diastolic (congestive) heart failure: Secondary | ICD-10-CM | POA: Diagnosis not present

## 2022-05-04 DIAGNOSIS — I10 Essential (primary) hypertension: Secondary | ICD-10-CM

## 2022-05-04 DIAGNOSIS — Z950 Presence of cardiac pacemaker: Secondary | ICD-10-CM

## 2022-05-04 MED ORDER — FUROSEMIDE 40 MG PO TABS: 60.0000 mg | ORAL_TABLET | Freq: Every day | ORAL | 3 refills | Status: DC

## 2022-05-04 NOTE — Patient Instructions (Signed)
Medication Instructions:  ?Increase Lasix to 60 mg tablets daily ? ?Labwork: ?In 2 weeks: ?*Bmet ?*Mag ? ?Testing/Procedures: ?None ? ?Follow-Up: ?Follow up with Dr. Harl Bowie in 6 months.  ? ?Any Other Special Instructions Will Be Listed Below (If Applicable). ? ? ? ? ?If you need a refill on your cardiac medications before your next appointment, please call your pharmacy. ? ?

## 2022-05-04 NOTE — Progress Notes (Signed)
? ? ? ?Clinical Summary ?[name] is a 88 y.o.male seen today for follow up of the following medical problems.  ?  ?1. Mild systolic dysfunction   ?- echo 07/2012 w/ LVEF 40-45%   ?- Cath 08/2013 w/ minimal CAD, LV gram 60% with normal filling pressures on RHC.   ?  ?2018 echo LVEF 55-60%.  ?  ?  ?- We have avoided aggressive diuretic dosing due to chronic orthostatic dizziness.  ?- taking lasix '40mg'$  daily ?- no significant SOB/DOE.  ?  ?  ?2. HTN   ? - accepting higher bp's due to prior dizziness ? - previously stopped norvasc to see if would help with LE edema ?  ?-toprol started by EP, increased by pcp to '50mg'$  daily.   ?  ?  ?3. Bradycardia   ?-s/p St Jude dual chamber pacemaker   ?- followed by EP ?  ?03/2022 normal device ?  ?4. Hyperlipidemia ?-labs are followed by pcp ? ?  ?5. Afib ?- infrequent palpitations. No bleeding on eliquis.  ?  ?6. CAD   ?- patent major arteries with evidence of disease in diags and OMs from prior cath ? ?- no recent chest pains.  ?  ?SH: wife on chemo and radiation along with recent hysterectomy, endometrial cancer. Just completed treatments, told she is cancer free ?  ?  ? ? ?Past Medical History:  ?Diagnosis Date  ? Allergic rhinitis   ? Anemia   ? Anxiety   ? Arthritis   ? "all over my body" (09/10/2013)  ? BPH (benign prostatic hypertrophy)   ? CAD (coronary artery disease)   ? a. 08/2013 LM nl, LAD 80m D1 80ost, 95p, LCX 210mRCA 20d, EF 60%.  ? Chronic lower back pain   ? Depression   ? Diabetes mellitus   ? "diet controlled" (09/10/2013)  ? Diaphragmatic hernia without mention of obstruction or gangrene   ? Diastolic congestive heart failure ([place]  ? Elevated liver function tests   ? Essential hypertension, benign   ? [facility]  ? a. 08/2013 post-cath, MRI: sm acute lacunar infarcts in LPCA and LSCA, ? tiny RPICA lacunar infarct.  ? Obesity   ? OSA on CPAP   ? "wears mask part of the time" (09/10/2013)  ? Osteoporosis   ? Sinus node dysfunction (HCC)   ? a. 08/2013  s/p SJM DC PPM.  ? ? ? ?Allergies  ?Allergen Reactions  ? Fentanyl Other (See Comments)  ?  resp suppression per wife, out for 2 days   ? ? ? ?Current Outpatient Medications  ?Medication Sig Dispense Refill  ? acetaminophen (TYLENOL) 500 MG tablet Take 500 mg by mouth every 6 (six) hours as needed for pain.    ? alendronate (FOSAMAX) 70 MG tablet Take 70 mg by mouth every 7 (seven) days. Take with a full glass of water on an empty stomach. On Mondays    ? apixaban (ELIQUIS) 5 MG TABS tablet Take 1 tablet (5 mg total) by mouth 2 (two) times daily. 180 tablet 1  ? Calcium Carbonate-Vitamin D (CALCIUM 600 + D PO) Take 600 mg by mouth 2 (two) times daily.    ? donepezil (ARICEPT) 5 MG tablet Take 10 mg by mouth daily.    ? dorzolamide-timolol (COSOPT) 22.3-6.8 MG/ML ophthalmic solution USE ONE DROP IN LEFT EYE TWICE DAILY    ? enalapril (VASOTEC) 20 MG tablet Take 20 mg by mouth 2 (two) times daily.    ?  escitalopram (LEXAPRO) 5 MG tablet Take 10 mg by mouth daily.    ? fish oil-omega-3 fatty acids 1000 MG capsule Take 1 g by mouth 2 (two) times daily.    ? furosemide (LASIX) 40 MG tablet TAKE (1) TABLET BY MOUTH ONCE DAILY. 90 tablet 3  ? isosorbide mononitrate (IMDUR) 30 MG 24 hr tablet Take 30 mg by mouth daily.    ? memantine (NAMENDA) 5 MG tablet Take 5 mg by mouth 2 (two) times daily.    ? metFORMIN (GLUCOPHAGE) 500 MG tablet Take 500 mg by mouth daily with breakfast.    ? metoprolol succinate (TOPROL XL) 25 MG 24 hr tablet Take 1 tablet (25 mg total) by mouth daily. 90 tablet 3  ? Multiple Vitamin (MULTIVITAMIN WITH MINERALS) TABS tablet Take 1 tablet by mouth daily.    ? nitroGLYCERIN (NITROSTAT) 0.4 MG SL tablet Place 1 tablet (0.4 mg total) under the tongue every 5 (five) minutes x 3 doses as needed for chest pain. 25 tablet 3  ? potassium chloride SA (KLOR-CON) 20 MEQ tablet TAKE 1 TABLET BY MOUTH EVERY DAY 90 tablet 3  ? tamsulosin (FLOMAX) 0.4 MG CAPS capsule Take 0.4 mg by mouth daily.     ? traMADol  (ULTRAM) 50 MG tablet Take 50 mg by mouth every 6 (six) hours as needed.     ? ?No current facility-administered medications for this visit.  ? ? ? ?Past Surgical History:  ?Procedure Laterality Date  ? CARDIAC CATHETERIZATION  1998  ? CATARACT EXTRACTION W/ INTRAOCULAR LENS  IMPLANT, BILATERAL  2010  ? LEFT AND RIGHT HEART CATHETERIZATION WITH CORONARY ANGIOGRAM N/A 09/11/2013  ? Procedure: LEFT AND RIGHT HEART CATHETERIZATION WITH CORONARY ANGIOGRAM;  Surgeon: Wellington Hampshire, MD;  Location: [facility] CATH LAB;  Service: Cardiovascular;  Laterality: N/A;  ? PERMANENT PACEMAKER INSERTION N/A 09/13/2013  ? Procedure: PERMANENT PACEMAKER INSERTION;  Surgeon: Evans Lance, MD;  Location: [facility] CATH LAB;  Service: Cardiovascular;  Laterality: N/A;  ? right hip fx  05/26/2016  ? right wrist fx  05/26/2016  ? STOMACH SURGERY  03/02/2012  ? ?volvus; "stomach came loose and twisted; went up under rib cage; had that corrected; tacked it up" (09/10/2013)  ? ? ? ?Allergies  ?Allergen Reactions  ? Fentanyl Other (See Comments)  ?  resp suppression per wife, out for 2 days   ? ? ? ? ?Family History  ?Problem Relation Age of Onset  ? Hypertension Father   ? Hypertension Mother   ? ? ? ?Social History ?[name] reports that he has never smoked. He has never used smokeless tobacco. ?[name] reports no history of alcohol use. ? ? ?Review of Systems ?CONSTITUTIONAL: No weight loss, fever, chills, weakness or fatigue.  ?HEENT: Eyes: No visual loss, blurred vision, double vision or yellow sclerae.No hearing loss, sneezing, congestion, runny nose or sore throat.  ?SKIN: No rash or itching.  ?CARDIOVASCULAR: per hpi ?RESPIRATORY: No shortness of breath, cough or sputum.  ?GASTROINTESTINAL: No anorexia, nausea, vomiting or diarrhea. No abdominal pain or blood.  ?GENITOURINARY: No burning on urination, no polyuria ?NEUROLOGICAL: No headache, dizziness, syncope, paralysis, ataxia, numbness or tingling in the extremities. No change in bowel or  bladder control.  ?MUSCULOSKELETAL: No muscle, back pain, joint pain or stiffness.  ?LYMPHATICS: No enlarged nodes. No history of splenectomy.  ?PSYCHIATRIC: No history of depression or anxiety.  ?ENDOCRINOLOGIC: No reports of sweating, cold or heat intolerance. No polyuria or polydipsia.  ?. ? ? ?Physical  Examination ?Today's Vitals  ? 05/04/22 1031  ?BP: (!) 160/120  ?Pulse: 93  ?SpO2: 98%  ?Weight: 153 lb 12.8 oz (69.8 kg)  ?Height: '6\' 1"'$  (1.854 m)  ? ?Body mass index is 20.29 kg/m?. ? ?Gen: resting comfortably, no acute distress ?HEENT: no scleral icterus, pupils equal round and reactive, no palptable cervical adenopathy,  ?CV: RRR, no m/rg, no jvd ?Resp: Clear to auscultation bilaterally ?GI: abdomen is soft, non-tender, non-distended, normal bowel sounds, no hepatosplenomegaly ?MSK: extremities are warm, 2+ bilateral LE edema ?Skin: warm, no rash ?Neuro:  no focal deficits ?Psych: appropriate affect ? ? ?Diagnostic Studies ? ?Cardiac Catheterization 9.17.2014   ?Procedural Findings:   ?Hemodynamics   ?RA 3 mmHg   ?RV 37/1 mmHg   ?PA 33/12 mmHg   ?PCWP 6 mmHg   ?LV 201/4 mHg . LVEDP: 12 mmHg   ?AO 202/94 mmHg   ?Oxygen saturations:   ?PA 74%   ?AO 94%   ?Cardiac Output (Fick) 6.72   ?Cardiac Index (Fick) 3.22   ?Pulmonary vascular resistance (PVR): 1.9 [place] units.   ?Coronary angiography:   ?Coronary dominance: right   ?Left Main: Normal in size with no significant disease.   ?Left Anterior Descending (LAD): Large in size with mild diffuse atherosclerosis. There is diffuse 20% disease in the midsegment.   ?1st diagonal (D1): Normal in size with 80% ostial stenosis followed by a 95% proximal stenosis.   ?2nd diagonal (D2): Medium in size with minor irregularities.   ?3rd diagonal (D3): Normal in size with no significant disease.   ?Circumflex (LCx): Normal in size and nondominant. There is 20% mid stenosis.   ?1st obtuse marginal: Medium in size with minor irregularities.   ?2nd obtuse marginal: Large is in  size with no significant disease.   ?3rd obtuse marginal: Normal in size with minor irregularities.   ?Right Coronary Artery: large in size and dominant. There is 20% stenosis distally.   ?Posterior descending ar

## 2022-05-18 ENCOUNTER — Other Ambulatory Visit (HOSPITAL_COMMUNITY)
Admission: RE | Admit: 2022-05-18 | Discharge: 2022-05-18 | Disposition: A | Discharge status: Home / Self Care | Payer: Medicare Other | Source: Ambulatory Visit | Attending: Cardiology | Admitting: Cardiology

## 2022-05-18 DIAGNOSIS — I480 Paroxysmal atrial fibrillation: Secondary | ICD-10-CM | POA: Insufficient documentation

## 2022-05-18 LAB — BASIC METABOLIC PANEL
Anion gap: 5 (ref 5–15)
BUN: 28 mg/dL — ABNORMAL HIGH (ref 8–23)
CO2: 33 mmol/L — ABNORMAL HIGH (ref 22–32)
Calcium: 9.6 mg/dL (ref 8.9–10.3)
Chloride: 101 mmol/L (ref 98–111)
Creatinine, Ser: 1.19 mg/dL (ref 0.61–1.24)
GFR, Estimated: 59 mL/min — ABNORMAL LOW (ref >60)
Glucose, Bld: 178 mg/dL — ABNORMAL HIGH (ref 70–99)
Potassium: 3.3 mmol/L — ABNORMAL LOW (ref 3.5–5.1)
Sodium: 139 mmol/L (ref 135–145)

## 2022-05-18 LAB — MAGNESIUM: Magnesium: 2 mg/dL (ref 1.7–2.4)

## 2022-05-24 ENCOUNTER — Telehealth: Payer: Self-pay

## 2022-05-24 DIAGNOSIS — I10 Essential (primary) hypertension: Secondary | ICD-10-CM | POA: Diagnosis not present

## 2022-05-24 MED ORDER — POTASSIUM CHLORIDE CRYS ER 20 MEQ PO TBCR: 40.0000 meq | EXTENDED_RELEASE_TABLET | Freq: Two times a day (BID) | ORAL | 3 refills | Status: DC

## 2022-05-24 NOTE — Telephone Encounter (Signed)
-----   Message from Arnoldo Lenis, MD sent at 05/24/2022  3:44 PM EDT ----- Potassium is low, can he increased his potassium to 82mq daily. Has the swelling improved at all on the higher dose of lasix?  JZandra AbtsMD

## 2022-05-24 NOTE — Telephone Encounter (Signed)
Pt/spouse notified and verbalized understanding. Pt stated that swelling has improved on the higher dose of lasix. PCP copied.

## 2022-06-15 ENCOUNTER — Encounter (INDEPENDENT_AMBULATORY_CARE_PROVIDER_SITE_OTHER): Payer: Medicare Other | Admitting: Ophthalmology

## 2022-06-15 DIAGNOSIS — H34832 Tributary (branch) retinal vein occlusion, left eye, with macular edema: Secondary | ICD-10-CM | POA: Diagnosis not present

## 2022-06-15 DIAGNOSIS — H35033 Hypertensive retinopathy, bilateral: Secondary | ICD-10-CM | POA: Diagnosis not present

## 2022-06-15 DIAGNOSIS — H353132 Nonexudative age-related macular degeneration, bilateral, intermediate dry stage: Secondary | ICD-10-CM | POA: Diagnosis not present

## 2022-06-15 DIAGNOSIS — H43813 Vitreous degeneration, bilateral: Secondary | ICD-10-CM

## 2022-06-15 DIAGNOSIS — I10 Essential (primary) hypertension: Secondary | ICD-10-CM

## 2022-06-23 DIAGNOSIS — I10 Essential (primary) hypertension: Secondary | ICD-10-CM | POA: Diagnosis not present

## 2022-06-27 ENCOUNTER — Telehealth: Payer: Self-pay | Admitting: Cardiology

## 2022-06-27 MED ORDER — FUROSEMIDE 40 MG PO TABS: 60.0000 mg | ORAL_TABLET | Freq: Every day | ORAL | 3 refills | Status: AC

## 2022-06-27 NOTE — Telephone Encounter (Signed)
*  STAT* If patient is at the pharmacy, call can be transferred to refill team.   1. Which medications need to be refilled? (please list name of each medication and dose if known) furosemide (LASIX) 60  MG tablet  2. Which pharmacy/location (including street and city if local pharmacy) is medication to be sent to? [place]  3. Do they need a 30 day or 90 day supply? 90 day

## 2022-06-27 NOTE — Telephone Encounter (Signed)
Refill complete 

## 2022-06-27 NOTE — Addendum Note (Signed)
Addended by: Levonne Hubert on: 06/27/2022 02:39 PM   Modules accepted: Orders

## 2022-07-04 ENCOUNTER — Other Ambulatory Visit: Payer: Self-pay

## 2022-07-04 MED ORDER — POTASSIUM CHLORIDE CRYS ER 20 MEQ PO TBCR: 40.0000 meq | EXTENDED_RELEASE_TABLET | Freq: Every day | ORAL | 3 refills | Status: DC

## 2022-07-06 ENCOUNTER — Telehealth: Payer: Self-pay | Admitting: Cardiology

## 2022-07-06 MED ORDER — POTASSIUM CHLORIDE CRYS ER 20 MEQ PO TBCR: 40.0000 meq | EXTENDED_RELEASE_TABLET | Freq: Every day | ORAL | 3 refills | Status: DC

## 2022-07-06 NOTE — Telephone Encounter (Signed)
Complete

## 2022-07-06 NOTE — Telephone Encounter (Signed)
*  STAT* If patient is at the pharmacy, call can be transferred to refill team.   1. Which medications need to be refilled? (please list name of each medication and dose if known) potassium chloride SA (KLOR-CON M) 20 MEQ tablet  2. Which pharmacy/location (including street and city if local pharmacy) is medication to be sent to? [place], [place]  3. Do they need a 30 day or 90 day supply? 90 day

## 2022-07-11 ENCOUNTER — Ambulatory Visit (INDEPENDENT_AMBULATORY_CARE_PROVIDER_SITE_OTHER): Payer: Medicare Other

## 2022-07-11 DIAGNOSIS — I495 Sick sinus syndrome: Secondary | ICD-10-CM | POA: Diagnosis not present

## 2022-07-13 LAB — CUP PACEART REMOTE DEVICE CHECK
Battery Remaining Longevity: 18 mo
Battery Remaining Percentage: 16 %
Battery Voltage: 2.89 V
Brady Statistic RV Percent Paced: 75 %
Date Time Interrogation Session: 20230717075035
Implantable Lead Implant Date: 20140919
Implantable Lead Implant Date: 20140919
Implantable Lead Location: 753859
Implantable Lead Location: 753860
Implantable Pulse Generator Implant Date: 20140919
Lead Channel Impedance Value: 380 Ohm
Lead Channel Pacing Threshold Amplitude: 0.75 V
Lead Channel Pacing Threshold Pulse Width: 0.5 ms
Lead Channel Sensing Intrinsic Amplitude: 4.3 mV
Lead Channel Setting Pacing Amplitude: 2.5 V
Lead Channel Setting Pacing Pulse Width: 0.5 ms
Lead Channel Setting Sensing Sensitivity: 2 mV
Pulse Gen Model: 2240
Pulse Gen Serial Number: 7536055

## 2022-07-25 DIAGNOSIS — I10 Essential (primary) hypertension: Secondary | ICD-10-CM | POA: Diagnosis not present

## 2022-08-10 NOTE — Progress Notes (Signed)
Remote pacemaker transmission.   

## 2022-08-24 DIAGNOSIS — I10 Essential (primary) hypertension: Secondary | ICD-10-CM | POA: Diagnosis not present

## 2022-08-25 DIAGNOSIS — I739 Peripheral vascular disease, unspecified: Secondary | ICD-10-CM | POA: Diagnosis not present

## 2022-08-25 DIAGNOSIS — M79675 Pain in left toe(s): Secondary | ICD-10-CM | POA: Diagnosis not present

## 2022-08-25 DIAGNOSIS — L110 Acquired keratosis follicularis: Secondary | ICD-10-CM | POA: Diagnosis not present

## 2022-08-25 DIAGNOSIS — M79674 Pain in right toe(s): Secondary | ICD-10-CM | POA: Diagnosis not present

## 2022-08-25 DIAGNOSIS — M79671 Pain in right foot: Secondary | ICD-10-CM | POA: Diagnosis not present

## 2022-08-25 DIAGNOSIS — M79672 Pain in left foot: Secondary | ICD-10-CM | POA: Diagnosis not present

## 2022-08-25 DIAGNOSIS — E1140 Type 2 diabetes mellitus with diabetic neuropathy, unspecified: Secondary | ICD-10-CM | POA: Diagnosis not present

## 2022-08-31 DIAGNOSIS — Z299 Encounter for prophylactic measures, unspecified: Secondary | ICD-10-CM | POA: Diagnosis not present

## 2022-08-31 DIAGNOSIS — F0390 Unspecified dementia without behavioral disturbance: Secondary | ICD-10-CM | POA: Diagnosis not present

## 2022-08-31 DIAGNOSIS — Z23 Encounter for immunization: Secondary | ICD-10-CM | POA: Diagnosis not present

## 2022-08-31 DIAGNOSIS — I10 Essential (primary) hypertension: Secondary | ICD-10-CM | POA: Diagnosis not present

## 2022-08-31 DIAGNOSIS — E1165 Type 2 diabetes mellitus with hyperglycemia: Secondary | ICD-10-CM | POA: Diagnosis not present

## 2022-09-08 ENCOUNTER — Telehealth: Payer: Self-pay | Admitting: Cardiology

## 2022-09-08 NOTE — Telephone Encounter (Signed)
Spoke to pt's wife who agreed to come pick PAF form for Eliquis up from [place] office with samples.  Eliquis #14 Lot: [id number] Exp: 03/2024

## 2022-09-08 NOTE — Telephone Encounter (Signed)
Pt c/o medication issue:  1. Name of Medication: apixaban (ELIQUIS) 5 MG TABS tablet  2. How are you currently taking this medication (dosage and times per day)? As prescribed   3. Are you having a reaction (difficulty breathing--STAT)?    4. What is your medication issue? Pt states that they are now in donut hole and would like a call back to discuss if they are able to get medication samples or some type of financial assistance for this until their insurance can cover it again. Please advise.

## 2022-09-16 ENCOUNTER — Telehealth: Payer: Self-pay | Admitting: *Deleted

## 2022-09-16 NOTE — Telephone Encounter (Signed)
Pt has been approved for the [employer] patient assistance foundation through 12/25/22. Wife notified that pt was approved.

## 2022-09-23 DIAGNOSIS — I10 Essential (primary) hypertension: Secondary | ICD-10-CM | POA: Diagnosis not present

## 2022-10-10 ENCOUNTER — Ambulatory Visit (INDEPENDENT_AMBULATORY_CARE_PROVIDER_SITE_OTHER): Payer: Medicare Other

## 2022-10-10 DIAGNOSIS — I495 Sick sinus syndrome: Secondary | ICD-10-CM

## 2022-10-11 LAB — CUP PACEART REMOTE DEVICE CHECK
Battery Remaining Longevity: 17 mo
Battery Remaining Percentage: 14 %
Battery Voltage: 2.86 V
Brady Statistic RV Percent Paced: 76 %
Date Time Interrogation Session: 20231016020030
Implantable Lead Implant Date: 20140919
Implantable Lead Implant Date: 20140919
Implantable Lead Location: 753859
Implantable Lead Location: 753860
Implantable Pulse Generator Implant Date: 20140919
Lead Channel Impedance Value: 400 Ohm
Lead Channel Pacing Threshold Amplitude: 0.75 V
Lead Channel Pacing Threshold Pulse Width: 0.5 ms
Lead Channel Sensing Intrinsic Amplitude: 4.8 mV
Lead Channel Setting Pacing Amplitude: 2.5 V
Lead Channel Setting Pacing Pulse Width: 0.5 ms
Lead Channel Setting Sensing Sensitivity: 2 mV
Pulse Gen Model: 2240
Pulse Gen Serial Number: 7536055

## 2022-10-13 ENCOUNTER — Telehealth: Payer: Self-pay | Admitting: *Deleted

## 2022-10-13 NOTE — Chronic Care Management (AMB) (Signed)
  Care Coordination  Outreach Note  10/13/2022 Name: [name] MRN: [id number] DOB: 10/23/1933   Care Coordination Outreach Attempts: An unsuccessful telephone outreach was attempted today to offer the patient information about available care coordination services as a benefit of their health plan.   Follow Up Plan:  Additional outreach attempts will be made to offer the patient care coordination information and services.   Encounter Outcome:  No Answer  [place]  Direct Dial: [phone]

## 2022-10-14 NOTE — Chronic Care Management (AMB) (Signed)
  Care Coordination   Note   10/14/2022 Name: [name] MRN: [id number] DOB: 04/29/1933  [name] is a 88 y.o. year old male who sees Vyas, Costella Hatcher, MD for primary care. I reached out to Darreld Mclean by phone today to offer care coordination services.  [name] was given information about Care Coordination services today including:   The Care Coordination services include support from the care team which includes your Nurse Coordinator, Clinical Social Worker, or Pharmacist.  The Care Coordination team is here to help remove barriers to the health concerns and goals most important to you. Care Coordination services are voluntary, and the patient may decline or stop services at any time by request to their care team member.   Care Coordination Consent Status: Patient spouse [name] agreed to services and verbal consent obtained.   Follow up plan:  Telephone appointment with care coordination team member scheduled for:  10/20/22  Encounter Outcome:  Pt. Scheduled  [place]  Direct Dial: [phone]

## 2022-10-20 ENCOUNTER — Encounter: Payer: Self-pay | Admitting: *Deleted

## 2022-10-20 ENCOUNTER — Ambulatory Visit: Payer: Self-pay | Admitting: *Deleted

## 2022-10-20 NOTE — Patient Outreach (Signed)
  Care Coordination   Initial Visit Note   10/20/2022 Name: [name] MRN: [id number] DOB: May 29, 1933  [name] is a 88 y.o. year old male who sees Vyas, Dhruv B, MD for primary care. I spoke with  Lucita Ferrara, wife of [name] by phone today.  What matters to the patients health and wellness today?  Wife is member's caregiver, state member is doing well, does not have any questions.  Denies need for another follow up at this time.    Goals Addressed             This Visit's Progress    COMPLETED: Care Coordination Activities - No follow up needed       Care Coordination Interventions: Evaluation of current treatment plan related to Dementia and patient's adherence to plan as established by provider Advised patient to continue monitoring blood pressure and blood sugar daily Reviewed medications with patient and discussed affordability - receiving assistance from [facility] directly Reviewed scheduled/upcoming provider appointments including previous visit on 9/6 and next appointment in December Assessed social determinant of health barriers Discussed support system in the home - wife, daughter lives next door         SDOH assessments and interventions completed:  Yes  SDOH Interventions Today    Flowsheet Row Most Recent Value  SDOH Interventions   Food Insecurity Interventions Intervention Not Indicated  Housing Interventions Intervention Not Indicated  Transportation Interventions Intervention Not Indicated  Utilities Interventions Intervention Not Indicated        Care Coordination Interventions Activated:  Yes  Care Coordination Interventions:  Yes, provided   Follow up plan: No further intervention required.   Encounter Outcome:  Pt. Visit Completed   Valente David, RN, MSN, [place] Care Management Care Management Coordinator [phone]

## 2022-10-24 DIAGNOSIS — I10 Essential (primary) hypertension: Secondary | ICD-10-CM | POA: Diagnosis not present

## 2022-11-03 ENCOUNTER — Encounter (INDEPENDENT_AMBULATORY_CARE_PROVIDER_SITE_OTHER): Payer: Medicare Other | Admitting: Ophthalmology

## 2022-11-03 DIAGNOSIS — H43813 Vitreous degeneration, bilateral: Secondary | ICD-10-CM

## 2022-11-03 DIAGNOSIS — H353132 Nonexudative age-related macular degeneration, bilateral, intermediate dry stage: Secondary | ICD-10-CM | POA: Diagnosis not present

## 2022-11-03 DIAGNOSIS — H35033 Hypertensive retinopathy, bilateral: Secondary | ICD-10-CM | POA: Diagnosis not present

## 2022-11-03 DIAGNOSIS — I10 Essential (primary) hypertension: Secondary | ICD-10-CM

## 2022-11-03 DIAGNOSIS — H34832 Tributary (branch) retinal vein occlusion, left eye, with macular edema: Secondary | ICD-10-CM | POA: Diagnosis not present

## 2022-11-03 NOTE — Progress Notes (Signed)
Remote pacemaker transmission.   

## 2022-11-15 ENCOUNTER — Ambulatory Visit: Payer: Medicare Other | Attending: Cardiology | Admitting: Cardiology

## 2022-11-15 ENCOUNTER — Encounter: Payer: Self-pay | Admitting: Cardiology

## 2022-11-15 ENCOUNTER — Encounter: Payer: Self-pay | Admitting: Internal Medicine

## 2022-11-15 VITALS — BP 140/90 | HR 70 | Ht 71.0 in | Wt 153.0 lb

## 2022-11-15 DIAGNOSIS — I5032 Chronic diastolic (congestive) heart failure: Secondary | ICD-10-CM | POA: Insufficient documentation

## 2022-11-15 DIAGNOSIS — I10 Essential (primary) hypertension: Secondary | ICD-10-CM | POA: Insufficient documentation

## 2022-11-15 DIAGNOSIS — I2510 Atherosclerotic heart disease of native coronary artery without angina pectoris: Secondary | ICD-10-CM

## 2022-11-15 DIAGNOSIS — Z950 Presence of cardiac pacemaker: Secondary | ICD-10-CM | POA: Insufficient documentation

## 2022-11-15 DIAGNOSIS — I480 Paroxysmal atrial fibrillation: Secondary | ICD-10-CM | POA: Diagnosis not present

## 2022-11-15 DIAGNOSIS — R0602 Shortness of breath: Secondary | ICD-10-CM | POA: Diagnosis not present

## 2022-11-15 NOTE — Progress Notes (Signed)
Clinical Summary Mr. Ferrara is a 88 y.o.male seen today for follow up of the following medical problems.    1. Mild systolic dysfunction   - echo 07/2012 w/ LVEF 40-45%   - Cath 08/2013 w/ minimal CAD, LV gram 60% with normal filling pressures on RHC.     2018 echo LVEF 55-60%.      - We have avoided aggressive diuretic dosing due to chronic orthostatic dizziness.  - taking lasix '60mg'$  daily, we had increased at last visit - some SOB at times, up and down.  - no orthopnea.  - has had some increased SOB.      2. HTN    - accepting higher bp's due to prior dizziness  - previously stopped norvasc to see if would help with LE edema   -toprol started by EP, increased by pcp to '50mg'$  daily.        3. Bradycardia   -s/p St Jude dual chamber pacemaker   - followed by EP -normal check 09/2022   03/2022 normal device   4. Hyperlipidemia -labs are followed by pcp     5. Afib - infrequent palpitations.  - no palpitatoins   6. CAD   - patent major arteries with evidence of disease in diags and OMs from prior cath   - no recent chest pains.   7. Dementia   SH: wife on chemo and radiation along with recent hysterectomy, endometrial cancer. Just completed treatments, told she is cancer free     Past Medical History:  Diagnosis Date   Allergic rhinitis    Anemia    Anxiety    Arthritis    "all over my body" (09/10/2013)   BPH (benign prostatic hypertrophy)    CAD (coronary artery disease)    a. 08/2013 LM nl, LAD 66m D1 80ost, 95p, LCX 277mRCA 20d, EF 60%.   Chronic lower back pain    Depression    Diabetes mellitus    "diet controlled" (09/10/2013)   Diaphragmatic hernia without mention of obstruction or gangrene    Diastolic congestive heart failure (HCC)    Elevated liver function tests    Essential hypertension, benign    [place]   a. 08/2013 post-cath, MRI: sm acute lacunar infarcts in LPCA and LSCA, ? tiny RPICA lacunar infarct.   Obesity     OSA on CPAP    "wears mask part of the time" (09/10/2013)   Osteoporosis    [place]   a. 08/2013 s/p SJM DC PPM.     Allergies  Allergen Reactions   Fentanyl Other (See Comments)    resp suppression per wife, out for 2 days      Current Outpatient Medications  Medication Sig Dispense Refill   acetaminophen (TYLENOL) 500 MG tablet Take 500 mg by mouth every 6 (six) hours as needed for pain.     alendronate (FOSAMAX) 70 MG tablet Take 70 mg by mouth every 7 (seven) days. Take with a full glass of water on an empty stomach. On Mondays     apixaban (ELIQUIS) 5 MG TABS tablet Take 1 tablet (5 mg total) by mouth 2 (two) times daily. 180 tablet 1   Calcium Carbonate-Vitamin D (CALCIUM 600 + D PO) Take 600 mg by mouth 2 (two) times daily.     donepezil (ARICEPT) 5 MG tablet Take 10 mg by mouth daily.     dorzolamide-timolol (COSOPT) 22.3-6.8 MG/ML ophthalmic solution  USE ONE DROP IN LEFT EYE TWICE DAILY     enalapril (VASOTEC) 20 MG tablet Take 20 mg by mouth 2 (two) times daily.     escitalopram (LEXAPRO) 5 MG tablet Take 10 mg by mouth daily.     fish oil-omega-3 fatty acids 1000 MG capsule Take 1 g by mouth 2 (two) times daily.     furosemide (LASIX) 40 MG tablet Take 1.5 tablets (60 mg total) by mouth daily. 135 tablet 3   isosorbide mononitrate (IMDUR) 30 MG 24 hr tablet Take 30 mg by mouth daily.     memantine (NAMENDA) 5 MG tablet Take 5 mg by mouth 2 (two) times daily.     metFORMIN (GLUCOPHAGE) 500 MG tablet Take 500 mg by mouth daily with breakfast.     metoprolol succinate (TOPROL-XL) 50 MG 24 hr tablet Take 50 mg by mouth daily.     Multiple Vitamin (MULTIVITAMIN WITH MINERALS) TABS tablet Take 1 tablet by mouth daily.     nitroGLYCERIN (NITROSTAT) 0.4 MG SL tablet Place 1 tablet (0.4 mg total) under the tongue every 5 (five) minutes x 3 doses as needed for chest pain. 25 tablet 3   potassium chloride SA (KLOR-CON M) 20 MEQ tablet Take 2 tablets (40 mEq  total) by mouth daily. 180 tablet 3   tamsulosin (FLOMAX) 0.4 MG CAPS capsule Take 0.4 mg by mouth daily.      traMADol (ULTRAM) 50 MG tablet Take 50 mg by mouth every 6 (six) hours as needed.      No current facility-administered medications for this visit.     Past Surgical History:  Procedure Laterality Date   CARDIAC CATHETERIZATION  1998   CATARACT EXTRACTION W/ INTRAOCULAR LENS  IMPLANT, BILATERAL  2010   LEFT AND RIGHT HEART CATHETERIZATION WITH CORONARY ANGIOGRAM N/A 09/11/2013   Procedure: LEFT AND RIGHT HEART CATHETERIZATION WITH CORONARY ANGIOGRAM;  Surgeon: Wellington Hampshire, MD;  Location: [facility] CATH LAB;  Service: Cardiovascular;  Laterality: N/A;   PERMANENT PACEMAKER INSERTION N/A 09/13/2013   Procedure: PERMANENT PACEMAKER INSERTION;  Surgeon: Evans Lance, MD;  Location: [facility] C[facility] LAB;  Service: Cardiovascular;  Laterality: N/A;   right hip fx  05/26/2016   right wrist fx  05/26/2016   STOMACH SURGERY  03/02/2012   ?volvus; "stomach came loose and twisted; went up under rib cage; had that corrected; tacked it up" (09/10/2013)     Allergies  Allergen Reactions   Fentanyl Other (See Comments)    resp suppression per wife, out for 2 days       Family History  Problem Relation Age of Onset   Hypertension Father    Hypertension Mother      Social History Mr. Eppinger reports that he has never smoked. He has never used smokeless tobacco. Mr. Mula reports no history of alcohol use.   Review of Systems CONSTITUTIONAL: No weight loss, fever, chills, weakness or fatigue.  HEENT: Eyes: No visual loss, blurred vision, double vision or yellow sclerae.No hearing loss, sneezing, congestion, runny nose or sore throat.  SKIN: No rash or itching.  CARDIOVASCULAR: per hpi RESPIRATORY: per hpi GASTROINTESTINAL: No anorexia, nausea, vomiting or diarrhea. No abdominal pain or blood.  GENITOURINARY: No burning on urination, no polyuria NEUROLOGICAL: No headache, dizziness, syncope,  paralysis, ataxia, numbness or tingling in the extremities. No change in bowel or bladder control.  MUSCULOSKELETAL: No muscle, back pain, joint pain or stiffness.  LYMPHATICS: No enlarged nodes. No history of splenectomy.  PSYCHIATRIC: No  history of depression or anxiety.  ENDOCRINOLOGIC: No reports of sweating, cold or heat intolerance. No polyuria or polydipsia.  Marland Kitchen   Physical Examination Today's Vitals   11/15/22 1032  BP: (!) 146/90  Pulse: 70  Weight: 153 lb (69.4 kg)  Height: '5\' 11"'$  (1.803 m)   Body mass index is 21.34 kg/m.  Gen: resting comfortably, no acute distress HEENT: no scleral icterus, pupils equal round and reactive, no palptable cervical adenopathy,  CV: RRR, no m/r/g no jvd Resp: Clear to auscultation bilaterally GI: abdomen is soft, non-tender, non-distended, normal bowel sounds, no hepatosplenomegaly MSK: extremities are warm, 1-2+ bilateral LE edema Skin: warm, no rash Neuro:  no focal deficits Psych: appropriate affect   Diagnostic Studies  Cardiac Catheterization Sep 21, 2013   Procedural Findings:   Hemodynamics   RA 3 mmHg   RV 37/1 mmHg   PA 33/12 mmHg   PCWP 6 mmHg   LV 201/4 mHg . LVEDP: 12 mmHg   AO 202/94 mmHg   Oxygen saturations:   PA 74%   AO 94%   Cardiac Output (Fick) 6.72   Cardiac Index (Fick) 3.22   Pulmonary vascular resistance (PVR): 1.9 Woods units.   Coronary angiography:   Coronary dominance: right   Left Main: Normal in size with no significant disease.   Left Anterior Descending (LAD): Large in size with mild diffuse atherosclerosis. There is diffuse 20% disease in the midsegment.   1st diagonal (D1): Normal in size with 80% ostial stenosis followed by a 95% proximal stenosis.   2nd diagonal (D2): Medium in size with minor irregularities.   3rd diagonal (D3): Normal in size with no significant disease.   Circumflex (LCx): Normal in size and nondominant. There is 20% mid stenosis.   1st obtuse marginal: Medium in size  with minor irregularities.   2nd obtuse marginal: Large is in size with no significant disease.   3rd obtuse marginal: Normal in size with minor irregularities.   Right Coronary Artery: large in size and dominant. There is 20% stenosis distally.   Posterior descending artery: normal in size with no significant disease.   Posterior AV segment: normal in size with no significant disease.   Posterolateral branchs: PL 1 is large with no significant disease. PL 2 is small. Left ventriculography: Left ventricular systolic function is normal , LVEF is estimated at 60 %, there is no significant mitral regurgitation     07/2012 Echo: mild LVH, LVEF 40-45%, LAE, mild MR,    Assessment and Plan   1. History of cardiomyopathy/Chronic diastolic heart failiure - LVEF has since normalized, still with some diastolic dysfunction -. Due to chronic orthostatic symptoms have been cautious with diuretics - ongoign significant LE edema, some recent SOB/DOE - will repeat echo, continue lasix at current dose.    2. CAD   - patent major arteries with evidence of disease in diags and OMs from prior cath -no symptoms, continue to monitor   3. HTN   - accepting high bp's for him at this time due to prior dizziness - bp reasonable for him today given advanced age and chronic orthostatic dizziness   4. Bradycardia/Pacemaker - followed by EP, recent normal device check - continue to montir     5. Hyperlipidemia -we will request pcp labs   6. Afib/acquired thrombophilia - no symptoms, continue current meds. Continue eliquis for stroke prevention      F/u 6 months   Arnoldo Lenis, M.D

## 2022-11-15 NOTE — Patient Instructions (Addendum)
Medication Instructions:  Your physician recommends that you continue on your current medications as directed. Please refer to the Current Medication list given to you today.   Labwork: None  Testing/Procedures: Your physician has requested that you have an echocardiogram. Echocardiography is a painless test that uses sound waves to create images of your heart. It provides your doctor with information about the size and shape of your heart and how well your heart's chambers and valves are working. This procedure takes approximately one hour. There are no restrictions for this procedure. Please do NOT wear cologne, perfume, aftershave, or lotions (deodorant is allowed). Please arrive 15 minutes prior to your appointment time.   Follow-Up: Follow up with Dr. Branch in 6 months.   Any Other Special Instructions Will Be Listed Below (If Applicable).     If you need a refill on your cardiac medications before your next appointment, please call your pharmacy.  

## 2022-11-23 DIAGNOSIS — I10 Essential (primary) hypertension: Secondary | ICD-10-CM | POA: Diagnosis not present

## 2022-11-29 DIAGNOSIS — M79675 Pain in left toe(s): Secondary | ICD-10-CM | POA: Diagnosis not present

## 2022-11-29 DIAGNOSIS — L110 Acquired keratosis follicularis: Secondary | ICD-10-CM | POA: Diagnosis not present

## 2022-11-29 DIAGNOSIS — M79672 Pain in left foot: Secondary | ICD-10-CM | POA: Diagnosis not present

## 2022-11-29 DIAGNOSIS — M79674 Pain in right toe(s): Secondary | ICD-10-CM | POA: Diagnosis not present

## 2022-11-29 DIAGNOSIS — I739 Peripheral vascular disease, unspecified: Secondary | ICD-10-CM | POA: Diagnosis not present

## 2022-11-29 DIAGNOSIS — E1140 Type 2 diabetes mellitus with diabetic neuropathy, unspecified: Secondary | ICD-10-CM | POA: Diagnosis not present

## 2022-11-29 DIAGNOSIS — M79671 Pain in right foot: Secondary | ICD-10-CM | POA: Diagnosis not present

## 2022-12-07 DIAGNOSIS — R5383 Other fatigue: Secondary | ICD-10-CM | POA: Diagnosis not present

## 2022-12-07 DIAGNOSIS — Z299 Encounter for prophylactic measures, unspecified: Secondary | ICD-10-CM | POA: Diagnosis not present

## 2022-12-07 DIAGNOSIS — E039 Hypothyroidism, unspecified: Secondary | ICD-10-CM | POA: Diagnosis not present

## 2022-12-07 DIAGNOSIS — Z7189 Other specified counseling: Secondary | ICD-10-CM | POA: Diagnosis not present

## 2022-12-07 DIAGNOSIS — E1165 Type 2 diabetes mellitus with hyperglycemia: Secondary | ICD-10-CM | POA: Diagnosis not present

## 2022-12-07 DIAGNOSIS — Z1331 Encounter for screening for depression: Secondary | ICD-10-CM | POA: Diagnosis not present

## 2022-12-07 DIAGNOSIS — Z125 Encounter for screening for malignant neoplasm of prostate: Secondary | ICD-10-CM | POA: Diagnosis not present

## 2022-12-07 DIAGNOSIS — I10 Essential (primary) hypertension: Secondary | ICD-10-CM | POA: Diagnosis not present

## 2022-12-07 DIAGNOSIS — E7800 Pure hypercholesterolemia, unspecified: Secondary | ICD-10-CM | POA: Diagnosis not present

## 2022-12-07 DIAGNOSIS — Z6821 Body mass index (BMI) 21.0-21.9, adult: Secondary | ICD-10-CM | POA: Diagnosis not present

## 2022-12-07 DIAGNOSIS — Z789 Other specified health status: Secondary | ICD-10-CM | POA: Diagnosis not present

## 2022-12-07 DIAGNOSIS — Z1339 Encounter for screening examination for other mental health and behavioral disorders: Secondary | ICD-10-CM | POA: Diagnosis not present

## 2022-12-07 DIAGNOSIS — Z0000 Encounter for general adult medical examination without abnormal findings: Secondary | ICD-10-CM | POA: Diagnosis not present

## 2022-12-07 DIAGNOSIS — Z79899 Other long term (current) drug therapy: Secondary | ICD-10-CM | POA: Diagnosis not present

## 2022-12-23 DIAGNOSIS — I10 Essential (primary) hypertension: Secondary | ICD-10-CM | POA: Diagnosis not present

## 2023-01-02 ENCOUNTER — Other Ambulatory Visit (HOSPITAL_COMMUNITY): Payer: Medicare Other

## 2023-01-03 ENCOUNTER — Ambulatory Visit (HOSPITAL_COMMUNITY)
Admission: RE | Admit: 2023-01-03 | Discharge: 2023-01-03 | Disposition: A | Discharge status: Home / Self Care | Payer: Medicare Other | Source: Ambulatory Visit | Attending: Cardiology | Admitting: Cardiology

## 2023-01-03 DIAGNOSIS — R0602 Shortness of breath: Secondary | ICD-10-CM | POA: Diagnosis not present

## 2023-01-03 LAB — ECHOCARDIOGRAM COMPLETE
Area-P 1/2: 5.02 cm2
MV M vel: 4.87 m/s
MV Peak grad: 94.9 mmHg
S' Lateral: 3.4 cm

## 2023-01-03 NOTE — Progress Notes (Signed)
*  PRELIMINARY RESULTS* Echocardiogram 2D Echocardiogram has been performed.  [name] 01/03/2023, 12:38 PM

## 2023-01-09 ENCOUNTER — Ambulatory Visit (INDEPENDENT_AMBULATORY_CARE_PROVIDER_SITE_OTHER): Payer: Medicare Other

## 2023-01-09 DIAGNOSIS — I495 Sick sinus syndrome: Secondary | ICD-10-CM | POA: Diagnosis not present

## 2023-01-10 ENCOUNTER — Telehealth: Payer: Self-pay | Admitting: Cardiology

## 2023-01-10 NOTE — Telephone Encounter (Signed)
Patient's wife is calling about results. Please call back

## 2023-01-10 NOTE — Telephone Encounter (Signed)
Echo done 01/03/23   I weill message Dr.Branch

## 2023-01-11 DIAGNOSIS — W19XXXA Unspecified fall, initial encounter: Secondary | ICD-10-CM | POA: Diagnosis not present

## 2023-01-11 DIAGNOSIS — R52 Pain, unspecified: Secondary | ICD-10-CM | POA: Diagnosis not present

## 2023-01-11 DIAGNOSIS — F0390 Unspecified dementia without behavioral disturbance: Secondary | ICD-10-CM | POA: Diagnosis not present

## 2023-01-11 LAB — CUP PACEART REMOTE DEVICE CHECK
Battery Remaining Longevity: 14 mo
Battery Remaining Percentage: 12 %
Battery Voltage: 2.86 V
Brady Statistic RV Percent Paced: 78 %
Date Time Interrogation Session: 20240115121119
Implantable Lead Connection Status: 753985
Implantable Lead Connection Status: 753985
Implantable Lead Implant Date: 20140919
Implantable Lead Implant Date: 20140919
Implantable Lead Location: 753859
Implantable Lead Location: 753860
Implantable Pulse Generator Implant Date: 20140919
Lead Channel Impedance Value: 410 Ohm
Lead Channel Pacing Threshold Amplitude: 0.75 V
Lead Channel Pacing Threshold Pulse Width: 0.5 ms
Lead Channel Sensing Intrinsic Amplitude: 4.3 mV
Lead Channel Setting Pacing Amplitude: 2.5 V
Lead Channel Setting Pacing Pulse Width: 0.5 ms
Lead Channel Setting Sensing Sensitivity: 2 mV
Pulse Gen Model: 2240
Pulse Gen Serial Number: 7536055

## 2023-01-11 NOTE — Telephone Encounter (Signed)
Patients wife notified and verbalized understanding. Patients wife had no questions or concerns at this time.

## 2023-01-11 NOTE — Telephone Encounter (Signed)
Echo shows normal heart pumping function, there is evidence of some heart stiffness which is related to aging and has been present on prior echos. No worrisome new findings   Zandra Abts MD

## 2023-01-13 DIAGNOSIS — F339 Major depressive disorder, recurrent, unspecified: Secondary | ICD-10-CM | POA: Diagnosis not present

## 2023-01-13 DIAGNOSIS — M25462 Effusion, left knee: Secondary | ICD-10-CM | POA: Diagnosis not present

## 2023-01-13 DIAGNOSIS — Z7401 Bed confinement status: Secondary | ICD-10-CM | POA: Diagnosis not present

## 2023-01-13 DIAGNOSIS — M47812 Spondylosis without myelopathy or radiculopathy, cervical region: Secondary | ICD-10-CM | POA: Diagnosis not present

## 2023-01-13 DIAGNOSIS — R059 Cough, unspecified: Secondary | ICD-10-CM | POA: Diagnosis not present

## 2023-01-13 DIAGNOSIS — Z950 Presence of cardiac pacemaker: Secondary | ICD-10-CM | POA: Diagnosis not present

## 2023-01-13 DIAGNOSIS — R531 Weakness: Secondary | ICD-10-CM | POA: Diagnosis not present

## 2023-01-13 DIAGNOSIS — E876 Hypokalemia: Secondary | ICD-10-CM | POA: Diagnosis not present

## 2023-01-13 DIAGNOSIS — S32010A Wedge compression fracture of first lumbar vertebra, initial encounter for closed fracture: Secondary | ICD-10-CM | POA: Diagnosis not present

## 2023-01-13 DIAGNOSIS — R41841 Cognitive communication deficit: Secondary | ICD-10-CM | POA: Diagnosis not present

## 2023-01-13 DIAGNOSIS — R54 Age-related physical debility: Secondary | ICD-10-CM | POA: Diagnosis present

## 2023-01-13 DIAGNOSIS — I482 Chronic atrial fibrillation, unspecified: Secondary | ICD-10-CM | POA: Diagnosis not present

## 2023-01-13 DIAGNOSIS — Z7983 Long term (current) use of bisphosphonates: Secondary | ICD-10-CM | POA: Diagnosis not present

## 2023-01-13 DIAGNOSIS — I5032 Chronic diastolic (congestive) heart failure: Secondary | ICD-10-CM | POA: Diagnosis not present

## 2023-01-13 DIAGNOSIS — R030 Elevated blood-pressure reading, without diagnosis of hypertension: Secondary | ICD-10-CM | POA: Diagnosis not present

## 2023-01-13 DIAGNOSIS — R296 Repeated falls: Secondary | ICD-10-CM | POA: Diagnosis not present

## 2023-01-13 DIAGNOSIS — E870 Hyperosmolality and hypernatremia: Secondary | ICD-10-CM | POA: Diagnosis not present

## 2023-01-13 DIAGNOSIS — R0781 Pleurodynia: Secondary | ICD-10-CM | POA: Diagnosis not present

## 2023-01-13 DIAGNOSIS — Z7901 Long term (current) use of anticoagulants: Secondary | ICD-10-CM | POA: Diagnosis not present

## 2023-01-13 DIAGNOSIS — R000 Tachycardia, unspecified: Secondary | ICD-10-CM | POA: Diagnosis not present

## 2023-01-13 DIAGNOSIS — I4891 Unspecified atrial fibrillation: Secondary | ICD-10-CM | POA: Diagnosis not present

## 2023-01-13 DIAGNOSIS — M47816 Spondylosis without myelopathy or radiculopathy, lumbar region: Secondary | ICD-10-CM | POA: Diagnosis not present

## 2023-01-13 DIAGNOSIS — Z79899 Other long term (current) drug therapy: Secondary | ICD-10-CM | POA: Diagnosis not present

## 2023-01-13 DIAGNOSIS — M810 Age-related osteoporosis without current pathological fracture: Secondary | ICD-10-CM | POA: Diagnosis not present

## 2023-01-13 DIAGNOSIS — M6281 Muscle weakness (generalized): Secondary | ICD-10-CM | POA: Diagnosis not present

## 2023-01-13 DIAGNOSIS — R456 Violent behavior: Secondary | ICD-10-CM | POA: Diagnosis not present

## 2023-01-13 DIAGNOSIS — Z1152 Encounter for screening for COVID-19: Secondary | ICD-10-CM | POA: Diagnosis not present

## 2023-01-13 DIAGNOSIS — S22000D Wedge compression fracture of unspecified thoracic vertebra, subsequent encounter for fracture with routine healing: Secondary | ICD-10-CM | POA: Diagnosis not present

## 2023-01-13 DIAGNOSIS — W19XXXA Unspecified fall, initial encounter: Secondary | ICD-10-CM | POA: Diagnosis not present

## 2023-01-13 DIAGNOSIS — E861 Hypovolemia: Secondary | ICD-10-CM | POA: Diagnosis not present

## 2023-01-13 DIAGNOSIS — H409 Unspecified glaucoma: Secondary | ICD-10-CM | POA: Diagnosis not present

## 2023-01-13 DIAGNOSIS — R279 Unspecified lack of coordination: Secondary | ICD-10-CM | POA: Diagnosis not present

## 2023-01-13 DIAGNOSIS — R52 Pain, unspecified: Secondary | ICD-10-CM | POA: Diagnosis not present

## 2023-01-13 DIAGNOSIS — E43 Unspecified severe protein-calorie malnutrition: Secondary | ICD-10-CM | POA: Diagnosis not present

## 2023-01-13 DIAGNOSIS — M40205 Unspecified kyphosis, thoracolumbar region: Secondary | ICD-10-CM | POA: Diagnosis not present

## 2023-01-13 DIAGNOSIS — Z8673 Personal history of transient ischemic attack (TIA), and cerebral infarction without residual deficits: Secondary | ICD-10-CM | POA: Diagnosis not present

## 2023-01-13 DIAGNOSIS — E440 Moderate protein-calorie malnutrition: Secondary | ICD-10-CM | POA: Diagnosis not present

## 2023-01-13 DIAGNOSIS — S22080A Wedge compression fracture of T11-T12 vertebra, initial encounter for closed fracture: Secondary | ICD-10-CM | POA: Diagnosis not present

## 2023-01-13 DIAGNOSIS — R404 Transient alteration of awareness: Secondary | ICD-10-CM | POA: Diagnosis not present

## 2023-01-13 DIAGNOSIS — R2681 Unsteadiness on feet: Secondary | ICD-10-CM | POA: Diagnosis not present

## 2023-01-13 DIAGNOSIS — Z20822 Contact with and (suspected) exposure to covid-19: Secondary | ICD-10-CM | POA: Diagnosis not present

## 2023-01-13 DIAGNOSIS — I5030 Unspecified diastolic (congestive) heart failure: Secondary | ICD-10-CM | POA: Diagnosis not present

## 2023-01-13 DIAGNOSIS — Z7984 Long term (current) use of oral hypoglycemic drugs: Secondary | ICD-10-CM | POA: Diagnosis not present

## 2023-01-13 DIAGNOSIS — E860 Dehydration: Secondary | ICD-10-CM | POA: Diagnosis present

## 2023-01-13 DIAGNOSIS — E119 Type 2 diabetes mellitus without complications: Secondary | ICD-10-CM | POA: Diagnosis not present

## 2023-01-13 DIAGNOSIS — F0390 Unspecified dementia without behavioral disturbance: Secondary | ICD-10-CM | POA: Diagnosis not present

## 2023-01-13 DIAGNOSIS — I10 Essential (primary) hypertension: Secondary | ICD-10-CM | POA: Diagnosis not present

## 2023-01-13 DIAGNOSIS — M179 Osteoarthritis of knee, unspecified: Secondary | ICD-10-CM | POA: Diagnosis present

## 2023-01-13 DIAGNOSIS — S22008G Other fracture of unspecified thoracic vertebra, subsequent encounter for fracture with delayed healing: Secondary | ICD-10-CM | POA: Diagnosis not present

## 2023-01-13 DIAGNOSIS — Z681 Body mass index (BMI) 19 or less, adult: Secondary | ICD-10-CM | POA: Diagnosis not present

## 2023-01-13 DIAGNOSIS — M1711 Unilateral primary osteoarthritis, right knee: Secondary | ICD-10-CM | POA: Diagnosis not present

## 2023-01-13 DIAGNOSIS — Z9849 Cataract extraction status, unspecified eye: Secondary | ICD-10-CM | POA: Diagnosis not present

## 2023-01-13 DIAGNOSIS — I110 Hypertensive heart disease with heart failure: Secondary | ICD-10-CM | POA: Diagnosis present

## 2023-01-13 DIAGNOSIS — M25461 Effusion, right knee: Secondary | ICD-10-CM | POA: Diagnosis not present

## 2023-01-13 DIAGNOSIS — Z885 Allergy status to narcotic agent status: Secondary | ICD-10-CM | POA: Diagnosis not present

## 2023-01-13 DIAGNOSIS — S22000G Wedge compression fracture of unspecified thoracic vertebra, subsequent encounter for fracture with delayed healing: Secondary | ICD-10-CM | POA: Diagnosis not present

## 2023-01-13 DIAGNOSIS — G4730 Sleep apnea, unspecified: Secondary | ICD-10-CM | POA: Diagnosis present

## 2023-01-19 DIAGNOSIS — S22000D Wedge compression fracture of unspecified thoracic vertebra, subsequent encounter for fracture with routine healing: Secondary | ICD-10-CM | POA: Diagnosis not present

## 2023-01-19 DIAGNOSIS — R41841 Cognitive communication deficit: Secondary | ICD-10-CM | POA: Diagnosis not present

## 2023-01-19 DIAGNOSIS — E440 Moderate protein-calorie malnutrition: Secondary | ICD-10-CM | POA: Diagnosis not present

## 2023-01-19 DIAGNOSIS — R52 Pain, unspecified: Secondary | ICD-10-CM | POA: Diagnosis not present

## 2023-01-19 DIAGNOSIS — D649 Anemia, unspecified: Secondary | ICD-10-CM | POA: Diagnosis not present

## 2023-01-19 DIAGNOSIS — E43 Unspecified severe protein-calorie malnutrition: Secondary | ICD-10-CM | POA: Diagnosis not present

## 2023-01-19 DIAGNOSIS — R531 Weakness: Secondary | ICD-10-CM | POA: Diagnosis not present

## 2023-01-19 DIAGNOSIS — I10 Essential (primary) hypertension: Secondary | ICD-10-CM | POA: Diagnosis not present

## 2023-01-19 DIAGNOSIS — R456 Violent behavior: Secondary | ICD-10-CM | POA: Diagnosis not present

## 2023-01-19 DIAGNOSIS — Z79899 Other long term (current) drug therapy: Secondary | ICD-10-CM | POA: Diagnosis not present

## 2023-01-19 DIAGNOSIS — I5030 Unspecified diastolic (congestive) heart failure: Secondary | ICD-10-CM | POA: Diagnosis not present

## 2023-01-19 DIAGNOSIS — E119 Type 2 diabetes mellitus without complications: Secondary | ICD-10-CM | POA: Diagnosis not present

## 2023-01-19 DIAGNOSIS — R2681 Unsteadiness on feet: Secondary | ICD-10-CM | POA: Diagnosis not present

## 2023-01-19 DIAGNOSIS — F0390 Unspecified dementia without behavioral disturbance: Secondary | ICD-10-CM | POA: Diagnosis not present

## 2023-01-19 DIAGNOSIS — E876 Hypokalemia: Secondary | ICD-10-CM | POA: Diagnosis not present

## 2023-01-19 DIAGNOSIS — M179 Osteoarthritis of knee, unspecified: Secondary | ICD-10-CM | POA: Diagnosis not present

## 2023-01-19 DIAGNOSIS — R404 Transient alteration of awareness: Secondary | ICD-10-CM | POA: Diagnosis not present

## 2023-01-19 DIAGNOSIS — I4891 Unspecified atrial fibrillation: Secondary | ICD-10-CM | POA: Diagnosis not present

## 2023-01-19 DIAGNOSIS — I5032 Chronic diastolic (congestive) heart failure: Secondary | ICD-10-CM | POA: Diagnosis not present

## 2023-01-19 DIAGNOSIS — I482 Chronic atrial fibrillation, unspecified: Secondary | ICD-10-CM | POA: Diagnosis not present

## 2023-01-19 DIAGNOSIS — H409 Unspecified glaucoma: Secondary | ICD-10-CM | POA: Diagnosis not present

## 2023-01-19 DIAGNOSIS — Z950 Presence of cardiac pacemaker: Secondary | ICD-10-CM | POA: Diagnosis not present

## 2023-01-19 DIAGNOSIS — F339 Major depressive disorder, recurrent, unspecified: Secondary | ICD-10-CM | POA: Diagnosis not present

## 2023-01-19 DIAGNOSIS — Z7901 Long term (current) use of anticoagulants: Secondary | ICD-10-CM | POA: Diagnosis not present

## 2023-01-19 DIAGNOSIS — M47816 Spondylosis without myelopathy or radiculopathy, lumbar region: Secondary | ICD-10-CM | POA: Diagnosis not present

## 2023-01-19 DIAGNOSIS — R279 Unspecified lack of coordination: Secondary | ICD-10-CM | POA: Diagnosis not present

## 2023-01-19 DIAGNOSIS — S22000G Wedge compression fracture of unspecified thoracic vertebra, subsequent encounter for fracture with delayed healing: Secondary | ICD-10-CM | POA: Diagnosis not present

## 2023-01-19 DIAGNOSIS — Z7401 Bed confinement status: Secondary | ICD-10-CM | POA: Diagnosis not present

## 2023-01-19 DIAGNOSIS — E870 Hyperosmolality and hypernatremia: Secondary | ICD-10-CM | POA: Diagnosis not present

## 2023-01-19 DIAGNOSIS — F09 Unspecified mental disorder due to known physiological condition: Secondary | ICD-10-CM | POA: Diagnosis not present

## 2023-01-19 DIAGNOSIS — M6281 Muscle weakness (generalized): Secondary | ICD-10-CM | POA: Diagnosis not present

## 2023-01-19 DIAGNOSIS — F4321 Adjustment disorder with depressed mood: Secondary | ICD-10-CM | POA: Diagnosis not present

## 2023-01-19 DIAGNOSIS — I110 Hypertensive heart disease with heart failure: Secondary | ICD-10-CM | POA: Diagnosis not present

## 2023-01-19 DIAGNOSIS — R296 Repeated falls: Secondary | ICD-10-CM | POA: Diagnosis not present

## 2023-01-23 DIAGNOSIS — I10 Essential (primary) hypertension: Secondary | ICD-10-CM | POA: Diagnosis not present

## 2023-01-23 DIAGNOSIS — E119 Type 2 diabetes mellitus without complications: Secondary | ICD-10-CM | POA: Diagnosis not present

## 2023-01-23 DIAGNOSIS — I110 Hypertensive heart disease with heart failure: Secondary | ICD-10-CM | POA: Diagnosis not present

## 2023-01-23 DIAGNOSIS — I4891 Unspecified atrial fibrillation: Secondary | ICD-10-CM | POA: Diagnosis not present

## 2023-01-23 DIAGNOSIS — I5032 Chronic diastolic (congestive) heart failure: Secondary | ICD-10-CM | POA: Diagnosis not present

## 2023-01-24 DIAGNOSIS — I4891 Unspecified atrial fibrillation: Secondary | ICD-10-CM | POA: Diagnosis not present

## 2023-01-24 DIAGNOSIS — R531 Weakness: Secondary | ICD-10-CM | POA: Diagnosis not present

## 2023-01-24 DIAGNOSIS — F0390 Unspecified dementia without behavioral disturbance: Secondary | ICD-10-CM | POA: Diagnosis not present

## 2023-01-25 DIAGNOSIS — F0390 Unspecified dementia without behavioral disturbance: Secondary | ICD-10-CM | POA: Diagnosis not present

## 2023-01-25 DIAGNOSIS — F339 Major depressive disorder, recurrent, unspecified: Secondary | ICD-10-CM | POA: Diagnosis not present

## 2023-01-25 DIAGNOSIS — F4321 Adjustment disorder with depressed mood: Secondary | ICD-10-CM | POA: Diagnosis not present

## 2023-01-26 DIAGNOSIS — D649 Anemia, unspecified: Secondary | ICD-10-CM | POA: Diagnosis not present

## 2023-01-26 DIAGNOSIS — E119 Type 2 diabetes mellitus without complications: Secondary | ICD-10-CM | POA: Diagnosis not present

## 2023-01-26 DIAGNOSIS — E876 Hypokalemia: Secondary | ICD-10-CM | POA: Diagnosis not present

## 2023-02-07 DIAGNOSIS — I110 Hypertensive heart disease with heart failure: Secondary | ICD-10-CM | POA: Diagnosis not present

## 2023-02-07 DIAGNOSIS — E119 Type 2 diabetes mellitus without complications: Secondary | ICD-10-CM | POA: Diagnosis not present

## 2023-02-10 DIAGNOSIS — F0390 Unspecified dementia without behavioral disturbance: Secondary | ICD-10-CM | POA: Diagnosis not present

## 2023-02-10 DIAGNOSIS — R296 Repeated falls: Secondary | ICD-10-CM | POA: Diagnosis not present

## 2023-02-10 DIAGNOSIS — I5032 Chronic diastolic (congestive) heart failure: Secondary | ICD-10-CM | POA: Diagnosis not present

## 2023-02-27 NOTE — Progress Notes (Signed)
Remote pacemaker transmission.   

## 2023-03-03 ENCOUNTER — Other Ambulatory Visit: Payer: Self-pay | Admitting: *Deleted

## 2023-03-03 DIAGNOSIS — E119 Type 2 diabetes mellitus without complications: Secondary | ICD-10-CM | POA: Diagnosis not present

## 2023-03-03 DIAGNOSIS — F339 Major depressive disorder, recurrent, unspecified: Secondary | ICD-10-CM | POA: Diagnosis not present

## 2023-03-03 DIAGNOSIS — I5030 Unspecified diastolic (congestive) heart failure: Secondary | ICD-10-CM | POA: Diagnosis not present

## 2023-03-03 NOTE — Patient Outreach (Signed)
[name] resides in [place]. Screening for potential [facility] care coordination services as benefit of health plan and Primary Care Provider.  Verified with [facility] Ad[facility] Director. [name] has transitioned to long term care. No identifiable [facility] care coordination needs.   Marthenia Rolling, MSN, RN,BSN [facility] Acute Care Coordinator [phone] (Direct dial)

## 2023-03-06 DIAGNOSIS — E119 Type 2 diabetes mellitus without complications: Secondary | ICD-10-CM | POA: Diagnosis not present

## 2023-03-06 DIAGNOSIS — F0390 Unspecified dementia without behavioral disturbance: Secondary | ICD-10-CM | POA: Diagnosis not present

## 2023-03-06 DIAGNOSIS — I5032 Chronic diastolic (congestive) heart failure: Secondary | ICD-10-CM | POA: Diagnosis not present

## 2023-03-06 DIAGNOSIS — I4891 Unspecified atrial fibrillation: Secondary | ICD-10-CM | POA: Diagnosis not present

## 2023-03-06 DIAGNOSIS — I110 Hypertensive heart disease with heart failure: Secondary | ICD-10-CM | POA: Diagnosis not present

## 2023-03-09 DIAGNOSIS — F4321 Adjustment disorder with depressed mood: Secondary | ICD-10-CM | POA: Diagnosis not present

## 2023-03-09 DIAGNOSIS — F0390 Unspecified dementia without behavioral disturbance: Secondary | ICD-10-CM | POA: Diagnosis not present

## 2023-03-09 DIAGNOSIS — F339 Major depressive disorder, recurrent, unspecified: Secondary | ICD-10-CM | POA: Diagnosis not present

## 2023-03-16 DIAGNOSIS — E1159 Type 2 diabetes mellitus with other circulatory complications: Secondary | ICD-10-CM | POA: Diagnosis not present

## 2023-03-16 DIAGNOSIS — B351 Tinea unguium: Secondary | ICD-10-CM | POA: Diagnosis not present

## 2023-03-21 DIAGNOSIS — I4891 Unspecified atrial fibrillation: Secondary | ICD-10-CM | POA: Diagnosis not present

## 2023-03-21 DIAGNOSIS — M1990 Unspecified osteoarthritis, unspecified site: Secondary | ICD-10-CM | POA: Diagnosis not present

## 2023-03-21 DIAGNOSIS — I5032 Chronic diastolic (congestive) heart failure: Secondary | ICD-10-CM | POA: Diagnosis not present

## 2023-03-22 ENCOUNTER — Encounter (INDEPENDENT_AMBULATORY_CARE_PROVIDER_SITE_OTHER): Payer: Medicare Other | Admitting: Ophthalmology

## 2023-03-22 DIAGNOSIS — I10 Essential (primary) hypertension: Secondary | ICD-10-CM

## 2023-03-22 DIAGNOSIS — H353132 Nonexudative age-related macular degeneration, bilateral, intermediate dry stage: Secondary | ICD-10-CM

## 2023-03-22 DIAGNOSIS — H35033 Hypertensive retinopathy, bilateral: Secondary | ICD-10-CM

## 2023-03-22 DIAGNOSIS — H43813 Vitreous degeneration, bilateral: Secondary | ICD-10-CM | POA: Diagnosis not present

## 2023-03-22 DIAGNOSIS — H34832 Tributary (branch) retinal vein occlusion, left eye, with macular edema: Secondary | ICD-10-CM

## 2023-03-23 DIAGNOSIS — I110 Hypertensive heart disease with heart failure: Secondary | ICD-10-CM | POA: Diagnosis not present

## 2023-03-23 DIAGNOSIS — H348192 Central retinal vein occlusion, unspecified eye, stable: Secondary | ICD-10-CM | POA: Diagnosis not present

## 2023-03-29 DIAGNOSIS — I110 Hypertensive heart disease with heart failure: Secondary | ICD-10-CM | POA: Diagnosis not present

## 2023-03-29 DIAGNOSIS — I4891 Unspecified atrial fibrillation: Secondary | ICD-10-CM | POA: Diagnosis not present

## 2023-03-30 DIAGNOSIS — F4321 Adjustment disorder with depressed mood: Secondary | ICD-10-CM | POA: Diagnosis not present

## 2023-03-30 DIAGNOSIS — F0390 Unspecified dementia without behavioral disturbance: Secondary | ICD-10-CM | POA: Diagnosis not present

## 2023-03-30 DIAGNOSIS — F339 Major depressive disorder, recurrent, unspecified: Secondary | ICD-10-CM | POA: Diagnosis not present

## 2023-04-01 DIAGNOSIS — F339 Major depressive disorder, recurrent, unspecified: Secondary | ICD-10-CM | POA: Diagnosis not present

## 2023-04-12 ENCOUNTER — Telehealth: Payer: Self-pay

## 2023-04-12 NOTE — Telephone Encounter (Signed)
The patient wife let me know that he is in a nursing home and has dementia. I told her the monitor needs to be with him. I also let her know if he do not have a phone line at the nursing home to hook up the monitor, I can order him a cell adapter for the monitor to work.

## 2023-04-20 DIAGNOSIS — F4321 Adjustment disorder with depressed mood: Secondary | ICD-10-CM | POA: Diagnosis not present

## 2023-04-20 DIAGNOSIS — F0390 Unspecified dementia without behavioral disturbance: Secondary | ICD-10-CM | POA: Diagnosis not present

## 2023-04-20 DIAGNOSIS — F339 Major depressive disorder, recurrent, unspecified: Secondary | ICD-10-CM | POA: Diagnosis not present

## 2023-04-27 DIAGNOSIS — F4321 Adjustment disorder with depressed mood: Secondary | ICD-10-CM | POA: Diagnosis not present

## 2023-04-27 DIAGNOSIS — F339 Major depressive disorder, recurrent, unspecified: Secondary | ICD-10-CM | POA: Diagnosis not present

## 2023-04-27 DIAGNOSIS — G4700 Insomnia, unspecified: Secondary | ICD-10-CM | POA: Diagnosis not present

## 2023-04-27 DIAGNOSIS — F0390 Unspecified dementia without behavioral disturbance: Secondary | ICD-10-CM | POA: Diagnosis not present

## 2023-05-02 DIAGNOSIS — M6281 Muscle weakness (generalized): Secondary | ICD-10-CM | POA: Diagnosis not present

## 2023-05-02 DIAGNOSIS — R279 Unspecified lack of coordination: Secondary | ICD-10-CM | POA: Diagnosis not present

## 2023-05-02 DIAGNOSIS — R41841 Cognitive communication deficit: Secondary | ICD-10-CM | POA: Diagnosis not present

## 2023-05-02 DIAGNOSIS — R1312 Dysphagia, oropharyngeal phase: Secondary | ICD-10-CM | POA: Diagnosis not present

## 2023-05-03 DIAGNOSIS — M6281 Muscle weakness (generalized): Secondary | ICD-10-CM | POA: Diagnosis not present

## 2023-05-03 DIAGNOSIS — R279 Unspecified lack of coordination: Secondary | ICD-10-CM | POA: Diagnosis not present

## 2023-05-03 DIAGNOSIS — R41841 Cognitive communication deficit: Secondary | ICD-10-CM | POA: Diagnosis not present

## 2023-05-03 DIAGNOSIS — R1312 Dysphagia, oropharyngeal phase: Secondary | ICD-10-CM | POA: Diagnosis not present

## 2023-05-04 ENCOUNTER — Ambulatory Visit: Payer: Medicare Other | Attending: Cardiology | Admitting: Cardiology

## 2023-05-04 ENCOUNTER — Encounter: Payer: Self-pay | Admitting: Cardiology

## 2023-05-04 VITALS — BP 124/78 | HR 61 | Ht 72.0 in | Wt 152.0 lb

## 2023-05-04 DIAGNOSIS — Z950 Presence of cardiac pacemaker: Secondary | ICD-10-CM | POA: Diagnosis not present

## 2023-05-04 DIAGNOSIS — G4700 Insomnia, unspecified: Secondary | ICD-10-CM | POA: Diagnosis not present

## 2023-05-04 DIAGNOSIS — I2510 Atherosclerotic heart disease of native coronary artery without angina pectoris: Secondary | ICD-10-CM

## 2023-05-04 DIAGNOSIS — I4891 Unspecified atrial fibrillation: Secondary | ICD-10-CM | POA: Diagnosis not present

## 2023-05-04 DIAGNOSIS — R41841 Cognitive communication deficit: Secondary | ICD-10-CM | POA: Diagnosis not present

## 2023-05-04 DIAGNOSIS — R279 Unspecified lack of coordination: Secondary | ICD-10-CM | POA: Diagnosis not present

## 2023-05-04 DIAGNOSIS — I5032 Chronic diastolic (congestive) heart failure: Secondary | ICD-10-CM | POA: Diagnosis not present

## 2023-05-04 DIAGNOSIS — F339 Major depressive disorder, recurrent, unspecified: Secondary | ICD-10-CM | POA: Diagnosis not present

## 2023-05-04 DIAGNOSIS — R1312 Dysphagia, oropharyngeal phase: Secondary | ICD-10-CM | POA: Diagnosis not present

## 2023-05-04 DIAGNOSIS — F0390 Unspecified dementia without behavioral disturbance: Secondary | ICD-10-CM | POA: Diagnosis not present

## 2023-05-04 DIAGNOSIS — F4321 Adjustment disorder with depressed mood: Secondary | ICD-10-CM | POA: Diagnosis not present

## 2023-05-04 DIAGNOSIS — M6281 Muscle weakness (generalized): Secondary | ICD-10-CM | POA: Diagnosis not present

## 2023-05-04 NOTE — Progress Notes (Signed)
Clinical Summary Mr. Leidig is a 89 y.o.male seen today for follow up of the following medical problems.    1. Mild systolic dysfunction   - echo 12/6107 w/ LVEF 40-45%   - Cath 08/2013 w/ minimal CAD, LV gram 60% with normal filling pressures on RHC.     2018 echo LVEF 55-60%.     - We have avoided aggressive diuretic dosing due to chronic orthostatic dizziness.  Jan 2024 echo: LVEF 55-60%, no WMAs, indet diastolic fxn - no significant SOB, swelling improved since last visit   2. HTN    - accepting higher bp's due to prior dizziness  - previously stopped norvasc to see if would help with LE edema   -toprol started by EP, increased by pcp to 50mg  daily.        3. Bradycardia   -s/p St Jude dual chamber pacemaker   - followed by EP -normal device check Jan 2024     4. Afib - no palpitations.  - no bleeding on eliquis   5. CAD   - patent major arteries with evidence of disease in diags and OMs from prior cath   - no specific caridac chest pains.    6. Dementia - in nursing home since Jan 2024. Recurrent falls at homes, progressing dementia. - falls have resolved.    SH: wife on chemo and radiation along with recent hysterectomy, endometrial cancer. Just completed treatments, told she is cancer free Past Medical History:  Diagnosis Date   Allergic rhinitis    Anemia    Anxiety    Arthritis    "all over my body" (09/10/2013)   BPH (benign prostatic hypertrophy)    CAD (coronary artery disease)    a. 08/2013 LM nl, LAD 59m, D1 80ost, 95p, LCX 62m, RCA 20d, EF 60%.   Chronic lower back pain    Depression    Diabetes mellitus    "diet controlled" (09/10/2013)   Diaphragmatic hernia without mention of obstruction or gangrene    Diastolic congestive heart failure (HCC)    Elevated liver function tests    Essential hypertension, benign    Lacunar infarction (HCC)    a. 08/2013 post-cath, MRI: sm acute lacunar infarcts in LPCA and LSCA, ? tiny RPICA lacunar infarct.    Obesity    OSA on CPAP    "wears mask part of the time" (09/10/2013)   Osteoporosis    Sinus node dysfunction (HCC)    a. 08/2013 s/p SJM DC PPM.     Allergies  Allergen Reactions   Fentanyl Other (See Comments)    resp suppression per wife, out for 2 days      Current Outpatient Medications  Medication Sig Dispense Refill   acetaminophen (TYLENOL) 500 MG tablet Take 500 mg by mouth every 6 (six) hours as needed for pain.     alendronate (FOSAMAX) 70 MG tablet Take 70 mg by mouth every 7 (seven) days. Take with a full glass of water on an empty stomach. On Mondays     apixaban (ELIQUIS) 5 MG TABS tablet Take 1 tablet (5 mg total) by mouth 2 (two) times daily. 180 tablet 1   Calcium Carbonate-Vitamin D (CALCIUM 600 + D PO) Take 600 mg by mouth 2 (two) times daily.     donepezil (ARICEPT) 5 MG tablet Take 10 mg by mouth daily.     dorzolamide-timolol (COSOPT) 22.3-6.8 MG/ML ophthalmic solution USE ONE DROP IN LEFT EYE TWICE DAILY  enalapril (VASOTEC) 20 MG tablet Take 20 mg by mouth 2 (two) times daily.     escitalopram (LEXAPRO) 5 MG tablet Take 10 mg by mouth daily.     fish oil-omega-3 fatty acids 1000 MG capsule Take 1 g by mouth 2 (two) times daily.     furosemide (LASIX) 40 MG tablet Take 1.5 tablets (60 mg total) by mouth daily. 135 tablet 3   isosorbide mononitrate (IMDUR) 30 MG 24 hr tablet Take 30 mg by mouth daily.     memantine (NAMENDA) 5 MG tablet Take 5 mg by mouth 2 (two) times daily.     metFORMIN (GLUCOPHAGE) 500 MG tablet Take 500 mg by mouth daily with breakfast.     metoprolol succinate (TOPROL-XL) 50 MG 24 hr tablet Take 50 mg by mouth daily.     Multiple Vitamin (MULTIVITAMIN WITH MINERALS) TABS tablet Take 1 tablet by mouth daily.     nitroGLYCERIN (NITROSTAT) 0.4 MG SL tablet Place 1 tablet (0.4 mg total) under the tongue every 5 (five) minutes x 3 doses as needed for chest pain. 25 tablet 3   potassium chloride SA (KLOR-CON M) 20 MEQ tablet Take 2 tablets  (40 mEq total) by mouth daily. 180 tablet 3   tamsulosin (FLOMAX) 0.4 MG CAPS capsule Take 0.4 mg by mouth daily.      traMADol (ULTRAM) 50 MG tablet Take 50 mg by mouth every 6 (six) hours as needed.      No current facility-administered medications for this visit.     Past Surgical History:  Procedure Laterality Date   CARDIAC CATHETERIZATION  1998   CATARACT EXTRACTION W/ INTRAOCULAR LENS  IMPLANT, BILATERAL  2010   LEFT AND RIGHT HEART CATHETERIZATION WITH CORONARY ANGIOGRAM N/A 09/11/2013   Procedure: LEFT AND RIGHT HEART CATHETERIZATION WITH CORONARY ANGIOGRAM;  Surgeon: Iran Ouch, MD;  Location: [facility] CATH LAB;  Service: Cardiovascular;  Laterality: N/A;   PERMANENT PACEMAKER INSERTION N/A 09/13/2013   Procedure: PERMANENT PACEMAKER INSERTION;  Surgeon: Marinus Maw, MD;  Location: [facility] CATH LAB;  Service: Cardiovascular;  Laterality: N/A;   right hip fx  05/26/2016   right wrist fx  05/26/2016   STOMACH SURGERY  03/02/2012   ?volvus; "stomach came loose and twisted; went up under rib cage; had that corrected; tacked it up" (09/10/2013)     Allergies  Allergen Reactions   Fentanyl Other (See Comments)    resp suppression per wife, out for 2 days       Family History  Problem Relation Age of Onset   Hypertension Father    Hypertension Mother      Social History Mr. Zuhlke reports that he has never smoked. He has never used smokeless tobacco. Mr. Slice reports no history of alcohol use.   Review of Systems CONSTITUTIONAL: No weight loss, fever, chills, weakness or fatigue.  HEENT: Eyes: No visual loss, blurred vision, double vision or yellow sclerae.No hearing loss, sneezing, congestion, runny nose or sore throat.  SKIN: No rash or itching.  CARDIOVASCULAR: per hpi RESPIRATORY: No shortness of breath, cough or sputum.  GASTROINTESTINAL: No anorexia, nausea, vomiting or diarrhea. No abdominal pain or blood.  GENITOURINARY: No burning on urination, no  polyuria NEUROLOGICAL: No headache, dizziness, syncope, paralysis, ataxia, numbness or tingling in the extremities. No change in bowel or bladder control.  MUSCULOSKELETAL: No muscle, back pain, joint pain or stiffness.  LYMPHATICS: No enlarged nodes. No history of splenectomy.  PSYCHIATRIC: No history of depression or anxiety.  ENDOCRINOLOGIC: No reports of sweating, cold or heat intolerance. No polyuria or polydipsia.  Marland Kitchen   Physical Examination Today's Vitals   05/04/23 0957  BP: 124/78  Pulse: 61  SpO2: 96%  Weight: 152 lb (68.9 kg)  Height: 6' (1.829 m)   Body mass index is 20.61 kg/m.  Gen: resting comfortably, no acute distress HEENT: no scleral icterus, pupils equal round and reactive, no palptable cervical adenopathy,  CV: RRR, no m/rg, no jvd Resp: Clear to auscultation bilaterally GI: abdomen is soft, non-tender, non-distended, normal bowel sounds, no hepatosplenomegaly MSK: extremities are warm, no edema.  Skin: warm, no rash Neuro:  no focal deficits Psych: appropriate affect   Diagnostic Studies  Cardiac Catheterization 09-14-13   Procedural Findings:   Hemodynamics   RA 3 mmHg   RV 37/1 mmHg   PA 33/12 mmHg   PCWP 6 mmHg   LV 201/4 mHg . LVEDP: 12 mmHg   AO 202/94 mmHg   Oxygen saturations:   PA 74%   AO 94%   Cardiac Output (Fick) 6.72   Cardiac Index (Fick) 3.22   Pulmonary vascular resistance (PVR): 1.9 Woods units.   Coronary angiography:   Coronary dominance: right   Left Main: Normal in size with no significant disease.   Left Anterior Descending (LAD): Large in size with mild diffuse atherosclerosis. There is diffuse 20% disease in the midsegment.   1st diagonal (D1): Normal in size with 80% ostial stenosis followed by a 95% proximal stenosis.   2nd diagonal (D2): Medium in size with minor irregularities.   3rd diagonal (D3): Normal in size with no significant disease.   Circumflex (LCx): Normal in size and nondominant. There is 20% mid  stenosis.   1st obtuse marginal: Medium in size with minor irregularities.   2nd obtuse marginal: Large is in size with no significant disease.   3rd obtuse marginal: Normal in size with minor irregularities.   Right Coronary Artery: large in size and dominant. There is 20% stenosis distally.   Posterior descending artery: normal in size with no significant disease.   Posterior AV segment: normal in size with no significant disease.   Posterolateral branchs: PL 1 is large with no significant disease. PL 2 is small. Left ventriculography: Left ventricular systolic function is normal , LVEF is estimated at 60 %, there is no significant mitral regurgitation     07/2012 Echo: mild LVH, LVEF 40-45%, LAE, mild MR,    Jan 2024 echo 1. Left ventricular ejection fraction, by estimation, is 55 to 60%. The  left ventricle has normal function. The left ventricle has no regional  wall motion abnormalities. There is mild left ventricular hypertrophy.  Left ventricular diastolic parameters  are indeterminate.   2. Right ventricular systolic function is normal. The right ventricular  size is normal.   3. Left atrial size was severely dilated.   4. Right atrial size was mildly dilated.   5. Mild mitral valve regurgitation.   6. Tricuspid valve regurgitation is mild to moderate.   7. The aortic valve is tricuspid. Aortic valve regurgitation is mild.  Aortic valve sclerosis/calcification is present, without any evidence of  aortic stenosis.     Assessment and Plan   1. History of cardiomyopathy/Chronic diastolic heart failiure - LVEF has since normalized, still with some diastolic dysfunction -. Due to chronic orthostatic symptoms have been cautious with diuretics - no symptoms, leg swelling much improved since last visit - continue lasix   2. CAD   -  patent major arteries with evidence of disease in diags and OMs from prior cath -no significant symptoms, continue to monitor   3. HTN   -  accepting high bp's for him at this time due to prior dizziness - he is at goal, continue current meds   4. Bradycardia/Pacemaker - followed by EP - normal device check Jan 2024, repeat in July - EKG today is vpaced     5. Afib/acquired thrombophilia - doing well without symptoms, continue meds including eliquis for stroke prevention    Antoine Poche, M.D.

## 2023-05-04 NOTE — Patient Instructions (Signed)
Medication Instructions:  Your physician recommends that you continue on your current medications as directed. Please refer to the Current Medication list given to you today.  *If you need a refill on your cardiac medications before your next appointment, please call your pharmacy*   Lab Work: None If you have labs (blood work) drawn today and your tests are completely normal, you will receive your results only by: MyChart Message (if you have MyChart) OR A paper copy in the mail If you have any lab test that is abnormal or we need to change your treatment, we will call you to review the results.   Testing/Procedures: None   Follow-Up: At [facility], you and your health needs are our priority.  As part of our continuing mission to provide you with exceptional heart care, we have created designated Provider Care Teams.  These Care Teams include your primary Cardiologist (physician) and Advanced Practice Providers (APPs -  Physician Assistants and Nurse Practitioners) who all work together to provide you with the care you need, when you need it.  We recommend signing up for the patient portal called "MyChart".  Sign up information is provided on this After Visit Summary.  MyChart is used to connect with patients for Virtual Visits (Telemedicine).  Patients are able to view lab/test results, encounter notes, upcoming appointments, etc.  Non-urgent messages can be sent to your provider as well.   To learn more about what you can do with MyChart, go to [email or web address].    Your next appointment:   6 month(s)  Provider:   Jonathan Branch, MD    Other Instructions    

## 2023-05-05 ENCOUNTER — Telehealth: Payer: Self-pay | Admitting: Cardiology

## 2023-05-05 DIAGNOSIS — R279 Unspecified lack of coordination: Secondary | ICD-10-CM | POA: Diagnosis not present

## 2023-05-05 DIAGNOSIS — R41841 Cognitive communication deficit: Secondary | ICD-10-CM | POA: Diagnosis not present

## 2023-05-05 DIAGNOSIS — M6281 Muscle weakness (generalized): Secondary | ICD-10-CM | POA: Diagnosis not present

## 2023-05-05 DIAGNOSIS — R1312 Dysphagia, oropharyngeal phase: Secondary | ICD-10-CM | POA: Diagnosis not present

## 2023-05-05 NOTE — Telephone Encounter (Signed)
Patient's spouse is returning phone call.

## 2023-05-05 NOTE — Telephone Encounter (Signed)
I called the patient and [employer] tech support to order a cell adapter for the patient. She should receive it in 10-14 business days.

## 2023-05-08 DIAGNOSIS — R41841 Cognitive communication deficit: Secondary | ICD-10-CM | POA: Diagnosis not present

## 2023-05-08 DIAGNOSIS — M6281 Muscle weakness (generalized): Secondary | ICD-10-CM | POA: Diagnosis not present

## 2023-05-08 DIAGNOSIS — R1312 Dysphagia, oropharyngeal phase: Secondary | ICD-10-CM | POA: Diagnosis not present

## 2023-05-08 DIAGNOSIS — R279 Unspecified lack of coordination: Secondary | ICD-10-CM | POA: Diagnosis not present

## 2023-05-09 DIAGNOSIS — R1312 Dysphagia, oropharyngeal phase: Secondary | ICD-10-CM | POA: Diagnosis not present

## 2023-05-09 DIAGNOSIS — R279 Unspecified lack of coordination: Secondary | ICD-10-CM | POA: Diagnosis not present

## 2023-05-09 DIAGNOSIS — R41841 Cognitive communication deficit: Secondary | ICD-10-CM | POA: Diagnosis not present

## 2023-05-09 DIAGNOSIS — M6281 Muscle weakness (generalized): Secondary | ICD-10-CM | POA: Diagnosis not present

## 2023-05-10 DIAGNOSIS — M6281 Muscle weakness (generalized): Secondary | ICD-10-CM | POA: Diagnosis not present

## 2023-05-10 DIAGNOSIS — R41841 Cognitive communication deficit: Secondary | ICD-10-CM | POA: Diagnosis not present

## 2023-05-10 DIAGNOSIS — R1312 Dysphagia, oropharyngeal phase: Secondary | ICD-10-CM | POA: Diagnosis not present

## 2023-05-10 DIAGNOSIS — R279 Unspecified lack of coordination: Secondary | ICD-10-CM | POA: Diagnosis not present

## 2023-05-11 DIAGNOSIS — E559 Vitamin D deficiency, unspecified: Secondary | ICD-10-CM | POA: Diagnosis not present

## 2023-05-11 DIAGNOSIS — R279 Unspecified lack of coordination: Secondary | ICD-10-CM | POA: Diagnosis not present

## 2023-05-11 DIAGNOSIS — F4321 Adjustment disorder with depressed mood: Secondary | ICD-10-CM | POA: Diagnosis not present

## 2023-05-11 DIAGNOSIS — E119 Type 2 diabetes mellitus without complications: Secondary | ICD-10-CM | POA: Diagnosis not present

## 2023-05-11 DIAGNOSIS — F339 Major depressive disorder, recurrent, unspecified: Secondary | ICD-10-CM | POA: Diagnosis not present

## 2023-05-11 DIAGNOSIS — R41841 Cognitive communication deficit: Secondary | ICD-10-CM | POA: Diagnosis not present

## 2023-05-11 DIAGNOSIS — R1312 Dysphagia, oropharyngeal phase: Secondary | ICD-10-CM | POA: Diagnosis not present

## 2023-05-11 DIAGNOSIS — G4700 Insomnia, unspecified: Secondary | ICD-10-CM | POA: Diagnosis not present

## 2023-05-11 DIAGNOSIS — M6281 Muscle weakness (generalized): Secondary | ICD-10-CM | POA: Diagnosis not present

## 2023-05-11 DIAGNOSIS — F0390 Unspecified dementia without behavioral disturbance: Secondary | ICD-10-CM | POA: Diagnosis not present

## 2023-05-12 DIAGNOSIS — R0989 Other specified symptoms and signs involving the circulatory and respiratory systems: Secondary | ICD-10-CM | POA: Diagnosis not present

## 2023-05-12 DIAGNOSIS — R41841 Cognitive communication deficit: Secondary | ICD-10-CM | POA: Diagnosis not present

## 2023-05-12 DIAGNOSIS — M6281 Muscle weakness (generalized): Secondary | ICD-10-CM | POA: Diagnosis not present

## 2023-05-12 DIAGNOSIS — R279 Unspecified lack of coordination: Secondary | ICD-10-CM | POA: Diagnosis not present

## 2023-05-12 DIAGNOSIS — R1312 Dysphagia, oropharyngeal phase: Secondary | ICD-10-CM | POA: Diagnosis not present

## 2023-05-14 DIAGNOSIS — Z66 Do not resuscitate: Secondary | ICD-10-CM | POA: Diagnosis not present

## 2023-05-14 DIAGNOSIS — R0602 Shortness of breath: Secondary | ICD-10-CM | POA: Diagnosis not present

## 2023-05-14 DIAGNOSIS — F4321 Adjustment disorder with depressed mood: Secondary | ICD-10-CM | POA: Diagnosis not present

## 2023-05-14 DIAGNOSIS — H409 Unspecified glaucoma: Secondary | ICD-10-CM | POA: Diagnosis not present

## 2023-05-14 DIAGNOSIS — R55 Syncope and collapse: Secondary | ICD-10-CM | POA: Diagnosis not present

## 2023-05-14 DIAGNOSIS — R1312 Dysphagia, oropharyngeal phase: Secondary | ICD-10-CM | POA: Diagnosis not present

## 2023-05-14 DIAGNOSIS — I482 Chronic atrial fibrillation, unspecified: Secondary | ICD-10-CM | POA: Diagnosis not present

## 2023-05-14 DIAGNOSIS — Z792 Long term (current) use of antibiotics: Secondary | ICD-10-CM | POA: Diagnosis not present

## 2023-05-14 DIAGNOSIS — Z1152 Encounter for screening for COVID-19: Secondary | ICD-10-CM | POA: Diagnosis not present

## 2023-05-14 DIAGNOSIS — J189 Pneumonia, unspecified organism: Secondary | ICD-10-CM | POA: Diagnosis not present

## 2023-05-14 DIAGNOSIS — G4730 Sleep apnea, unspecified: Secondary | ICD-10-CM | POA: Diagnosis present

## 2023-05-14 DIAGNOSIS — I6381 Other cerebral infarction due to occlusion or stenosis of small artery: Secondary | ICD-10-CM | POA: Diagnosis not present

## 2023-05-14 DIAGNOSIS — Z7983 Long term (current) use of bisphosphonates: Secondary | ICD-10-CM | POA: Diagnosis not present

## 2023-05-14 DIAGNOSIS — Z950 Presence of cardiac pacemaker: Secondary | ICD-10-CM | POA: Diagnosis not present

## 2023-05-14 DIAGNOSIS — F339 Major depressive disorder, recurrent, unspecified: Secondary | ICD-10-CM | POA: Diagnosis not present

## 2023-05-14 DIAGNOSIS — R61 Generalized hyperhidrosis: Secondary | ICD-10-CM | POA: Diagnosis not present

## 2023-05-14 DIAGNOSIS — R4182 Altered mental status, unspecified: Secondary | ICD-10-CM | POA: Diagnosis not present

## 2023-05-14 DIAGNOSIS — E870 Hyperosmolality and hypernatremia: Secondary | ICD-10-CM | POA: Diagnosis not present

## 2023-05-14 DIAGNOSIS — Z7984 Long term (current) use of oral hypoglycemic drugs: Secondary | ICD-10-CM | POA: Diagnosis not present

## 2023-05-14 DIAGNOSIS — I4891 Unspecified atrial fibrillation: Secondary | ICD-10-CM | POA: Diagnosis not present

## 2023-05-14 DIAGNOSIS — Z20822 Contact with and (suspected) exposure to covid-19: Secondary | ICD-10-CM | POA: Diagnosis not present

## 2023-05-14 DIAGNOSIS — G4700 Insomnia, unspecified: Secondary | ICD-10-CM | POA: Diagnosis not present

## 2023-05-14 DIAGNOSIS — J90 Pleural effusion, not elsewhere classified: Secondary | ICD-10-CM | POA: Diagnosis not present

## 2023-05-14 DIAGNOSIS — I5030 Unspecified diastolic (congestive) heart failure: Secondary | ICD-10-CM | POA: Diagnosis not present

## 2023-05-14 DIAGNOSIS — M179 Osteoarthritis of knee, unspecified: Secondary | ICD-10-CM | POA: Diagnosis not present

## 2023-05-14 DIAGNOSIS — I4821 Permanent atrial fibrillation: Secondary | ICD-10-CM | POA: Diagnosis not present

## 2023-05-14 DIAGNOSIS — R6520 Severe sepsis without septic shock: Secondary | ICD-10-CM | POA: Diagnosis not present

## 2023-05-14 DIAGNOSIS — M47816 Spondylosis without myelopathy or radiculopathy, lumbar region: Secondary | ICD-10-CM | POA: Diagnosis not present

## 2023-05-14 DIAGNOSIS — I10 Essential (primary) hypertension: Secondary | ICD-10-CM | POA: Diagnosis present

## 2023-05-14 DIAGNOSIS — M810 Age-related osteoporosis without current pathological fracture: Secondary | ICD-10-CM | POA: Diagnosis not present

## 2023-05-14 DIAGNOSIS — J159 Unspecified bacterial pneumonia: Secondary | ICD-10-CM | POA: Diagnosis not present

## 2023-05-14 DIAGNOSIS — J80 Acute respiratory distress syndrome: Secondary | ICD-10-CM | POA: Diagnosis not present

## 2023-05-14 DIAGNOSIS — F0390 Unspecified dementia without behavioral disturbance: Secondary | ICD-10-CM | POA: Diagnosis not present

## 2023-05-14 DIAGNOSIS — Z79899 Other long term (current) drug therapy: Secondary | ICD-10-CM | POA: Diagnosis not present

## 2023-05-14 DIAGNOSIS — R404 Transient alteration of awareness: Secondary | ICD-10-CM | POA: Diagnosis not present

## 2023-05-14 DIAGNOSIS — E876 Hypokalemia: Secondary | ICD-10-CM | POA: Diagnosis not present

## 2023-05-14 DIAGNOSIS — J9601 Acute respiratory failure with hypoxia: Secondary | ICD-10-CM | POA: Diagnosis not present

## 2023-05-14 DIAGNOSIS — R531 Weakness: Secondary | ICD-10-CM | POA: Diagnosis not present

## 2023-05-14 DIAGNOSIS — E440 Moderate protein-calorie malnutrition: Secondary | ICD-10-CM | POA: Diagnosis not present

## 2023-05-14 DIAGNOSIS — I110 Hypertensive heart disease with heart failure: Secondary | ICD-10-CM | POA: Diagnosis not present

## 2023-05-14 DIAGNOSIS — E119 Type 2 diabetes mellitus without complications: Secondary | ICD-10-CM | POA: Diagnosis not present

## 2023-05-14 DIAGNOSIS — Z7401 Bed confinement status: Secondary | ICD-10-CM | POA: Diagnosis not present

## 2023-05-14 DIAGNOSIS — Z9981 Dependence on supplemental oxygen: Secondary | ICD-10-CM | POA: Diagnosis not present

## 2023-05-14 DIAGNOSIS — Z8673 Personal history of transient ischemic attack (TIA), and cerebral infarction without residual deficits: Secondary | ICD-10-CM | POA: Diagnosis not present

## 2023-05-14 DIAGNOSIS — R0600 Dyspnea, unspecified: Secondary | ICD-10-CM | POA: Diagnosis not present

## 2023-05-14 DIAGNOSIS — R5381 Other malaise: Secondary | ICD-10-CM | POA: Diagnosis not present

## 2023-05-14 DIAGNOSIS — R0902 Hypoxemia: Secondary | ICD-10-CM | POA: Diagnosis not present

## 2023-05-14 DIAGNOSIS — R41841 Cognitive communication deficit: Secondary | ICD-10-CM | POA: Diagnosis not present

## 2023-05-14 DIAGNOSIS — A419 Sepsis, unspecified organism: Secondary | ICD-10-CM | POA: Diagnosis not present

## 2023-05-14 DIAGNOSIS — Z7901 Long term (current) use of anticoagulants: Secondary | ICD-10-CM | POA: Diagnosis not present

## 2023-05-14 DIAGNOSIS — Z9989 Dependence on other enabling machines and devices: Secondary | ICD-10-CM | POA: Diagnosis not present

## 2023-05-14 DIAGNOSIS — R296 Repeated falls: Secondary | ICD-10-CM | POA: Diagnosis not present

## 2023-05-14 DIAGNOSIS — M6281 Muscle weakness (generalized): Secondary | ICD-10-CM | POA: Diagnosis not present

## 2023-05-18 DIAGNOSIS — I5033 Acute on chronic diastolic (congestive) heart failure: Secondary | ICD-10-CM | POA: Diagnosis not present

## 2023-05-18 DIAGNOSIS — M47816 Spondylosis without myelopathy or radiculopathy, lumbar region: Secondary | ICD-10-CM | POA: Diagnosis not present

## 2023-05-18 DIAGNOSIS — I4891 Unspecified atrial fibrillation: Secondary | ICD-10-CM | POA: Diagnosis not present

## 2023-05-18 DIAGNOSIS — R570 Cardiogenic shock: Secondary | ICD-10-CM | POA: Diagnosis not present

## 2023-05-18 DIAGNOSIS — I10 Essential (primary) hypertension: Secondary | ICD-10-CM | POA: Diagnosis not present

## 2023-05-18 DIAGNOSIS — R54 Age-related physical debility: Secondary | ICD-10-CM | POA: Diagnosis not present

## 2023-05-18 DIAGNOSIS — I482 Chronic atrial fibrillation, unspecified: Secondary | ICD-10-CM | POA: Diagnosis not present

## 2023-05-18 DIAGNOSIS — J9601 Acute respiratory failure with hypoxia: Secondary | ICD-10-CM | POA: Diagnosis not present

## 2023-05-18 DIAGNOSIS — A419 Sepsis, unspecified organism: Secondary | ICD-10-CM | POA: Diagnosis not present

## 2023-05-18 DIAGNOSIS — T50905A Adverse effect of unspecified drugs, medicaments and biological substances, initial encounter: Secondary | ICD-10-CM | POA: Diagnosis not present

## 2023-05-18 DIAGNOSIS — Z9181 History of falling: Secondary | ICD-10-CM | POA: Diagnosis not present

## 2023-05-18 DIAGNOSIS — F0390 Unspecified dementia without behavioral disturbance: Secondary | ICD-10-CM | POA: Diagnosis not present

## 2023-05-18 DIAGNOSIS — R1312 Dysphagia, oropharyngeal phase: Secondary | ICD-10-CM | POA: Diagnosis not present

## 2023-05-18 DIAGNOSIS — R531 Weakness: Secondary | ICD-10-CM | POA: Diagnosis not present

## 2023-05-18 DIAGNOSIS — E119 Type 2 diabetes mellitus without complications: Secondary | ICD-10-CM | POA: Diagnosis present

## 2023-05-18 DIAGNOSIS — R404 Transient alteration of awareness: Secondary | ICD-10-CM | POA: Diagnosis not present

## 2023-05-18 DIAGNOSIS — J90 Pleural effusion, not elsewhere classified: Secondary | ICD-10-CM | POA: Diagnosis not present

## 2023-05-18 DIAGNOSIS — Z539 Procedure and treatment not carried out, unspecified reason: Secondary | ICD-10-CM | POA: Diagnosis not present

## 2023-05-18 DIAGNOSIS — E43 Unspecified severe protein-calorie malnutrition: Secondary | ICD-10-CM | POA: Diagnosis not present

## 2023-05-18 DIAGNOSIS — R079 Chest pain, unspecified: Secondary | ICD-10-CM | POA: Diagnosis not present

## 2023-05-18 DIAGNOSIS — Z66 Do not resuscitate: Secondary | ICD-10-CM | POA: Diagnosis not present

## 2023-05-18 DIAGNOSIS — F339 Major depressive disorder, recurrent, unspecified: Secondary | ICD-10-CM | POA: Diagnosis not present

## 2023-05-18 DIAGNOSIS — E870 Hyperosmolality and hypernatremia: Secondary | ICD-10-CM | POA: Diagnosis not present

## 2023-05-18 DIAGNOSIS — G9340 Encephalopathy, unspecified: Secondary | ICD-10-CM | POA: Diagnosis not present

## 2023-05-18 DIAGNOSIS — Z8673 Personal history of transient ischemic attack (TIA), and cerebral infarction without residual deficits: Secondary | ICD-10-CM | POA: Diagnosis not present

## 2023-05-18 DIAGNOSIS — H409 Unspecified glaucoma: Secondary | ICD-10-CM | POA: Diagnosis not present

## 2023-05-18 DIAGNOSIS — E440 Moderate protein-calorie malnutrition: Secondary | ICD-10-CM | POA: Diagnosis not present

## 2023-05-18 DIAGNOSIS — I110 Hypertensive heart disease with heart failure: Secondary | ICD-10-CM | POA: Diagnosis not present

## 2023-05-18 DIAGNOSIS — R4182 Altered mental status, unspecified: Secondary | ICD-10-CM | POA: Diagnosis not present

## 2023-05-18 DIAGNOSIS — Z7984 Long term (current) use of oral hypoglycemic drugs: Secondary | ICD-10-CM | POA: Diagnosis not present

## 2023-05-18 DIAGNOSIS — F4321 Adjustment disorder with depressed mood: Secondary | ICD-10-CM | POA: Diagnosis not present

## 2023-05-18 DIAGNOSIS — R41841 Cognitive communication deficit: Secondary | ICD-10-CM | POA: Diagnosis not present

## 2023-05-18 DIAGNOSIS — E876 Hypokalemia: Secondary | ICD-10-CM | POA: Diagnosis not present

## 2023-05-18 DIAGNOSIS — R55 Syncope and collapse: Secondary | ICD-10-CM | POA: Diagnosis not present

## 2023-05-18 DIAGNOSIS — Z7983 Long term (current) use of bisphosphonates: Secondary | ICD-10-CM | POA: Diagnosis not present

## 2023-05-18 DIAGNOSIS — Z7901 Long term (current) use of anticoagulants: Secondary | ICD-10-CM | POA: Diagnosis not present

## 2023-05-18 DIAGNOSIS — Z950 Presence of cardiac pacemaker: Secondary | ICD-10-CM | POA: Diagnosis not present

## 2023-05-18 DIAGNOSIS — J189 Pneumonia, unspecified organism: Secondary | ICD-10-CM | POA: Diagnosis not present

## 2023-05-18 DIAGNOSIS — R0902 Hypoxemia: Secondary | ICD-10-CM | POA: Diagnosis not present

## 2023-05-18 DIAGNOSIS — M6281 Muscle weakness (generalized): Secondary | ICD-10-CM | POA: Diagnosis not present

## 2023-05-18 DIAGNOSIS — R062 Wheezing: Secondary | ICD-10-CM | POA: Diagnosis not present

## 2023-05-18 DIAGNOSIS — Z7401 Bed confinement status: Secondary | ICD-10-CM | POA: Diagnosis not present

## 2023-05-18 DIAGNOSIS — M179 Osteoarthritis of knee, unspecified: Secondary | ICD-10-CM | POA: Diagnosis not present

## 2023-05-18 DIAGNOSIS — I2510 Atherosclerotic heart disease of native coronary artery without angina pectoris: Secondary | ICD-10-CM | POA: Diagnosis not present

## 2023-05-18 DIAGNOSIS — G4700 Insomnia, unspecified: Secondary | ICD-10-CM | POA: Diagnosis not present

## 2023-05-18 DIAGNOSIS — I5030 Unspecified diastolic (congestive) heart failure: Secondary | ICD-10-CM | POA: Diagnosis not present

## 2023-05-18 DIAGNOSIS — R296 Repeated falls: Secondary | ICD-10-CM | POA: Diagnosis not present

## 2023-05-18 DIAGNOSIS — J9811 Atelectasis: Secondary | ICD-10-CM | POA: Diagnosis not present

## 2023-05-18 DIAGNOSIS — R5381 Other malaise: Secondary | ICD-10-CM | POA: Diagnosis not present

## 2023-05-19 DIAGNOSIS — E440 Moderate protein-calorie malnutrition: Secondary | ICD-10-CM | POA: Diagnosis not present

## 2023-05-19 DIAGNOSIS — R279 Unspecified lack of coordination: Secondary | ICD-10-CM | POA: Diagnosis not present

## 2023-05-19 DIAGNOSIS — R4182 Altered mental status, unspecified: Secondary | ICD-10-CM | POA: Diagnosis not present

## 2023-05-19 DIAGNOSIS — Z79899 Other long term (current) drug therapy: Secondary | ICD-10-CM | POA: Diagnosis not present

## 2023-05-19 DIAGNOSIS — R5381 Other malaise: Secondary | ICD-10-CM | POA: Diagnosis not present

## 2023-05-19 DIAGNOSIS — F339 Major depressive disorder, recurrent, unspecified: Secondary | ICD-10-CM | POA: Diagnosis not present

## 2023-05-19 DIAGNOSIS — J9811 Atelectasis: Secondary | ICD-10-CM | POA: Diagnosis not present

## 2023-05-19 DIAGNOSIS — Z7984 Long term (current) use of oral hypoglycemic drugs: Secondary | ICD-10-CM | POA: Diagnosis not present

## 2023-05-19 DIAGNOSIS — R41841 Cognitive communication deficit: Secondary | ICD-10-CM | POA: Diagnosis not present

## 2023-05-19 DIAGNOSIS — R2689 Other abnormalities of gait and mobility: Secondary | ICD-10-CM | POA: Diagnosis not present

## 2023-05-19 DIAGNOSIS — F4321 Adjustment disorder with depressed mood: Secondary | ICD-10-CM | POA: Diagnosis not present

## 2023-05-19 DIAGNOSIS — J189 Pneumonia, unspecified organism: Secondary | ICD-10-CM | POA: Diagnosis not present

## 2023-05-19 DIAGNOSIS — E876 Hypokalemia: Secondary | ICD-10-CM | POA: Diagnosis present

## 2023-05-19 DIAGNOSIS — E119 Type 2 diabetes mellitus without complications: Secondary | ICD-10-CM | POA: Diagnosis present

## 2023-05-19 DIAGNOSIS — R570 Cardiogenic shock: Secondary | ICD-10-CM | POA: Diagnosis not present

## 2023-05-19 DIAGNOSIS — T50905A Adverse effect of unspecified drugs, medicaments and biological substances, initial encounter: Secondary | ICD-10-CM | POA: Diagnosis not present

## 2023-05-19 DIAGNOSIS — E870 Hyperosmolality and hypernatremia: Secondary | ICD-10-CM | POA: Diagnosis not present

## 2023-05-19 DIAGNOSIS — H409 Unspecified glaucoma: Secondary | ICD-10-CM | POA: Diagnosis not present

## 2023-05-19 DIAGNOSIS — Z9181 History of falling: Secondary | ICD-10-CM | POA: Diagnosis not present

## 2023-05-19 DIAGNOSIS — R531 Weakness: Secondary | ICD-10-CM | POA: Diagnosis not present

## 2023-05-19 DIAGNOSIS — I5033 Acute on chronic diastolic (congestive) heart failure: Secondary | ICD-10-CM | POA: Diagnosis not present

## 2023-05-19 DIAGNOSIS — I2510 Atherosclerotic heart disease of native coronary artery without angina pectoris: Secondary | ICD-10-CM | POA: Diagnosis not present

## 2023-05-19 DIAGNOSIS — G4700 Insomnia, unspecified: Secondary | ICD-10-CM | POA: Diagnosis not present

## 2023-05-19 DIAGNOSIS — Z7983 Long term (current) use of bisphosphonates: Secondary | ICD-10-CM | POA: Diagnosis not present

## 2023-05-19 DIAGNOSIS — J9601 Acute respiratory failure with hypoxia: Secondary | ICD-10-CM | POA: Diagnosis not present

## 2023-05-19 DIAGNOSIS — M6281 Muscle weakness (generalized): Secondary | ICD-10-CM | POA: Diagnosis not present

## 2023-05-19 DIAGNOSIS — I5030 Unspecified diastolic (congestive) heart failure: Secondary | ICD-10-CM | POA: Diagnosis not present

## 2023-05-19 DIAGNOSIS — I10 Essential (primary) hypertension: Secondary | ICD-10-CM | POA: Diagnosis not present

## 2023-05-19 DIAGNOSIS — J181 Lobar pneumonia, unspecified organism: Secondary | ICD-10-CM | POA: Diagnosis not present

## 2023-05-19 DIAGNOSIS — R0902 Hypoxemia: Secondary | ICD-10-CM | POA: Diagnosis not present

## 2023-05-19 DIAGNOSIS — F0390 Unspecified dementia without behavioral disturbance: Secondary | ICD-10-CM | POA: Diagnosis not present

## 2023-05-19 DIAGNOSIS — I482 Chronic atrial fibrillation, unspecified: Secondary | ICD-10-CM | POA: Diagnosis present

## 2023-05-19 DIAGNOSIS — R6521 Severe sepsis with septic shock: Secondary | ICD-10-CM | POA: Diagnosis not present

## 2023-05-19 DIAGNOSIS — I5032 Chronic diastolic (congestive) heart failure: Secondary | ICD-10-CM | POA: Diagnosis not present

## 2023-05-19 DIAGNOSIS — I340 Nonrheumatic mitral (valve) insufficiency: Secondary | ICD-10-CM | POA: Diagnosis not present

## 2023-05-19 DIAGNOSIS — R079 Chest pain, unspecified: Secondary | ICD-10-CM | POA: Diagnosis not present

## 2023-05-19 DIAGNOSIS — R404 Transient alteration of awareness: Secondary | ICD-10-CM | POA: Diagnosis not present

## 2023-05-19 DIAGNOSIS — Z66 Do not resuscitate: Secondary | ICD-10-CM | POA: Diagnosis not present

## 2023-05-19 DIAGNOSIS — R1312 Dysphagia, oropharyngeal phase: Secondary | ICD-10-CM | POA: Diagnosis not present

## 2023-05-19 DIAGNOSIS — R296 Repeated falls: Secondary | ICD-10-CM | POA: Diagnosis not present

## 2023-05-19 DIAGNOSIS — M47816 Spondylosis without myelopathy or radiculopathy, lumbar region: Secondary | ICD-10-CM | POA: Diagnosis not present

## 2023-05-19 DIAGNOSIS — Z950 Presence of cardiac pacemaker: Secondary | ICD-10-CM | POA: Diagnosis not present

## 2023-05-19 DIAGNOSIS — M179 Osteoarthritis of knee, unspecified: Secondary | ICD-10-CM | POA: Diagnosis not present

## 2023-05-19 DIAGNOSIS — F05 Delirium due to known physiological condition: Secondary | ICD-10-CM | POA: Diagnosis not present

## 2023-05-19 DIAGNOSIS — R54 Age-related physical debility: Secondary | ICD-10-CM | POA: Diagnosis not present

## 2023-05-19 DIAGNOSIS — Z8673 Personal history of transient ischemic attack (TIA), and cerebral infarction without residual deficits: Secondary | ICD-10-CM | POA: Diagnosis not present

## 2023-05-19 DIAGNOSIS — E43 Unspecified severe protein-calorie malnutrition: Secondary | ICD-10-CM | POA: Diagnosis not present

## 2023-05-19 DIAGNOSIS — I4891 Unspecified atrial fibrillation: Secondary | ICD-10-CM | POA: Diagnosis not present

## 2023-05-19 DIAGNOSIS — Z7401 Bed confinement status: Secondary | ICD-10-CM | POA: Diagnosis not present

## 2023-05-19 DIAGNOSIS — G9341 Metabolic encephalopathy: Secondary | ICD-10-CM | POA: Diagnosis not present

## 2023-05-19 DIAGNOSIS — I351 Nonrheumatic aortic (valve) insufficiency: Secondary | ICD-10-CM | POA: Diagnosis not present

## 2023-05-19 DIAGNOSIS — Z7901 Long term (current) use of anticoagulants: Secondary | ICD-10-CM | POA: Diagnosis not present

## 2023-05-19 DIAGNOSIS — G9340 Encephalopathy, unspecified: Secondary | ICD-10-CM | POA: Diagnosis not present

## 2023-05-19 DIAGNOSIS — J182 Hypostatic pneumonia, unspecified organism: Secondary | ICD-10-CM | POA: Diagnosis not present

## 2023-05-19 DIAGNOSIS — R55 Syncope and collapse: Secondary | ICD-10-CM | POA: Diagnosis not present

## 2023-05-19 DIAGNOSIS — J90 Pleural effusion, not elsewhere classified: Secondary | ICD-10-CM | POA: Diagnosis present

## 2023-05-19 DIAGNOSIS — Z539 Procedure and treatment not carried out, unspecified reason: Secondary | ICD-10-CM | POA: Diagnosis not present

## 2023-05-19 DIAGNOSIS — J9600 Acute respiratory failure, unspecified whether with hypoxia or hypercapnia: Secondary | ICD-10-CM | POA: Diagnosis not present

## 2023-05-19 DIAGNOSIS — J811 Chronic pulmonary edema: Secondary | ICD-10-CM | POA: Diagnosis not present

## 2023-05-19 DIAGNOSIS — I110 Hypertensive heart disease with heart failure: Secondary | ICD-10-CM | POA: Diagnosis not present

## 2023-05-19 DIAGNOSIS — A419 Sepsis, unspecified organism: Secondary | ICD-10-CM | POA: Diagnosis not present

## 2023-05-19 DIAGNOSIS — R062 Wheezing: Secondary | ICD-10-CM | POA: Diagnosis not present

## 2023-05-20 DIAGNOSIS — I4891 Unspecified atrial fibrillation: Secondary | ICD-10-CM | POA: Diagnosis not present

## 2023-05-20 DIAGNOSIS — G9341 Metabolic encephalopathy: Secondary | ICD-10-CM | POA: Diagnosis not present

## 2023-05-20 DIAGNOSIS — J181 Lobar pneumonia, unspecified organism: Secondary | ICD-10-CM | POA: Diagnosis not present

## 2023-05-20 DIAGNOSIS — J9600 Acute respiratory failure, unspecified whether with hypoxia or hypercapnia: Secondary | ICD-10-CM | POA: Diagnosis not present

## 2023-05-21 DIAGNOSIS — J90 Pleural effusion, not elsewhere classified: Secondary | ICD-10-CM | POA: Diagnosis not present

## 2023-05-21 DIAGNOSIS — F0390 Unspecified dementia without behavioral disturbance: Secondary | ICD-10-CM | POA: Diagnosis not present

## 2023-05-21 DIAGNOSIS — I5033 Acute on chronic diastolic (congestive) heart failure: Secondary | ICD-10-CM | POA: Diagnosis not present

## 2023-05-21 DIAGNOSIS — I4891 Unspecified atrial fibrillation: Secondary | ICD-10-CM | POA: Diagnosis not present

## 2023-05-21 DIAGNOSIS — E876 Hypokalemia: Secondary | ICD-10-CM | POA: Diagnosis not present

## 2023-05-21 DIAGNOSIS — Z7901 Long term (current) use of anticoagulants: Secondary | ICD-10-CM | POA: Diagnosis not present

## 2023-05-22 DIAGNOSIS — Z7901 Long term (current) use of anticoagulants: Secondary | ICD-10-CM | POA: Diagnosis not present

## 2023-05-22 DIAGNOSIS — J90 Pleural effusion, not elsewhere classified: Secondary | ICD-10-CM | POA: Diagnosis not present

## 2023-05-22 DIAGNOSIS — I340 Nonrheumatic mitral (valve) insufficiency: Secondary | ICD-10-CM | POA: Diagnosis not present

## 2023-05-22 DIAGNOSIS — I5033 Acute on chronic diastolic (congestive) heart failure: Secondary | ICD-10-CM | POA: Diagnosis not present

## 2023-05-22 DIAGNOSIS — Z79899 Other long term (current) drug therapy: Secondary | ICD-10-CM | POA: Diagnosis not present

## 2023-05-22 DIAGNOSIS — I351 Nonrheumatic aortic (valve) insufficiency: Secondary | ICD-10-CM | POA: Diagnosis not present

## 2023-05-22 DIAGNOSIS — I4891 Unspecified atrial fibrillation: Secondary | ICD-10-CM | POA: Diagnosis not present

## 2023-05-22 DIAGNOSIS — R0902 Hypoxemia: Secondary | ICD-10-CM | POA: Diagnosis not present

## 2023-05-22 DIAGNOSIS — F0390 Unspecified dementia without behavioral disturbance: Secondary | ICD-10-CM | POA: Diagnosis not present

## 2023-05-22 DIAGNOSIS — I110 Hypertensive heart disease with heart failure: Secondary | ICD-10-CM | POA: Diagnosis not present

## 2023-05-23 DIAGNOSIS — R570 Cardiogenic shock: Secondary | ICD-10-CM | POA: Diagnosis not present

## 2023-05-23 DIAGNOSIS — F05 Delirium due to known physiological condition: Secondary | ICD-10-CM | POA: Diagnosis not present

## 2023-05-23 DIAGNOSIS — I4891 Unspecified atrial fibrillation: Secondary | ICD-10-CM | POA: Diagnosis not present

## 2023-05-23 DIAGNOSIS — I110 Hypertensive heart disease with heart failure: Secondary | ICD-10-CM | POA: Diagnosis not present

## 2023-05-23 DIAGNOSIS — E43 Unspecified severe protein-calorie malnutrition: Secondary | ICD-10-CM | POA: Diagnosis not present

## 2023-05-23 DIAGNOSIS — R296 Repeated falls: Secondary | ICD-10-CM | POA: Diagnosis not present

## 2023-05-23 DIAGNOSIS — I482 Chronic atrial fibrillation, unspecified: Secondary | ICD-10-CM | POA: Diagnosis not present

## 2023-05-23 DIAGNOSIS — I5033 Acute on chronic diastolic (congestive) heart failure: Secondary | ICD-10-CM | POA: Diagnosis not present

## 2023-05-23 DIAGNOSIS — I5032 Chronic diastolic (congestive) heart failure: Secondary | ICD-10-CM | POA: Diagnosis not present

## 2023-05-23 DIAGNOSIS — J811 Chronic pulmonary edema: Secondary | ICD-10-CM | POA: Diagnosis not present

## 2023-05-23 DIAGNOSIS — F0390 Unspecified dementia without behavioral disturbance: Secondary | ICD-10-CM | POA: Diagnosis not present

## 2023-05-23 DIAGNOSIS — J90 Pleural effusion, not elsewhere classified: Secondary | ICD-10-CM | POA: Diagnosis not present

## 2023-05-23 DIAGNOSIS — Z79899 Other long term (current) drug therapy: Secondary | ICD-10-CM | POA: Diagnosis not present

## 2023-05-24 DIAGNOSIS — F0390 Unspecified dementia without behavioral disturbance: Secondary | ICD-10-CM | POA: Diagnosis not present

## 2023-05-24 DIAGNOSIS — R6521 Severe sepsis with septic shock: Secondary | ICD-10-CM | POA: Diagnosis not present

## 2023-05-24 DIAGNOSIS — I5030 Unspecified diastolic (congestive) heart failure: Secondary | ICD-10-CM | POA: Diagnosis not present

## 2023-05-24 DIAGNOSIS — J182 Hypostatic pneumonia, unspecified organism: Secondary | ICD-10-CM | POA: Diagnosis not present

## 2023-05-24 DIAGNOSIS — J9601 Acute respiratory failure with hypoxia: Secondary | ICD-10-CM | POA: Diagnosis not present

## 2023-05-24 DIAGNOSIS — I4891 Unspecified atrial fibrillation: Secondary | ICD-10-CM | POA: Diagnosis present

## 2023-05-24 DIAGNOSIS — R2689 Other abnormalities of gait and mobility: Secondary | ICD-10-CM | POA: Diagnosis not present

## 2023-05-24 DIAGNOSIS — I5033 Acute on chronic diastolic (congestive) heart failure: Secondary | ICD-10-CM | POA: Diagnosis not present

## 2023-05-24 DIAGNOSIS — Z8673 Personal history of transient ischemic attack (TIA), and cerebral infarction without residual deficits: Secondary | ICD-10-CM | POA: Diagnosis not present

## 2023-05-24 DIAGNOSIS — E870 Hyperosmolality and hypernatremia: Secondary | ICD-10-CM | POA: Diagnosis not present

## 2023-05-24 DIAGNOSIS — R1312 Dysphagia, oropharyngeal phase: Secondary | ICD-10-CM | POA: Diagnosis not present

## 2023-05-24 DIAGNOSIS — E43 Unspecified severe protein-calorie malnutrition: Secondary | ICD-10-CM | POA: Diagnosis not present

## 2023-05-24 DIAGNOSIS — R296 Repeated falls: Secondary | ICD-10-CM | POA: Diagnosis not present

## 2023-05-24 DIAGNOSIS — R570 Cardiogenic shock: Secondary | ICD-10-CM | POA: Diagnosis not present

## 2023-05-24 DIAGNOSIS — R531 Weakness: Secondary | ICD-10-CM | POA: Diagnosis not present

## 2023-05-24 DIAGNOSIS — R41841 Cognitive communication deficit: Secondary | ICD-10-CM | POA: Diagnosis not present

## 2023-05-24 DIAGNOSIS — R54 Age-related physical debility: Secondary | ICD-10-CM | POA: Diagnosis not present

## 2023-05-24 DIAGNOSIS — M47816 Spondylosis without myelopathy or radiculopathy, lumbar region: Secondary | ICD-10-CM | POA: Diagnosis not present

## 2023-05-24 DIAGNOSIS — J9811 Atelectasis: Secondary | ICD-10-CM | POA: Diagnosis not present

## 2023-05-24 DIAGNOSIS — R5381 Other malaise: Secondary | ICD-10-CM | POA: Diagnosis not present

## 2023-05-24 DIAGNOSIS — M179 Osteoarthritis of knee, unspecified: Secondary | ICD-10-CM | POA: Diagnosis not present

## 2023-05-24 DIAGNOSIS — R279 Unspecified lack of coordination: Secondary | ICD-10-CM | POA: Diagnosis not present

## 2023-05-24 DIAGNOSIS — F4321 Adjustment disorder with depressed mood: Secondary | ICD-10-CM | POA: Diagnosis not present

## 2023-05-24 DIAGNOSIS — M1990 Unspecified osteoarthritis, unspecified site: Secondary | ICD-10-CM | POA: Diagnosis not present

## 2023-05-24 DIAGNOSIS — R600 Localized edema: Secondary | ICD-10-CM | POA: Diagnosis not present

## 2023-05-24 DIAGNOSIS — Z7901 Long term (current) use of anticoagulants: Secondary | ICD-10-CM | POA: Diagnosis not present

## 2023-05-24 DIAGNOSIS — F339 Major depressive disorder, recurrent, unspecified: Secondary | ICD-10-CM | POA: Diagnosis not present

## 2023-05-24 DIAGNOSIS — E876 Hypokalemia: Secondary | ICD-10-CM | POA: Diagnosis not present

## 2023-05-24 DIAGNOSIS — G9340 Encephalopathy, unspecified: Secondary | ICD-10-CM | POA: Diagnosis not present

## 2023-05-24 DIAGNOSIS — H409 Unspecified glaucoma: Secondary | ICD-10-CM | POA: Diagnosis not present

## 2023-05-24 DIAGNOSIS — M109 Gout, unspecified: Secondary | ICD-10-CM | POA: Diagnosis not present

## 2023-05-24 DIAGNOSIS — Z950 Presence of cardiac pacemaker: Secondary | ICD-10-CM | POA: Diagnosis not present

## 2023-05-24 DIAGNOSIS — G4700 Insomnia, unspecified: Secondary | ICD-10-CM | POA: Diagnosis not present

## 2023-05-24 DIAGNOSIS — R0902 Hypoxemia: Secondary | ICD-10-CM | POA: Diagnosis not present

## 2023-05-24 DIAGNOSIS — M6281 Muscle weakness (generalized): Secondary | ICD-10-CM | POA: Diagnosis not present

## 2023-05-24 DIAGNOSIS — E440 Moderate protein-calorie malnutrition: Secondary | ICD-10-CM | POA: Diagnosis not present

## 2023-05-24 DIAGNOSIS — E119 Type 2 diabetes mellitus without complications: Secondary | ICD-10-CM | POA: Diagnosis present

## 2023-05-24 DIAGNOSIS — I5032 Chronic diastolic (congestive) heart failure: Secondary | ICD-10-CM | POA: Diagnosis not present

## 2023-05-24 DIAGNOSIS — J189 Pneumonia, unspecified organism: Secondary | ICD-10-CM | POA: Diagnosis not present

## 2023-05-24 DIAGNOSIS — I110 Hypertensive heart disease with heart failure: Secondary | ICD-10-CM | POA: Diagnosis present

## 2023-05-24 DIAGNOSIS — A419 Sepsis, unspecified organism: Secondary | ICD-10-CM | POA: Diagnosis not present

## 2023-05-24 DIAGNOSIS — Z7401 Bed confinement status: Secondary | ICD-10-CM | POA: Diagnosis not present

## 2023-05-24 DIAGNOSIS — J90 Pleural effusion, not elsewhere classified: Secondary | ICD-10-CM | POA: Diagnosis not present

## 2023-05-30 DIAGNOSIS — J189 Pneumonia, unspecified organism: Secondary | ICD-10-CM | POA: Diagnosis not present

## 2023-05-30 DIAGNOSIS — A419 Sepsis, unspecified organism: Secondary | ICD-10-CM | POA: Diagnosis not present

## 2023-05-30 DIAGNOSIS — R5381 Other malaise: Secondary | ICD-10-CM | POA: Diagnosis not present

## 2023-05-30 DIAGNOSIS — G9340 Encephalopathy, unspecified: Secondary | ICD-10-CM | POA: Diagnosis not present

## 2023-05-31 DIAGNOSIS — I5032 Chronic diastolic (congestive) heart failure: Secondary | ICD-10-CM | POA: Diagnosis not present

## 2023-05-31 DIAGNOSIS — F4321 Adjustment disorder with depressed mood: Secondary | ICD-10-CM | POA: Diagnosis not present

## 2023-05-31 DIAGNOSIS — J90 Pleural effusion, not elsewhere classified: Secondary | ICD-10-CM | POA: Diagnosis not present

## 2023-05-31 DIAGNOSIS — G9340 Encephalopathy, unspecified: Secondary | ICD-10-CM | POA: Diagnosis not present

## 2023-05-31 DIAGNOSIS — I4891 Unspecified atrial fibrillation: Secondary | ICD-10-CM | POA: Diagnosis not present

## 2023-05-31 DIAGNOSIS — R531 Weakness: Secondary | ICD-10-CM | POA: Diagnosis not present

## 2023-05-31 DIAGNOSIS — F0390 Unspecified dementia without behavioral disturbance: Secondary | ICD-10-CM | POA: Diagnosis not present

## 2023-05-31 DIAGNOSIS — G4700 Insomnia, unspecified: Secondary | ICD-10-CM | POA: Diagnosis not present

## 2023-05-31 DIAGNOSIS — F339 Major depressive disorder, recurrent, unspecified: Secondary | ICD-10-CM | POA: Diagnosis not present

## 2023-06-04 DIAGNOSIS — F339 Major depressive disorder, recurrent, unspecified: Secondary | ICD-10-CM | POA: Diagnosis not present

## 2023-06-07 DIAGNOSIS — G4700 Insomnia, unspecified: Secondary | ICD-10-CM | POA: Diagnosis not present

## 2023-06-07 DIAGNOSIS — F4321 Adjustment disorder with depressed mood: Secondary | ICD-10-CM | POA: Diagnosis not present

## 2023-06-07 DIAGNOSIS — I110 Hypertensive heart disease with heart failure: Secondary | ICD-10-CM | POA: Diagnosis not present

## 2023-06-07 DIAGNOSIS — I5032 Chronic diastolic (congestive) heart failure: Secondary | ICD-10-CM | POA: Diagnosis not present

## 2023-06-07 DIAGNOSIS — F339 Major depressive disorder, recurrent, unspecified: Secondary | ICD-10-CM | POA: Diagnosis not present

## 2023-06-07 DIAGNOSIS — Z950 Presence of cardiac pacemaker: Secondary | ICD-10-CM | POA: Diagnosis not present

## 2023-06-07 DIAGNOSIS — F0390 Unspecified dementia without behavioral disturbance: Secondary | ICD-10-CM | POA: Diagnosis not present

## 2023-06-07 DIAGNOSIS — I4891 Unspecified atrial fibrillation: Secondary | ICD-10-CM | POA: Diagnosis not present

## 2023-06-07 DIAGNOSIS — E119 Type 2 diabetes mellitus without complications: Secondary | ICD-10-CM | POA: Diagnosis not present

## 2023-06-07 DIAGNOSIS — R531 Weakness: Secondary | ICD-10-CM | POA: Diagnosis not present

## 2023-06-07 DIAGNOSIS — R5381 Other malaise: Secondary | ICD-10-CM | POA: Diagnosis not present

## 2023-06-13 ENCOUNTER — Other Ambulatory Visit: Payer: Self-pay | Admitting: *Deleted

## 2023-06-13 NOTE — Patient Outreach (Signed)
Per [facility] Mr. Shirazi resides in [facility] skilled nursing facility. Screening for potential [facility] care coordination services as benefit of health plan and Primary Care Provider.   Confirmed with [facility] social worker. Mr. Holz resides as long term resident.   No identifiable care coordination needs.   Raiford Noble, MSN, RN,BSN [facility] Care Coordinator [phone] (Direct dial)

## 2023-06-14 DIAGNOSIS — F0390 Unspecified dementia without behavioral disturbance: Secondary | ICD-10-CM | POA: Diagnosis not present

## 2023-06-14 DIAGNOSIS — F339 Major depressive disorder, recurrent, unspecified: Secondary | ICD-10-CM | POA: Diagnosis not present

## 2023-06-14 DIAGNOSIS — F4321 Adjustment disorder with depressed mood: Secondary | ICD-10-CM | POA: Diagnosis not present

## 2023-06-14 DIAGNOSIS — G4700 Insomnia, unspecified: Secondary | ICD-10-CM | POA: Diagnosis not present

## 2023-06-20 DIAGNOSIS — M109 Gout, unspecified: Secondary | ICD-10-CM | POA: Diagnosis not present

## 2023-06-20 DIAGNOSIS — R600 Localized edema: Secondary | ICD-10-CM | POA: Diagnosis not present

## 2023-06-21 DIAGNOSIS — F0390 Unspecified dementia without behavioral disturbance: Secondary | ICD-10-CM | POA: Diagnosis not present

## 2023-06-21 DIAGNOSIS — G4700 Insomnia, unspecified: Secondary | ICD-10-CM | POA: Diagnosis not present

## 2023-06-21 DIAGNOSIS — R5381 Other malaise: Secondary | ICD-10-CM | POA: Diagnosis not present

## 2023-06-21 DIAGNOSIS — M1990 Unspecified osteoarthritis, unspecified site: Secondary | ICD-10-CM | POA: Diagnosis not present

## 2023-06-21 DIAGNOSIS — F339 Major depressive disorder, recurrent, unspecified: Secondary | ICD-10-CM | POA: Diagnosis not present

## 2023-06-21 DIAGNOSIS — R600 Localized edema: Secondary | ICD-10-CM | POA: Diagnosis not present

## 2023-06-21 DIAGNOSIS — F4321 Adjustment disorder with depressed mood: Secondary | ICD-10-CM | POA: Diagnosis not present

## 2023-06-22 ENCOUNTER — Ambulatory Visit: Payer: Medicare Other | Admitting: Nurse Practitioner

## 2023-07-05 DIAGNOSIS — F0390 Unspecified dementia without behavioral disturbance: Secondary | ICD-10-CM | POA: Diagnosis not present

## 2023-07-05 DIAGNOSIS — F339 Major depressive disorder, recurrent, unspecified: Secondary | ICD-10-CM | POA: Diagnosis not present

## 2023-07-05 DIAGNOSIS — G4700 Insomnia, unspecified: Secondary | ICD-10-CM | POA: Diagnosis not present

## 2023-07-10 ENCOUNTER — Ambulatory Visit (INDEPENDENT_AMBULATORY_CARE_PROVIDER_SITE_OTHER): Payer: Medicare Other

## 2023-07-10 DIAGNOSIS — I495 Sick sinus syndrome: Secondary | ICD-10-CM | POA: Diagnosis not present

## 2023-07-12 DIAGNOSIS — B351 Tinea unguium: Secondary | ICD-10-CM | POA: Diagnosis not present

## 2023-07-12 DIAGNOSIS — E1159 Type 2 diabetes mellitus with other circulatory complications: Secondary | ICD-10-CM | POA: Diagnosis not present

## 2023-07-18 DIAGNOSIS — F339 Major depressive disorder, recurrent, unspecified: Secondary | ICD-10-CM | POA: Diagnosis not present

## 2023-07-18 LAB — CUP PACEART REMOTE DEVICE CHECK
Battery Remaining Longevity: 13 mo
Battery Remaining Percentage: 11 %
Battery Voltage: 2.84 V
Brady Statistic RV Percent Paced: 73 %
Date Time Interrogation Session: 20240717152503
Implantable Lead Connection Status: 753985
Implantable Lead Connection Status: 753985
Implantable Lead Implant Date: 20140919
Implantable Lead Implant Date: 20140919
Implantable Lead Location: 753859
Implantable Lead Location: 753860
Implantable Pulse Generator Implant Date: 20140919
Lead Channel Impedance Value: 390 Ohm
Lead Channel Pacing Threshold Amplitude: 0.75 V
Lead Channel Pacing Threshold Pulse Width: 0.5 ms
Lead Channel Sensing Intrinsic Amplitude: 5.1 mV
Lead Channel Setting Pacing Amplitude: 2.5 V
Lead Channel Setting Pacing Pulse Width: 0.5 ms
Lead Channel Setting Sensing Sensitivity: 2 mV
Pulse Gen Model: 2240
Pulse Gen Serial Number: 7536055

## 2023-07-25 NOTE — Progress Notes (Signed)
Remote pacemaker transmission.   

## 2023-08-02 DIAGNOSIS — G4700 Insomnia, unspecified: Secondary | ICD-10-CM | POA: Diagnosis not present

## 2023-08-02 DIAGNOSIS — F0390 Unspecified dementia without behavioral disturbance: Secondary | ICD-10-CM | POA: Diagnosis not present

## 2023-08-02 DIAGNOSIS — F339 Major depressive disorder, recurrent, unspecified: Secondary | ICD-10-CM | POA: Diagnosis not present

## 2023-08-09 DIAGNOSIS — F339 Major depressive disorder, recurrent, unspecified: Secondary | ICD-10-CM | POA: Diagnosis not present

## 2023-08-11 DIAGNOSIS — N390 Urinary tract infection, site not specified: Secondary | ICD-10-CM | POA: Diagnosis not present

## 2023-08-16 ENCOUNTER — Encounter (INDEPENDENT_AMBULATORY_CARE_PROVIDER_SITE_OTHER): Payer: Medicare Other | Admitting: Ophthalmology

## 2023-08-16 DIAGNOSIS — I10 Essential (primary) hypertension: Secondary | ICD-10-CM

## 2023-08-16 DIAGNOSIS — H35033 Hypertensive retinopathy, bilateral: Secondary | ICD-10-CM | POA: Diagnosis not present

## 2023-08-16 DIAGNOSIS — F0390 Unspecified dementia without behavioral disturbance: Secondary | ICD-10-CM | POA: Diagnosis not present

## 2023-08-16 DIAGNOSIS — H34832 Tributary (branch) retinal vein occlusion, left eye, with macular edema: Secondary | ICD-10-CM

## 2023-08-16 DIAGNOSIS — G4700 Insomnia, unspecified: Secondary | ICD-10-CM | POA: Diagnosis not present

## 2023-08-16 DIAGNOSIS — H43813 Vitreous degeneration, bilateral: Secondary | ICD-10-CM

## 2023-08-16 DIAGNOSIS — F339 Major depressive disorder, recurrent, unspecified: Secondary | ICD-10-CM | POA: Diagnosis not present

## 2023-09-01 DIAGNOSIS — R41841 Cognitive communication deficit: Secondary | ICD-10-CM | POA: Diagnosis not present

## 2023-09-01 DIAGNOSIS — R1312 Dysphagia, oropharyngeal phase: Secondary | ICD-10-CM | POA: Diagnosis not present

## 2023-09-01 DIAGNOSIS — M6281 Muscle weakness (generalized): Secondary | ICD-10-CM | POA: Diagnosis not present

## 2023-09-03 DIAGNOSIS — R1312 Dysphagia, oropharyngeal phase: Secondary | ICD-10-CM | POA: Diagnosis not present

## 2023-09-03 DIAGNOSIS — M6281 Muscle weakness (generalized): Secondary | ICD-10-CM | POA: Diagnosis not present

## 2023-09-03 DIAGNOSIS — R41841 Cognitive communication deficit: Secondary | ICD-10-CM | POA: Diagnosis not present

## 2023-09-04 DIAGNOSIS — I5032 Chronic diastolic (congestive) heart failure: Secondary | ICD-10-CM | POA: Diagnosis not present

## 2023-09-04 DIAGNOSIS — R41841 Cognitive communication deficit: Secondary | ICD-10-CM | POA: Diagnosis not present

## 2023-09-04 DIAGNOSIS — F0390 Unspecified dementia without behavioral disturbance: Secondary | ICD-10-CM | POA: Diagnosis not present

## 2023-09-04 DIAGNOSIS — I10 Essential (primary) hypertension: Secondary | ICD-10-CM | POA: Diagnosis not present

## 2023-09-04 DIAGNOSIS — R1312 Dysphagia, oropharyngeal phase: Secondary | ICD-10-CM | POA: Diagnosis not present

## 2023-09-04 DIAGNOSIS — M6281 Muscle weakness (generalized): Secondary | ICD-10-CM | POA: Diagnosis not present

## 2023-09-05 DIAGNOSIS — M6281 Muscle weakness (generalized): Secondary | ICD-10-CM | POA: Diagnosis not present

## 2023-09-05 DIAGNOSIS — R1312 Dysphagia, oropharyngeal phase: Secondary | ICD-10-CM | POA: Diagnosis not present

## 2023-09-05 DIAGNOSIS — R41841 Cognitive communication deficit: Secondary | ICD-10-CM | POA: Diagnosis not present

## 2023-09-06 DIAGNOSIS — R1312 Dysphagia, oropharyngeal phase: Secondary | ICD-10-CM | POA: Diagnosis not present

## 2023-09-06 DIAGNOSIS — R41841 Cognitive communication deficit: Secondary | ICD-10-CM | POA: Diagnosis not present

## 2023-09-06 DIAGNOSIS — M6281 Muscle weakness (generalized): Secondary | ICD-10-CM | POA: Diagnosis not present

## 2023-09-07 DIAGNOSIS — M6281 Muscle weakness (generalized): Secondary | ICD-10-CM | POA: Diagnosis not present

## 2023-09-07 DIAGNOSIS — R1312 Dysphagia, oropharyngeal phase: Secondary | ICD-10-CM | POA: Diagnosis not present

## 2023-09-07 DIAGNOSIS — R41841 Cognitive communication deficit: Secondary | ICD-10-CM | POA: Diagnosis not present

## 2023-09-08 DIAGNOSIS — R41841 Cognitive communication deficit: Secondary | ICD-10-CM | POA: Diagnosis not present

## 2023-09-08 DIAGNOSIS — M6281 Muscle weakness (generalized): Secondary | ICD-10-CM | POA: Diagnosis not present

## 2023-09-08 DIAGNOSIS — R1312 Dysphagia, oropharyngeal phase: Secondary | ICD-10-CM | POA: Diagnosis not present

## 2023-09-11 DIAGNOSIS — R41841 Cognitive communication deficit: Secondary | ICD-10-CM | POA: Diagnosis not present

## 2023-09-11 DIAGNOSIS — M6281 Muscle weakness (generalized): Secondary | ICD-10-CM | POA: Diagnosis not present

## 2023-09-11 DIAGNOSIS — R1312 Dysphagia, oropharyngeal phase: Secondary | ICD-10-CM | POA: Diagnosis not present

## 2023-09-12 DIAGNOSIS — R1312 Dysphagia, oropharyngeal phase: Secondary | ICD-10-CM | POA: Diagnosis not present

## 2023-09-12 DIAGNOSIS — F339 Major depressive disorder, recurrent, unspecified: Secondary | ICD-10-CM | POA: Diagnosis not present

## 2023-09-12 DIAGNOSIS — R41841 Cognitive communication deficit: Secondary | ICD-10-CM | POA: Diagnosis not present

## 2023-09-12 DIAGNOSIS — M6281 Muscle weakness (generalized): Secondary | ICD-10-CM | POA: Diagnosis not present

## 2023-09-13 DIAGNOSIS — M6281 Muscle weakness (generalized): Secondary | ICD-10-CM | POA: Diagnosis not present

## 2023-09-13 DIAGNOSIS — F339 Major depressive disorder, recurrent, unspecified: Secondary | ICD-10-CM | POA: Diagnosis not present

## 2023-09-13 DIAGNOSIS — G4700 Insomnia, unspecified: Secondary | ICD-10-CM | POA: Diagnosis not present

## 2023-09-13 DIAGNOSIS — F0390 Unspecified dementia without behavioral disturbance: Secondary | ICD-10-CM | POA: Diagnosis not present

## 2023-09-13 DIAGNOSIS — R1312 Dysphagia, oropharyngeal phase: Secondary | ICD-10-CM | POA: Diagnosis not present

## 2023-09-13 DIAGNOSIS — R41841 Cognitive communication deficit: Secondary | ICD-10-CM | POA: Diagnosis not present

## 2023-09-14 DIAGNOSIS — R1312 Dysphagia, oropharyngeal phase: Secondary | ICD-10-CM | POA: Diagnosis not present

## 2023-09-14 DIAGNOSIS — R41841 Cognitive communication deficit: Secondary | ICD-10-CM | POA: Diagnosis not present

## 2023-09-14 DIAGNOSIS — M6281 Muscle weakness (generalized): Secondary | ICD-10-CM | POA: Diagnosis not present

## 2023-09-15 DIAGNOSIS — R1312 Dysphagia, oropharyngeal phase: Secondary | ICD-10-CM | POA: Diagnosis not present

## 2023-09-15 DIAGNOSIS — R41841 Cognitive communication deficit: Secondary | ICD-10-CM | POA: Diagnosis not present

## 2023-09-15 DIAGNOSIS — M6281 Muscle weakness (generalized): Secondary | ICD-10-CM | POA: Diagnosis not present

## 2023-09-18 DIAGNOSIS — R41841 Cognitive communication deficit: Secondary | ICD-10-CM | POA: Diagnosis not present

## 2023-09-18 DIAGNOSIS — R1312 Dysphagia, oropharyngeal phase: Secondary | ICD-10-CM | POA: Diagnosis not present

## 2023-09-18 DIAGNOSIS — M6281 Muscle weakness (generalized): Secondary | ICD-10-CM | POA: Diagnosis not present

## 2023-09-19 DIAGNOSIS — R1312 Dysphagia, oropharyngeal phase: Secondary | ICD-10-CM | POA: Diagnosis not present

## 2023-09-19 DIAGNOSIS — R41841 Cognitive communication deficit: Secondary | ICD-10-CM | POA: Diagnosis not present

## 2023-09-19 DIAGNOSIS — M6281 Muscle weakness (generalized): Secondary | ICD-10-CM | POA: Diagnosis not present

## 2023-09-20 DIAGNOSIS — R41841 Cognitive communication deficit: Secondary | ICD-10-CM | POA: Diagnosis not present

## 2023-09-20 DIAGNOSIS — R1312 Dysphagia, oropharyngeal phase: Secondary | ICD-10-CM | POA: Diagnosis not present

## 2023-09-20 DIAGNOSIS — R21 Rash and other nonspecific skin eruption: Secondary | ICD-10-CM | POA: Diagnosis not present

## 2023-09-20 DIAGNOSIS — I5032 Chronic diastolic (congestive) heart failure: Secondary | ICD-10-CM | POA: Diagnosis not present

## 2023-09-20 DIAGNOSIS — Z950 Presence of cardiac pacemaker: Secondary | ICD-10-CM | POA: Diagnosis not present

## 2023-09-20 DIAGNOSIS — I10 Essential (primary) hypertension: Secondary | ICD-10-CM | POA: Diagnosis not present

## 2023-09-20 DIAGNOSIS — M6281 Muscle weakness (generalized): Secondary | ICD-10-CM | POA: Diagnosis not present

## 2023-09-20 DIAGNOSIS — I110 Hypertensive heart disease with heart failure: Secondary | ICD-10-CM | POA: Diagnosis not present

## 2023-09-20 DIAGNOSIS — H60502 Unspecified acute noninfective otitis externa, left ear: Secondary | ICD-10-CM | POA: Diagnosis not present

## 2023-09-20 DIAGNOSIS — E119 Type 2 diabetes mellitus without complications: Secondary | ICD-10-CM | POA: Diagnosis not present

## 2023-09-20 DIAGNOSIS — F0390 Unspecified dementia without behavioral disturbance: Secondary | ICD-10-CM | POA: Diagnosis not present

## 2023-09-21 DIAGNOSIS — R1312 Dysphagia, oropharyngeal phase: Secondary | ICD-10-CM | POA: Diagnosis not present

## 2023-09-21 DIAGNOSIS — I10 Essential (primary) hypertension: Secondary | ICD-10-CM | POA: Diagnosis not present

## 2023-09-21 DIAGNOSIS — E559 Vitamin D deficiency, unspecified: Secondary | ICD-10-CM | POA: Diagnosis not present

## 2023-09-21 DIAGNOSIS — E118 Type 2 diabetes mellitus with unspecified complications: Secondary | ICD-10-CM | POA: Diagnosis not present

## 2023-09-21 DIAGNOSIS — M6281 Muscle weakness (generalized): Secondary | ICD-10-CM | POA: Diagnosis not present

## 2023-09-21 DIAGNOSIS — R41841 Cognitive communication deficit: Secondary | ICD-10-CM | POA: Diagnosis not present

## 2023-09-22 DIAGNOSIS — R41841 Cognitive communication deficit: Secondary | ICD-10-CM | POA: Diagnosis not present

## 2023-09-22 DIAGNOSIS — R1312 Dysphagia, oropharyngeal phase: Secondary | ICD-10-CM | POA: Diagnosis not present

## 2023-09-22 DIAGNOSIS — M6281 Muscle weakness (generalized): Secondary | ICD-10-CM | POA: Diagnosis not present

## 2023-09-24 DIAGNOSIS — R1312 Dysphagia, oropharyngeal phase: Secondary | ICD-10-CM | POA: Diagnosis not present

## 2023-09-24 DIAGNOSIS — M6281 Muscle weakness (generalized): Secondary | ICD-10-CM | POA: Diagnosis not present

## 2023-09-24 DIAGNOSIS — R41841 Cognitive communication deficit: Secondary | ICD-10-CM | POA: Diagnosis not present

## 2023-09-25 DIAGNOSIS — R1312 Dysphagia, oropharyngeal phase: Secondary | ICD-10-CM | POA: Diagnosis not present

## 2023-09-25 DIAGNOSIS — M6281 Muscle weakness (generalized): Secondary | ICD-10-CM | POA: Diagnosis not present

## 2023-09-25 DIAGNOSIS — R41841 Cognitive communication deficit: Secondary | ICD-10-CM | POA: Diagnosis not present

## 2023-09-26 DIAGNOSIS — R41841 Cognitive communication deficit: Secondary | ICD-10-CM | POA: Diagnosis not present

## 2023-09-26 DIAGNOSIS — R1312 Dysphagia, oropharyngeal phase: Secondary | ICD-10-CM | POA: Diagnosis not present

## 2023-09-26 DIAGNOSIS — M6281 Muscle weakness (generalized): Secondary | ICD-10-CM | POA: Diagnosis not present

## 2023-09-27 DIAGNOSIS — R1312 Dysphagia, oropharyngeal phase: Secondary | ICD-10-CM | POA: Diagnosis not present

## 2023-09-27 DIAGNOSIS — M6281 Muscle weakness (generalized): Secondary | ICD-10-CM | POA: Diagnosis not present

## 2023-09-27 DIAGNOSIS — R41841 Cognitive communication deficit: Secondary | ICD-10-CM | POA: Diagnosis not present

## 2023-09-28 DIAGNOSIS — M6281 Muscle weakness (generalized): Secondary | ICD-10-CM | POA: Diagnosis not present

## 2023-09-28 DIAGNOSIS — R1312 Dysphagia, oropharyngeal phase: Secondary | ICD-10-CM | POA: Diagnosis not present

## 2023-09-28 DIAGNOSIS — R41841 Cognitive communication deficit: Secondary | ICD-10-CM | POA: Diagnosis not present

## 2023-09-29 DIAGNOSIS — R41841 Cognitive communication deficit: Secondary | ICD-10-CM | POA: Diagnosis not present

## 2023-09-29 DIAGNOSIS — R1312 Dysphagia, oropharyngeal phase: Secondary | ICD-10-CM | POA: Diagnosis not present

## 2023-09-29 DIAGNOSIS — M6281 Muscle weakness (generalized): Secondary | ICD-10-CM | POA: Diagnosis not present

## 2023-10-02 DIAGNOSIS — R41841 Cognitive communication deficit: Secondary | ICD-10-CM | POA: Diagnosis not present

## 2023-10-02 DIAGNOSIS — R1312 Dysphagia, oropharyngeal phase: Secondary | ICD-10-CM | POA: Diagnosis not present

## 2023-10-02 DIAGNOSIS — M6281 Muscle weakness (generalized): Secondary | ICD-10-CM | POA: Diagnosis not present

## 2023-10-03 DIAGNOSIS — R1312 Dysphagia, oropharyngeal phase: Secondary | ICD-10-CM | POA: Diagnosis not present

## 2023-10-03 DIAGNOSIS — M6281 Muscle weakness (generalized): Secondary | ICD-10-CM | POA: Diagnosis not present

## 2023-10-03 DIAGNOSIS — R41841 Cognitive communication deficit: Secondary | ICD-10-CM | POA: Diagnosis not present

## 2023-10-04 DIAGNOSIS — R1312 Dysphagia, oropharyngeal phase: Secondary | ICD-10-CM | POA: Diagnosis not present

## 2023-10-04 DIAGNOSIS — R41841 Cognitive communication deficit: Secondary | ICD-10-CM | POA: Diagnosis not present

## 2023-10-04 DIAGNOSIS — B351 Tinea unguium: Secondary | ICD-10-CM | POA: Diagnosis not present

## 2023-10-04 DIAGNOSIS — M6281 Muscle weakness (generalized): Secondary | ICD-10-CM | POA: Diagnosis not present

## 2023-10-04 DIAGNOSIS — E1159 Type 2 diabetes mellitus with other circulatory complications: Secondary | ICD-10-CM | POA: Diagnosis not present

## 2023-10-05 DIAGNOSIS — R1312 Dysphagia, oropharyngeal phase: Secondary | ICD-10-CM | POA: Diagnosis not present

## 2023-10-05 DIAGNOSIS — M6281 Muscle weakness (generalized): Secondary | ICD-10-CM | POA: Diagnosis not present

## 2023-10-05 DIAGNOSIS — R41841 Cognitive communication deficit: Secondary | ICD-10-CM | POA: Diagnosis not present

## 2023-10-06 DIAGNOSIS — R1312 Dysphagia, oropharyngeal phase: Secondary | ICD-10-CM | POA: Diagnosis not present

## 2023-10-06 DIAGNOSIS — R41841 Cognitive communication deficit: Secondary | ICD-10-CM | POA: Diagnosis not present

## 2023-10-06 DIAGNOSIS — M6281 Muscle weakness (generalized): Secondary | ICD-10-CM | POA: Diagnosis not present

## 2023-10-09 ENCOUNTER — Ambulatory Visit (INDEPENDENT_AMBULATORY_CARE_PROVIDER_SITE_OTHER): Payer: Medicare Other

## 2023-10-09 DIAGNOSIS — I495 Sick sinus syndrome: Secondary | ICD-10-CM

## 2023-10-09 DIAGNOSIS — R1312 Dysphagia, oropharyngeal phase: Secondary | ICD-10-CM | POA: Diagnosis not present

## 2023-10-09 DIAGNOSIS — R41841 Cognitive communication deficit: Secondary | ICD-10-CM | POA: Diagnosis not present

## 2023-10-09 DIAGNOSIS — M6281 Muscle weakness (generalized): Secondary | ICD-10-CM | POA: Diagnosis not present

## 2023-10-10 DIAGNOSIS — F0390 Unspecified dementia without behavioral disturbance: Secondary | ICD-10-CM | POA: Diagnosis not present

## 2023-10-10 DIAGNOSIS — I110 Hypertensive heart disease with heart failure: Secondary | ICD-10-CM | POA: Diagnosis not present

## 2023-10-10 DIAGNOSIS — I5032 Chronic diastolic (congestive) heart failure: Secondary | ICD-10-CM | POA: Diagnosis not present

## 2023-10-10 DIAGNOSIS — M6281 Muscle weakness (generalized): Secondary | ICD-10-CM | POA: Diagnosis not present

## 2023-10-10 DIAGNOSIS — I10 Essential (primary) hypertension: Secondary | ICD-10-CM | POA: Diagnosis not present

## 2023-10-10 DIAGNOSIS — R41841 Cognitive communication deficit: Secondary | ICD-10-CM | POA: Diagnosis not present

## 2023-10-10 DIAGNOSIS — R1312 Dysphagia, oropharyngeal phase: Secondary | ICD-10-CM | POA: Diagnosis not present

## 2023-10-10 DIAGNOSIS — Z950 Presence of cardiac pacemaker: Secondary | ICD-10-CM | POA: Diagnosis not present

## 2023-10-11 DIAGNOSIS — F0390 Unspecified dementia without behavioral disturbance: Secondary | ICD-10-CM | POA: Diagnosis not present

## 2023-10-11 DIAGNOSIS — G4700 Insomnia, unspecified: Secondary | ICD-10-CM | POA: Diagnosis not present

## 2023-10-11 DIAGNOSIS — F339 Major depressive disorder, recurrent, unspecified: Secondary | ICD-10-CM | POA: Diagnosis not present

## 2023-10-11 LAB — CUP PACEART REMOTE DEVICE CHECK
Battery Remaining Longevity: 12 mo
Battery Remaining Percentage: 10 %
Battery Voltage: 2.83 V
Brady Statistic RV Percent Paced: 72 %
Date Time Interrogation Session: 20241014020014
Implantable Lead Connection Status: 753985
Implantable Lead Connection Status: 753985
Implantable Lead Implant Date: 20140919
Implantable Lead Implant Date: 20140919
Implantable Lead Location: 753859
Implantable Lead Location: 753860
Implantable Pulse Generator Implant Date: 20140919
Lead Channel Impedance Value: 390 Ohm
Lead Channel Pacing Threshold Amplitude: 0.75 V
Lead Channel Pacing Threshold Pulse Width: 0.5 ms
Lead Channel Sensing Intrinsic Amplitude: 3.4 mV
Lead Channel Setting Pacing Amplitude: 2.5 V
Lead Channel Setting Pacing Pulse Width: 0.5 ms
Lead Channel Setting Sensing Sensitivity: 2 mV
Pulse Gen Model: 2240
Pulse Gen Serial Number: 7536055

## 2023-10-19 DIAGNOSIS — R5381 Other malaise: Secondary | ICD-10-CM | POA: Diagnosis not present

## 2023-10-19 DIAGNOSIS — M1990 Unspecified osteoarthritis, unspecified site: Secondary | ICD-10-CM | POA: Diagnosis not present

## 2023-10-19 DIAGNOSIS — I5032 Chronic diastolic (congestive) heart failure: Secondary | ICD-10-CM | POA: Diagnosis not present

## 2023-10-20 DIAGNOSIS — N390 Urinary tract infection, site not specified: Secondary | ICD-10-CM | POA: Diagnosis not present

## 2023-10-20 DIAGNOSIS — I10 Essential (primary) hypertension: Secondary | ICD-10-CM | POA: Diagnosis not present

## 2023-10-20 DIAGNOSIS — Z950 Presence of cardiac pacemaker: Secondary | ICD-10-CM | POA: Diagnosis not present

## 2023-10-20 DIAGNOSIS — I110 Hypertensive heart disease with heart failure: Secondary | ICD-10-CM | POA: Diagnosis not present

## 2023-10-25 NOTE — Progress Notes (Signed)
Remote pacemaker transmission.   

## 2023-11-08 ENCOUNTER — Ambulatory Visit: Payer: Medicare Other | Attending: Cardiology | Admitting: Cardiology

## 2023-11-08 ENCOUNTER — Encounter: Payer: Self-pay | Admitting: Cardiology

## 2023-11-08 VITALS — BP 108/62 | HR 66 | Ht 72.0 in | Wt 142.4 lb

## 2023-11-08 DIAGNOSIS — F5101 Primary insomnia: Secondary | ICD-10-CM | POA: Diagnosis not present

## 2023-11-08 DIAGNOSIS — I5032 Chronic diastolic (congestive) heart failure: Secondary | ICD-10-CM | POA: Insufficient documentation

## 2023-11-08 DIAGNOSIS — I10 Essential (primary) hypertension: Secondary | ICD-10-CM | POA: Insufficient documentation

## 2023-11-08 DIAGNOSIS — Z950 Presence of cardiac pacemaker: Secondary | ICD-10-CM | POA: Insufficient documentation

## 2023-11-08 DIAGNOSIS — F0390 Unspecified dementia without behavioral disturbance: Secondary | ICD-10-CM | POA: Diagnosis not present

## 2023-11-08 DIAGNOSIS — F339 Major depressive disorder, recurrent, unspecified: Secondary | ICD-10-CM | POA: Diagnosis not present

## 2023-11-08 DIAGNOSIS — I4891 Unspecified atrial fibrillation: Secondary | ICD-10-CM | POA: Insufficient documentation

## 2023-11-08 DIAGNOSIS — I2510 Atherosclerotic heart disease of native coronary artery without angina pectoris: Secondary | ICD-10-CM | POA: Insufficient documentation

## 2023-11-08 MED ORDER — PRAVASTATIN SODIUM 20 MG PO TABS: 20.0000 mg | ORAL_TABLET | Freq: Every evening | ORAL | 3 refills | Status: AC

## 2023-11-08 NOTE — Patient Instructions (Signed)
Medication Instructions:  Your physician has recommended you make the following change in your medication:   -Start Pravastatin 20 mg tablet once daily.   *If you need a refill on your cardiac medications before your next appointment, please call your pharmacy*   Lab Work: None If you have labs (blood work) drawn today and your tests are completely normal, you will receive your results only by: MyChart Message (if you have MyChart) OR A paper copy in the mail If you have any lab test that is abnormal or we need to change your treatment, we will call you to review the results.   Testing/Procedures: None   Follow-Up: At [facility], you and your health needs are our priority.  As part of our continuing mission to provide you with exceptional heart care, we have created designated Provider Care Teams.  These Care Teams include your primary Cardiologist (physician) and Advanced Practice Providers (APPs -  Physician Assistants and Nurse Practitioners) who all work together to provide you with the care you need, when you need it.  We recommend signing up for the patient portal called "MyChart".  Sign up information is provided on this After Visit Summary.  MyChart is used to connect with patients for Virtual Visits (Telemedicine).  Patients are able to view lab/test results, encounter notes, upcoming appointments, etc.  Non-urgent messages can be sent to your provider as well.   To learn more about what you can do with MyChart, go to [email or web address].    Your next appointment:   6 month(s)  Provider:   You may see Dina Rich, MD or one of the following Advanced Practice Providers on your designated Care Team:   Randall An, PA-C  Jacolyn Reedy, [place]     Other Instructions

## 2023-11-08 NOTE — Progress Notes (Signed)
Clinical Summary Mr. Acri is a 89 y.o.male seen today for follow up of the following medical problems.    1. HFimpEF - echo 07/2012 w/ LVEF 40-45%   - Cath 08/2013 w/ minimal CAD, LV gram 60% with normal filling pressures on RHC.   - 2018 echo LVEF 55-60%.   - We have avoided aggressive diuretic dosing due to chronic orthostatic dizziness.  Jan 2024 echo: LVEF 55-60%, no WMAs, indet diastolic fxn   - no SOB, no recent edema - compliant with meds   2. HTN    - accepting higher bp's due to prior dizziness  - previously stopped norvasc to see if would help with LE edema  - compliant with meds     3. Bradycardia   -s/p [employer] dual chamber pacemaker   - followed by EP  09/2023 normal device check.  - no symptoms     4. Afib - - no recent symptoms - compliant with meds. No bleeding on eliquis.    5. CAD   - patent major arteries with evidence of disease in diags and OMs from prior cath   - no symptoms    6. Dementia - in nursing home since Jan 2024. Recurrent falls at homes, progressing dementia. - falls have resolved.  - dementia seems to be progressing each visit I have with him   SH: wife on chemo and radiation along with recent hysterectomy, endometrial cancer. Just completed treatments, told she is cancer free Past Medical History:  Diagnosis Date   Allergic rhinitis    Anemia    Anxiety    Arthritis    "all over my body" (09/10/2013)   BPH (benign prostatic hypertrophy)    CAD (coronary artery disease)    a. 08/2013 LM nl, LAD 62m, D1 80ost, 95p, LCX 66m, RCA 20d, EF 60%.   Chronic lower back pain    Depression    Diabetes mellitus    "diet controlled" (09/10/2013)   Diaphragmatic hernia without mention of obstruction or gangrene    Diastolic congestive heart failure (HCC)    Elevated liver function tests    Essential hypertension, benign    Lacunar infarction (HCC)    a. 08/2013 post-cath, MRI: sm acute lacunar infarcts in LPCA and LSCA, ? tiny  RPICA lacunar infarct.   Obesity    OSA on CPAP    "wears mask part of the time" (09/10/2013)   Osteoporosis    Sinus node dysfunction (HCC)    a. 08/2013 s/p SJM DC PPM.     Allergies  Allergen Reactions   Fentanyl Other (See Comments)    resp suppression per wife, out for 2 days      Current Outpatient Medications  Medication Sig Dispense Refill   acetaminophen (TYLENOL) 500 MG tablet Take 500 mg by mouth every 6 (six) hours as needed for pain.     alendronate (FOSAMAX) 70 MG tablet Take 70 mg by mouth every 7 (seven) days. Take with a full glass of water on an empty stomach. On Mondays     amLODipine (NORVASC) 5 MG tablet Take 5 mg by mouth daily.     apixaban (ELIQUIS) 5 MG TABS tablet Take 1 tablet (5 mg total) by mouth 2 (two) times daily. 180 tablet 1   Calcium Carbonate-Vitamin D (CALCIUM 600 + D PO) Take 600 mg by mouth 2 (two) times daily.     donepezil (ARICEPT) 5 MG tablet Take 10 mg by mouth daily.  dorzolamide-timolol (COSOPT) 22.3-6.8 MG/ML ophthalmic solution USE ONE DROP IN LEFT EYE TWICE DAILY     enalapril (VASOTEC) 20 MG tablet Take 20 mg by mouth 2 (two) times daily.     escitalopram (LEXAPRO) 5 MG tablet Take 10 mg by mouth daily.     fish oil-omega-3 fatty acids 1000 MG capsule Take 1 g by mouth 2 (two) times daily.     furosemide (LASIX) 40 MG tablet Take 1.5 tablets (60 mg total) by mouth daily. 135 tablet 3   isosorbide mononitrate (IMDUR) 30 MG 24 hr tablet Take 30 mg by mouth daily.     memantine (NAMENDA) 5 MG tablet Take 5 mg by mouth 2 (two) times daily.     metFORMIN (GLUCOPHAGE) 500 MG tablet Take 500 mg by mouth daily with breakfast.     metoprolol succinate (TOPROL-XL) 50 MG 24 hr tablet Take 50 mg by mouth daily.     Multiple Vitamin (MULTIVITAMIN WITH MINERALS) TABS tablet Take 1 tablet by mouth daily.     nitroGLYCERIN (NITROSTAT) 0.4 MG SL tablet Place 1 tablet (0.4 mg total) under the tongue every 5 (five) minutes x 3 doses as needed for  chest pain. 25 tablet 3   potassium chloride SA (KLOR-CON M) 20 MEQ tablet Take 2 tablets (40 mEq total) by mouth daily. 180 tablet 3   tamsulosin (FLOMAX) 0.4 MG CAPS capsule Take 0.4 mg by mouth daily.      traMADol (ULTRAM) 50 MG tablet Take 50 mg by mouth every 6 (six) hours as needed.      No current facility-administered medications for this visit.     Past Surgical History:  Procedure Laterality Date   CARDIAC CATHETERIZATION  1998   CATARACT EXTRACTION W/ INTRAOCULAR LENS  IMPLANT, BILATERAL  2010   LEFT AND RIGHT HEART CATHETERIZATION WITH CORONARY ANGIOGRAM N/A 09/11/2013   Procedure: LEFT AND RIGHT HEART CATHETERIZATION WITH CORONARY ANGIOGRAM;  Surgeon: Iran Ouch, MD;  Location: [facility] CATH LAB;  Service: Cardiovascular;  Laterality: N/A;   PERMANENT PACEMAKER INSERTION N/A 09/13/2013   Procedure: PERMANENT PACEMAKER INSERTION;  Surgeon: Marinus Maw, MD;  Location: [facility] CATH LAB;  Service: Cardiovascular;  Laterality: N/A;   right hip fx  05/26/2016   right wrist fx  05/26/2016   STOMACH SURGERY  03/02/2012   ?volvus; "stomach came loose and twisted; went up under rib cage; had that corrected; tacked it up" (09/10/2013)     Allergies  Allergen Reactions   Fentanyl Other (See Comments)    resp suppression per wife, out for 2 days       Family History  Problem Relation Age of Onset   Hypertension Father    Hypertension Mother      Social History Mr. Gudiel reports that he has never smoked. He has never used smokeless tobacco. Mr. Licea reports no history of alcohol use.   Review of Systems CONSTITUTIONAL: No weight loss, fever, chills, weakness or fatigue.  HEENT: Eyes: No visual loss, blurred vision, double vision or yellow sclerae.No hearing loss, sneezing, congestion, runny nose or sore throat.  SKIN: No rash or itching.  CARDIOVASCULAR: per hpi RESPIRATORY: No shortness of breath, cough or sputum.  GASTROINTESTINAL: No anorexia, nausea, vomiting or  diarrhea. No abdominal pain or blood.  GENITOURINARY: No burning on urination, no polyuria NEUROLOGICAL: No headache, dizziness, syncope, paralysis, ataxia, numbness or tingling in the extremities. No change in bowel or bladder control.  MUSCULOSKELETAL: No muscle, back pain, joint pain or stiffness.  LYMPHATICS: No enlarged nodes. No history of splenectomy.  PSYCHIATRIC: No history of depression or anxiety.  ENDOCRINOLOGIC: No reports of sweating, cold or heat intolerance. No polyuria or polydipsia.  Marland Kitchen   Physical Examination Today's Vitals   11/08/23 1053  BP: 108/62  Pulse: 66  SpO2: 95%  Weight: 142 lb 6.4 oz (64.6 kg)  Height: 6' (1.829 m)   Body mass index is 19.31 kg/m.  Gen: resting comfortably, no acute distress HEENT: no scleral icterus, pupils equal round and reactive, no palptable cervical adenopathy,  CV: RRR, no m/rg, no jvd Resp: Clear to auscultation bilaterally GI: abdomen is soft, non-tender, non-distended, normal bowel sounds, no hepatosplenomegaly MSK: extremities are warm, no edema.  Skin: warm, no rash Neuro:  no focal deficits Psych: appropriate affect   Diagnostic Studies  Cardiac Catheterization 09/21/2013   Procedural Findings:   Hemodynamics   RA 3 mmHg   RV 37/1 mmHg   PA 33/12 mmHg   PCWP 6 mmHg   LV 201/4 mHg . LVEDP: 12 mmHg   AO 202/94 mmHg   Oxygen saturations:   PA 74%   AO 94%   Cardiac Output (Fick) 6.72   Cardiac Index (Fick) 3.22   Pulmonary vascular resistance (PVR): 1.9 Woods units.   Coronary angiography:   Coronary dominance: right   Left Main: Normal in size with no significant disease.   Left Anterior Descending (LAD): Large in size with mild diffuse atherosclerosis. There is diffuse 20% disease in the midsegment.   1st diagonal (D1): Normal in size with 80% ostial stenosis followed by a 95% proximal stenosis.   2nd diagonal (D2): Medium in size with minor irregularities.   3rd diagonal (D3): Normal in size with no  significant disease.   Circumflex (LCx): Normal in size and nondominant. There is 20% mid stenosis.   1st obtuse marginal: Medium in size with minor irregularities.   2nd obtuse marginal: Large is in size with no significant disease.   3rd obtuse marginal: Normal in size with minor irregularities.   Right Coronary Artery: large in size and dominant. There is 20% stenosis distally.   Posterior descending artery: normal in size with no significant disease.   Posterior AV segment: normal in size with no significant disease.   Posterolateral branchs: PL 1 is large with no significant disease. PL 2 is small. Left ventriculography: Left ventricular systolic function is normal , LVEF is estimated at 60 %, there is no significant mitral regurgitation     07/2012 Echo: mild LVH, LVEF 40-45%, LAE, mild MR,      Jan 2024 echo 1. Left ventricular ejection fraction, by estimation, is 55 to 60%. The  left ventricle has normal function. The left ventricle has no regional  wall motion abnormalities. There is mild left ventricular hypertrophy.  Left ventricular diastolic parameters  are indeterminate.   2. Right ventricular systolic function is normal. The right ventricular  size is normal.   3. Left atrial size was severely dilated.   4. Right atrial size was mildly dilated.   5. Mild mitral valve regurgitation.   6. Tricuspid valve regurgitation is mild to moderate.   7. The aortic valve is tricuspid. Aortic valve regurgitation is mild.  Aortic valve sclerosis/calcification is present, without any evidence of  aortic stenosis.      Assessment and Plan    1. HFimpEF - LVEF has since normalized, still with some diastolic dysfunction -. Due to chronic orthostatic symptoms have been cautious with diuretics -euvolemic today,  continue current meds   2. CAD   - patent major arteries with evidence of disease in diags and OMs from prior cath -no symptoms - continue current meds, should be on statin  unclear what happened to his pravastatin. Start back 20mg  daily.    3. HTN   - accepting high bp's for him at this time due to prior dizziness - bp at goal, continue current meds   4. Bradycardia/Pacemaker - followed by EP -recent normal device check, continue to monitor.      5. Afib/acquired thrombophilia - no symptoms, continue current meds - continue eliquis, some weight loss if gets below 60kg will need to lower his eliquis dosing  F/u 6 months    Antoine Poche, M.D.

## 2023-12-04 DIAGNOSIS — I4891 Unspecified atrial fibrillation: Secondary | ICD-10-CM | POA: Diagnosis not present

## 2023-12-04 DIAGNOSIS — F329 Major depressive disorder, single episode, unspecified: Secondary | ICD-10-CM | POA: Diagnosis not present

## 2023-12-04 DIAGNOSIS — J984 Other disorders of lung: Secondary | ICD-10-CM | POA: Diagnosis not present

## 2023-12-04 DIAGNOSIS — I509 Heart failure, unspecified: Secondary | ICD-10-CM | POA: Diagnosis not present

## 2023-12-05 DIAGNOSIS — R7989 Other specified abnormal findings of blood chemistry: Secondary | ICD-10-CM | POA: Diagnosis not present

## 2023-12-05 DIAGNOSIS — N390 Urinary tract infection, site not specified: Secondary | ICD-10-CM | POA: Diagnosis not present

## 2023-12-05 DIAGNOSIS — R9389 Abnormal findings on diagnostic imaging of other specified body structures: Secondary | ICD-10-CM | POA: Diagnosis not present

## 2023-12-05 DIAGNOSIS — R531 Weakness: Secondary | ICD-10-CM | POA: Diagnosis not present

## 2023-12-05 DIAGNOSIS — R5383 Other fatigue: Secondary | ICD-10-CM | POA: Diagnosis not present

## 2023-12-06 DIAGNOSIS — F0390 Unspecified dementia without behavioral disturbance: Secondary | ICD-10-CM | POA: Diagnosis not present

## 2023-12-06 DIAGNOSIS — F339 Major depressive disorder, recurrent, unspecified: Secondary | ICD-10-CM | POA: Diagnosis not present

## 2023-12-06 DIAGNOSIS — F5101 Primary insomnia: Secondary | ICD-10-CM | POA: Diagnosis not present

## 2023-12-08 DIAGNOSIS — B351 Tinea unguium: Secondary | ICD-10-CM | POA: Diagnosis not present

## 2023-12-08 DIAGNOSIS — E1159 Type 2 diabetes mellitus with other circulatory complications: Secondary | ICD-10-CM | POA: Diagnosis not present

## 2023-12-08 DIAGNOSIS — R4182 Altered mental status, unspecified: Secondary | ICD-10-CM | POA: Diagnosis not present

## 2023-12-08 DIAGNOSIS — R531 Weakness: Secondary | ICD-10-CM | POA: Diagnosis not present

## 2023-12-11 DIAGNOSIS — E870 Hyperosmolality and hypernatremia: Secondary | ICD-10-CM | POA: Diagnosis not present

## 2023-12-11 DIAGNOSIS — J189 Pneumonia, unspecified organism: Secondary | ICD-10-CM | POA: Diagnosis not present

## 2023-12-11 DIAGNOSIS — R6520 Severe sepsis without septic shock: Secondary | ICD-10-CM | POA: Diagnosis present

## 2023-12-11 DIAGNOSIS — E43 Unspecified severe protein-calorie malnutrition: Secondary | ICD-10-CM | POA: Diagnosis not present

## 2023-12-11 DIAGNOSIS — R001 Bradycardia, unspecified: Secondary | ICD-10-CM | POA: Diagnosis not present

## 2023-12-11 DIAGNOSIS — Z7983 Long term (current) use of bisphosphonates: Secondary | ICD-10-CM | POA: Diagnosis not present

## 2023-12-11 DIAGNOSIS — Z66 Do not resuscitate: Secondary | ICD-10-CM | POA: Diagnosis not present

## 2023-12-11 DIAGNOSIS — R0902 Hypoxemia: Secondary | ICD-10-CM | POA: Diagnosis not present

## 2023-12-11 DIAGNOSIS — Z681 Body mass index (BMI) 19 or less, adult: Secondary | ICD-10-CM | POA: Diagnosis not present

## 2023-12-11 DIAGNOSIS — Z79899 Other long term (current) drug therapy: Secondary | ICD-10-CM | POA: Diagnosis not present

## 2023-12-11 DIAGNOSIS — R7402 Elevation of levels of lactic acid dehydrogenase (LDH): Secondary | ICD-10-CM | POA: Diagnosis not present

## 2023-12-11 DIAGNOSIS — I110 Hypertensive heart disease with heart failure: Secondary | ICD-10-CM | POA: Diagnosis present

## 2023-12-11 DIAGNOSIS — A419 Sepsis, unspecified organism: Secondary | ICD-10-CM | POA: Diagnosis not present

## 2023-12-11 DIAGNOSIS — Z1152 Encounter for screening for COVID-19: Secondary | ICD-10-CM | POA: Diagnosis not present

## 2023-12-11 DIAGNOSIS — J9601 Acute respiratory failure with hypoxia: Secondary | ICD-10-CM | POA: Diagnosis not present

## 2023-12-11 DIAGNOSIS — F411 Generalized anxiety disorder: Secondary | ICD-10-CM | POA: Diagnosis not present

## 2023-12-11 DIAGNOSIS — Z7401 Bed confinement status: Secondary | ICD-10-CM | POA: Diagnosis not present

## 2023-12-11 DIAGNOSIS — R52 Pain, unspecified: Secondary | ICD-10-CM | POA: Diagnosis not present

## 2023-12-11 DIAGNOSIS — J9600 Acute respiratory failure, unspecified whether with hypoxia or hypercapnia: Secondary | ICD-10-CM | POA: Diagnosis not present

## 2023-12-11 DIAGNOSIS — R7989 Other specified abnormal findings of blood chemistry: Secondary | ICD-10-CM | POA: Diagnosis not present

## 2023-12-11 DIAGNOSIS — N3000 Acute cystitis without hematuria: Secondary | ICD-10-CM | POA: Diagnosis not present

## 2023-12-11 DIAGNOSIS — R0989 Other specified symptoms and signs involving the circulatory and respiratory systems: Secondary | ICD-10-CM | POA: Diagnosis not present

## 2023-12-11 DIAGNOSIS — E860 Dehydration: Secondary | ICD-10-CM | POA: Diagnosis not present

## 2023-12-11 DIAGNOSIS — Z8679 Personal history of other diseases of the circulatory system: Secondary | ICD-10-CM | POA: Diagnosis not present

## 2023-12-11 DIAGNOSIS — J984 Other disorders of lung: Secondary | ICD-10-CM | POA: Diagnosis not present

## 2023-12-11 DIAGNOSIS — R4182 Altered mental status, unspecified: Secondary | ICD-10-CM | POA: Diagnosis not present

## 2023-12-11 DIAGNOSIS — R0689 Other abnormalities of breathing: Secondary | ICD-10-CM | POA: Diagnosis not present

## 2023-12-11 DIAGNOSIS — Z792 Long term (current) use of antibiotics: Secondary | ICD-10-CM | POA: Diagnosis not present

## 2023-12-11 DIAGNOSIS — I509 Heart failure, unspecified: Secondary | ICD-10-CM | POA: Diagnosis not present

## 2023-12-11 DIAGNOSIS — E871 Hypo-osmolality and hyponatremia: Secondary | ICD-10-CM | POA: Diagnosis not present

## 2023-12-11 DIAGNOSIS — I482 Chronic atrial fibrillation, unspecified: Secondary | ICD-10-CM | POA: Diagnosis not present

## 2023-12-11 DIAGNOSIS — R918 Other nonspecific abnormal finding of lung field: Secondary | ICD-10-CM | POA: Diagnosis not present

## 2023-12-11 DIAGNOSIS — G311 Senile degeneration of brain, not elsewhere classified: Secondary | ICD-10-CM | POA: Diagnosis not present

## 2023-12-11 DIAGNOSIS — Z885 Allergy status to narcotic agent status: Secondary | ICD-10-CM | POA: Diagnosis not present

## 2023-12-11 DIAGNOSIS — I5030 Unspecified diastolic (congestive) heart failure: Secondary | ICD-10-CM | POA: Diagnosis present

## 2023-12-11 DIAGNOSIS — R404 Transient alteration of awareness: Secondary | ICD-10-CM | POA: Diagnosis not present

## 2023-12-11 DIAGNOSIS — J90 Pleural effusion, not elsewhere classified: Secondary | ICD-10-CM | POA: Diagnosis not present

## 2023-12-11 DIAGNOSIS — I4891 Unspecified atrial fibrillation: Secondary | ICD-10-CM | POA: Diagnosis not present

## 2023-12-11 DIAGNOSIS — E876 Hypokalemia: Secondary | ICD-10-CM | POA: Diagnosis not present

## 2023-12-11 DIAGNOSIS — Z9981 Dependence on supplemental oxygen: Secondary | ICD-10-CM | POA: Diagnosis not present

## 2023-12-11 DIAGNOSIS — G9341 Metabolic encephalopathy: Secondary | ICD-10-CM | POA: Diagnosis not present

## 2023-12-11 DIAGNOSIS — N179 Acute kidney failure, unspecified: Secondary | ICD-10-CM | POA: Diagnosis not present

## 2023-12-27 DEATH — deceased

## 2024-01-02 ENCOUNTER — Telehealth: Payer: Self-pay

## 2024-01-02 NOTE — Telephone Encounter (Signed)
 I let the patient wife know she can call [employer] tech support to turn the monitor in. She states she is going to drop the monitor off at the [place] office.

## 2024-01-02 NOTE — Telephone Encounter (Signed)
 New Message:    Wife called, she said patient passed away on 20-Dec-2023. She needs to know what to do with is device.

## 2024-01-10 ENCOUNTER — Encounter (INDEPENDENT_AMBULATORY_CARE_PROVIDER_SITE_OTHER): Payer: Medicare Other | Admitting: Ophthalmology
